# Patient Record
Sex: Female | Born: 1940 | Race: White | Hispanic: No | Marital: Married | Smoking: Never smoker
Health system: Southern US, Community
[De-identification: ages and names within clinical notes are randomized; demographics above are authoritative.]

## PROBLEM LIST (undated history)

## (undated) DIAGNOSIS — E119 Type 2 diabetes mellitus without complications: Secondary | ICD-10-CM

## (undated) DIAGNOSIS — R42 Dizziness and giddiness: Secondary | ICD-10-CM

## (undated) DIAGNOSIS — E559 Vitamin D deficiency, unspecified: Secondary | ICD-10-CM

## (undated) DIAGNOSIS — I10 Essential (primary) hypertension: Secondary | ICD-10-CM

## (undated) DIAGNOSIS — R7303 Prediabetes: Secondary | ICD-10-CM

## (undated) DIAGNOSIS — F419 Anxiety disorder, unspecified: Secondary | ICD-10-CM

## (undated) DIAGNOSIS — R159 Full incontinence of feces: Secondary | ICD-10-CM

## (undated) DIAGNOSIS — R002 Palpitations: Secondary | ICD-10-CM

## (undated) DIAGNOSIS — K219 Gastro-esophageal reflux disease without esophagitis: Secondary | ICD-10-CM

## (undated) DIAGNOSIS — K589 Irritable bowel syndrome without diarrhea: Secondary | ICD-10-CM

## (undated) DIAGNOSIS — E739 Lactose intolerance, unspecified: Secondary | ICD-10-CM

## (undated) DIAGNOSIS — A0472 Enterocolitis due to Clostridium difficile, not specified as recurrent: Secondary | ICD-10-CM

## (undated) HISTORY — DX: Irritable bowel syndrome, unspecified: K58.9

## (undated) HISTORY — DX: Palpitations: R00.2

## (undated) HISTORY — PX: ABDOMINAL HYSTERECTOMY: SUR658

## (undated) HISTORY — DX: Anxiety disorder, unspecified: F41.9

## (undated) HISTORY — PX: CHOLECYSTECTOMY: SHX55

## (undated) HISTORY — DX: Full incontinence of feces: R15.9

## (undated) HISTORY — DX: Type 2 diabetes mellitus without complications: E11.9

## (undated) HISTORY — PX: ABDOMINAL HYSTERECTOMY: SHX81

## (undated) HISTORY — DX: Essential (primary) hypertension: I10

## (undated) HISTORY — PX: KNEE ARTHROSCOPY: SHX127

## (undated) HISTORY — DX: Enterocolitis due to Clostridium difficile, not specified as recurrent: A04.72

## (undated) SURGERY — COLONOSCOPY WITH PROPOFOL
Anesthesia: Monitor Anesthesia Care

---
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
## 1998-03-19 ENCOUNTER — Ambulatory Visit (HOSPITAL_COMMUNITY): Admission: RE | Admit: 1998-03-19 | Discharge: 1998-03-19 | Payer: Self-pay | Admitting: *Deleted

## 1998-10-16 ENCOUNTER — Other Ambulatory Visit: Admission: RE | Admit: 1998-10-16 | Discharge: 1998-10-16 | Payer: Self-pay | Admitting: *Deleted

## 1999-03-29 ENCOUNTER — Encounter (INDEPENDENT_AMBULATORY_CARE_PROVIDER_SITE_OTHER): Payer: Self-pay | Admitting: Specialist

## 1999-03-29 ENCOUNTER — Ambulatory Visit (HOSPITAL_COMMUNITY): Admission: RE | Admit: 1999-03-29 | Discharge: 1999-03-29 | Payer: Self-pay | Admitting: *Deleted

## 1999-09-16 ENCOUNTER — Ambulatory Visit (HOSPITAL_COMMUNITY): Admission: RE | Admit: 1999-09-16 | Discharge: 1999-09-16 | Payer: Self-pay | Admitting: *Deleted

## 1999-09-16 ENCOUNTER — Encounter: Payer: Self-pay | Admitting: *Deleted

## 1999-10-16 ENCOUNTER — Other Ambulatory Visit: Admission: RE | Admit: 1999-10-16 | Discharge: 1999-10-16 | Payer: Self-pay | Admitting: *Deleted

## 2000-10-06 ENCOUNTER — Other Ambulatory Visit: Admission: RE | Admit: 2000-10-06 | Discharge: 2000-10-06 | Payer: Self-pay | Admitting: *Deleted

## 2001-01-06 ENCOUNTER — Ambulatory Visit (HOSPITAL_COMMUNITY): Admission: RE | Admit: 2001-01-06 | Discharge: 2001-01-06 | Payer: Self-pay | Admitting: *Deleted

## 2001-01-06 ENCOUNTER — Encounter: Payer: Self-pay | Admitting: *Deleted

## 2006-11-04 ENCOUNTER — Ambulatory Visit (HOSPITAL_COMMUNITY): Admission: RE | Admit: 2006-11-04 | Discharge: 2006-11-04 | Payer: Self-pay | Admitting: Family Medicine

## 2007-05-27 ENCOUNTER — Encounter (HOSPITAL_COMMUNITY): Admission: RE | Admit: 2007-05-27 | Discharge: 2007-06-01 | Payer: Self-pay | Admitting: Family Medicine

## 2007-07-16 ENCOUNTER — Ambulatory Visit: Payer: Self-pay | Admitting: Cardiology

## 2008-07-19 ENCOUNTER — Ambulatory Visit (HOSPITAL_COMMUNITY): Admission: RE | Admit: 2008-07-19 | Discharge: 2008-07-19 | Payer: Self-pay | Admitting: Family Medicine

## 2008-09-01 HISTORY — PX: COLONOSCOPY: SHX174

## 2009-03-29 ENCOUNTER — Encounter: Payer: Self-pay | Admitting: Cardiology

## 2009-03-30 ENCOUNTER — Ambulatory Visit: Payer: Self-pay | Admitting: Cardiology

## 2009-04-05 ENCOUNTER — Telehealth: Payer: Self-pay | Admitting: Cardiology

## 2009-04-24 ENCOUNTER — Telehealth: Payer: Self-pay | Admitting: Cardiology

## 2009-04-30 DIAGNOSIS — R002 Palpitations: Secondary | ICD-10-CM | POA: Insufficient documentation

## 2009-05-01 ENCOUNTER — Ambulatory Visit: Payer: Self-pay | Admitting: Cardiology

## 2009-05-06 DIAGNOSIS — K589 Irritable bowel syndrome without diarrhea: Secondary | ICD-10-CM | POA: Insufficient documentation

## 2009-05-06 DIAGNOSIS — R0789 Other chest pain: Secondary | ICD-10-CM | POA: Insufficient documentation

## 2009-05-06 DIAGNOSIS — F411 Generalized anxiety disorder: Secondary | ICD-10-CM | POA: Insufficient documentation

## 2009-05-08 ENCOUNTER — Ambulatory Visit: Payer: Self-pay | Admitting: Cardiology

## 2009-05-11 ENCOUNTER — Encounter: Payer: Self-pay | Admitting: Cardiology

## 2009-07-09 ENCOUNTER — Ambulatory Visit (HOSPITAL_COMMUNITY): Admission: RE | Admit: 2009-07-09 | Discharge: 2009-07-09 | Payer: Self-pay | Admitting: General Surgery

## 2009-07-09 ENCOUNTER — Encounter (INDEPENDENT_AMBULATORY_CARE_PROVIDER_SITE_OTHER): Payer: Self-pay | Admitting: General Surgery

## 2009-09-24 ENCOUNTER — Ambulatory Visit: Payer: Self-pay | Admitting: Cardiology

## 2009-10-07 ENCOUNTER — Telehealth (INDEPENDENT_AMBULATORY_CARE_PROVIDER_SITE_OTHER): Payer: Self-pay | Admitting: Physician Assistant

## 2009-10-08 ENCOUNTER — Telehealth (INDEPENDENT_AMBULATORY_CARE_PROVIDER_SITE_OTHER): Payer: Self-pay | Admitting: *Deleted

## 2009-10-08 ENCOUNTER — Ambulatory Visit: Payer: Self-pay | Admitting: Cardiology

## 2009-10-09 ENCOUNTER — Encounter: Payer: Self-pay | Admitting: Cardiology

## 2010-06-25 ENCOUNTER — Ambulatory Visit: Payer: Self-pay | Admitting: Internal Medicine

## 2010-07-02 ENCOUNTER — Encounter: Payer: Self-pay | Admitting: Cardiology

## 2010-07-02 ENCOUNTER — Ambulatory Visit (HOSPITAL_COMMUNITY): Admission: RE | Admit: 2010-07-02 | Discharge: 2010-07-02 | Payer: Self-pay | Admitting: Internal Medicine

## 2010-07-24 ENCOUNTER — Ambulatory Visit (HOSPITAL_COMMUNITY): Admission: RE | Admit: 2010-07-24 | Discharge: 2010-07-24 | Payer: Self-pay | Admitting: Family Medicine

## 2010-09-19 ENCOUNTER — Ambulatory Visit
Admission: RE | Admit: 2010-09-19 | Discharge: 2010-09-19 | Payer: Self-pay | Source: Home / Self Care | Attending: Cardiology | Admitting: Cardiology

## 2010-09-19 ENCOUNTER — Encounter: Payer: Self-pay | Admitting: Cardiology

## 2010-09-21 ENCOUNTER — Encounter: Payer: Self-pay | Admitting: *Deleted

## 2010-10-03 ENCOUNTER — Encounter: Payer: Self-pay | Admitting: Cardiology

## 2010-10-03 NOTE — Progress Notes (Signed)
  Phone Note Call from Patient   Call For: Dr Lewayne Bunting Reason for Call: Talk to Doctor Summary of Call: Pt had dizzyness upon standing up. Sounds orthostatic but pt has eaten and denies decreased fluid intake. She has not had Propanolol today. Checked her BP/HR and SBP 153 w/ HR 70s. Advised her she could take Rx early and recheck BP in 3 hours - call back if no better. She has Lisinopril 10mg  tabs but has never taken them. Can use this if more BP control needed. Left msg w/ Eden to call her. Initial call taken by: Park Breed PA-C,  October 07, 2009 2:17 PM

## 2010-10-03 NOTE — Assessment & Plan Note (Signed)
Summary: 1 yr ful   Visit Type:  Follow-up Primary Provider:  Margo Common   History of Present Illness: Rhonda Andrews is a 70 year old female with history of palpitations and irritable bowel syndrome. Cardiac standpoint she is doing stable. At one point last year we've referred Rhonda Andrews to [facility] for evaluation of her IBS but she canceled Rhonda appointment because her symptoms temporarily improved. However now she is reporting again symptoms of frequent diarrhea abdominal bloating, tightness and pressure. She has been using Lomotil with moderate success. She has been evaluated with anti-endomysial antibodies and ruled out for celiac disease. Rhonda Andrews however reports to me that she has noted that dairy products dramatically worsened her diarrhea. Rhonda Andrews in Rhonda interim had an EGD done and colonoscopy as well as an abdominal CT scan all which were within normal limits. Unfortunately she has lost quite a bit of weight. She is wondering if she should go on a lactose-free diet. She has not had any formal testing for lactose intolerance.  From a cardiac standpoint she is actually doing quite well. She denies any chest pain shortness of breath orthopnea or PND.  Preventive Screening-Counseling & Management  Alcohol-Tobacco     Smoking Status: never  Current Medications (verified): 1)  Propranolol Hcl Cr 60 Mg Xr24h-Cap (Propranolol Hcl) .... Take 1 Tablet By Mouth Once A Day 2)  Caltrate 600+d Plus 600-400 Mg-Unit Tabs (Calcium Carbonate-Vit D-Min) .... Take 1 Tablet By Mouth Once A Day 3)  Childrens Multivitamins W/extra C & Fa Chew (Pediatric Multi Vit-Extra C-Fa) .... Take 2 Tablet By Mouth Once A Day 4)  Pantoprazole Sodium 40 Mg Tbec (Pantoprazole Sodium) .... Take 1 Tablet By Mouth Once A Day On Empty Stomach 5)  Diphenoxylate-Atropine 2.5-0.025 Mg/44ml Liqd (Diphenoxylate-Atropine) .... Take 2 Tablet By Mouth Two Times A Day As Needed  Allergies (verified): No Known Drug  Allergies  Comments:  Nurse/Medical Assistant: Rhonda Andrews's medication list and allergies were reviewed with Rhonda Andrews and were updated in Rhonda Medication and Allergy Lists.  Past History:  Past Medical History: Last updated: 09/24/2009 PALPITATIONS (ICD-785.1) irritable bowel syndrome status post cholecystectomy anxiety diarrhea  Family History: Last updated: 05/06/2009 noncontributory  Social History: Last updated: 04/30/2009 Tobacco Use - No.  Alcohol Use - no Drug Use - no Full Time Married  Regular Exercise - yes  Risk Factors: Smoking Status: never (09/19/2010)  Review of Systems       Rhonda Andrews complains of nausea and diarrhea.  Rhonda Andrews denies fatigue, malaise, fever, weight gain/loss, vision loss, decreased hearing, hoarseness, chest pain, palpitations, shortness of breath, prolonged cough, wheezing, sleep apnea, coughing up blood, abdominal pain, blood in stool, vomiting, heartburn, incontinence, blood in urine, muscle weakness, joint pain, leg swelling, rash, skin lesions, headache, fainting, dizziness, depression, anxiety, enlarged lymph nodes, easy bruising or bleeding, and environmental allergies.    Vital Signs:  Andrews profile:   70 year old female Height:      64 inches Weight:      131 pounds BMI:     22.57 Pulse rate:   61 / minute BP sitting:   124 / 77  (left arm) Cuff size:   regular  Vitals Entered By: Carlye Grippe (September 19, 2010 10:27 AM)  Physical Exam  Additional Exam:  General: somewhat cachectic appearing white female head: Normocephalic and atraumatic eyes PERRLA/EOMI intact, conjunctiva and lids normal nose: No deformity or lesions mouth normal dentition, normal posterior pharynx neck: Supple, no JVD.  No masses,  thyromegaly or abnormal cervical nodes lungs: Normal breath sounds bilaterally without wheezing.  Normal percussion heart: regular rate and rhythm with normal S1 and S2, no S3 or S4.  PMI is normal.  No  pathological murmurs abdomen: Normal bowel sounds, abdomen is soft and nontender without masses, organomegaly or hernias noted.  No hepatosplenomegaly musculoskeletal: Back normal, normal gait muscle strength and tone normal pulsus: Pulse is normal in all 4 extremities Extremities: No peripheral pitting edema neurologic: Alert and oriented x 3 skin: Intact without lesions or rashes cervical nodes: No significant adenopathy psychologic: Normal affect    EKG  Procedure date:  09/19/2010  Findings:      normal sinus rhythm left axis deviation nonspecific ST-T wave changes heart rate 57 beats per minute  Impression & Recommendations:  Problem # 1:  IBS (ICD-564.1) Rhonda Andrews has recurrent symptoms of significant diarrhea. Very products seem to make her symptoms worse. She has moderate improvement with Lomotil use. Is very likely that Rhonda Andrews has lactose intolerance and I have referred her for a lactose breathing test at [facility]. In addition she also needs to be screened for small bowel bacterial overgrowth given her symptomatology and she will be referred for hydrogen breath test/methane test. In Rhonda interim I recommended to Rhonda Andrews to try to start taking Iberogast.  Problem # 2:  CHEST PAIN, ATYPICAL (ICD-786.59) No recurrent chest pain. No further cardiac evaluation is indicated. EKG was reviewed and demonstrates sinus bradycardia otherwise no acute changes. Her updated medication list for this problem includes:    Propranolol Hcl Cr 60 Mg Xr24h-cap (Propranolol hcl) .Marland Kitchen... Take 1 tablet by mouth once a day  Orders: EKG w/ Interpretation (93000)  Problem # 3:  PALPITATIONS (ICD-785.1) quiesced on on her Pravachol therapy and given a refill for this Andrews Her updated medication list for this problem includes:    Propranolol Hcl Cr 60 Mg Xr24h-cap (Propranolol hcl) .Marland Kitchen... Take 1 tablet by mouth once a day  Orders: EKG w/ Interpretation (93000)  Andrews  Instructions: 1)  Hydrogen breath test & lactose breath test at [facility] first available 2)  Follow up in  3 months

## 2010-10-03 NOTE — Assessment & Plan Note (Signed)
Summary: 3 MO FU PER DEC REMINDER-SRS   Visit Type:  Follow-up Primary Provider:  Margo Common  CC:  follow-up visit.  History of Present Illness: the patient is a 70 year old female with a history of palpitations and irritable bowel syndrome.  From a cardiac standpoint she is stable.  She reports no recurrent palpitations, shortness of breath orthopnea or PND.  Due to ongoing diarrhea and some abdominal pain the patient underwent cholecystectomy but without improvement in her symptoms.  She continues to have significant diarrhea.  She has been started on cholestyramine.  She is questioning whether she could take propranolol together with cholestyramine.  Clinical Review Panels:  CXR CXR results There is hyperinflation of the lungs compatible with         COPD.  There is mild cardiomegaly.  No focal airspace opacities or         effusions.  No acute bony abnormality.                   IMPRESSION:         Mild COPD, cardiomegaly.                   No acute findings. (03/29/2009)    Preventive Screening-Counseling & Management  Alcohol-Tobacco     Smoking Status: never  Current Medications (verified): 1)  Propranolol Hcl Cr 60 Mg Xr24h-Cap (Propranolol Hcl) .... Take 1 Tablet By Mouth Once A Day 2)  Multi Mega Minerals  Tabs (Multiple Minerals-Vitamins) .... Take 1 Tablet By Mouth Two Times A Day 3)  Cephalexin 500 Mg Caps (Cephalexin) .... Take 1 Tablet By Mouth Three Times A Day 4)  Daily Vitamins For Women  Tabs (Multiple Vitamins-Calcium) .... Take 1 Tablet By Mouth Once A Day  Allergies (verified): No Known Drug Allergies  Comments:  Nurse/Medical Assistant: The patient's medications and allergies were reviewed with the patient and were updated in the Medication and Allergy Lists. bottles reviewed.  Past History:  Past Medical History: PALPITATIONS (ICD-785.1) irritable bowel syndrome status post cholecystectomy anxiety diarrhea  Review of Systems       The patient  complains of diarrhea.  The patient denies fatigue, malaise, fever, weight gain/loss, vision loss, decreased hearing, hoarseness, chest pain, palpitations, shortness of breath, prolonged cough, wheezing, sleep apnea, coughing up blood, abdominal pain, blood in stool, nausea, vomiting, heartburn, incontinence, blood in urine, muscle weakness, joint pain, leg swelling, rash, skin lesions, headache, fainting, dizziness, depression, anxiety, enlarged lymph nodes, easy bruising or bleeding, and environmental allergies.    Vital Signs:  Patient profile:   70 year old female Height:      64 inches Weight:      136 pounds Pulse rate:   73 / minute BP sitting:   127 / 83  (left arm) Cuff size:   regular  Vitals Entered By: Carlye Grippe (September 24, 2009 9:19 AM) CC: follow-up visit   Physical Exam  Additional Exam:  General: Well-developed, well-nourished in no distress head: Normocephalic and atraumatic eyes PERRLA/EOMI intact, conjunctiva and lids normal nose: No deformity or lesions mouth normal dentition, normal posterior pharynx neck: Supple, no JVD.  No masses, thyromegaly or abnormal cervical nodes lungs: Normal breath sounds bilaterally without wheezing.  Normal percussion heart: regular rate and rhythm with normal S1 and S2, no S3 or S4.  PMI is normal.  No pathological murmurs abdomen: Normal bowel sounds, abdomen is soft and nontender without masses, organomegaly or hernias noted.  No hepatosplenomegaly musculoskeletal: Back normal,  normal gait muscle strength and tone normal pulsus: Pulse is normal in all 4 extremities Extremities: No peripheral pitting edema neurologic: Alert and oriented x 3 skin: Intact without lesions or rashes cervical nodes: No significant adenopathy psychologic: Normal affect    Impression & Recommendations:  Problem # 1:  IBS (ICD-564.1) the patient continues to have symptoms consistent with irritable bowel syndrome.  She continues to have  diarrhea.  She had no improvement in her symptoms after cholecystectomy.  I will refer to patient to the functional bowel disease clinic at [facility] with Dr. Almyra Deforest Orders: Misc. Referral (Misc. Ref)  Problem # 2:  CHEST PAIN, ATYPICAL (ICD-786.59) the patient has atypical chest pain.  There is no evidence of angina.  At the present time no ischemia workup is needed. Her updated medication list for this problem includes:    Propranolol Hcl Cr 60 Mg Xr24h-cap (Propranolol hcl) .Marland Kitchen... Take 1 tablet by mouth once a day  Problem # 3:  PALPITATIONS (ICD-785.1)  Her updated medication list for this problem includes:    Propranolol Hcl Cr 60 Mg Xr24h-cap (Propranolol hcl) .Marland Kitchen... Take 1 tablet by mouth once a day  Problem # 4:  ANXIETY (ICD-300.00)  Patient Instructions: 1)  Referral to Dr. Almyra Deforest - GI Clinic 2)  Follow up in  1 year.   Prescriptions: PROPRANOLOL HCL CR 60 MG XR24H-CAP (PROPRANOLOL HCL) Take 1 tablet by mouth once a day  #30 x 6   Entered by:   Hoover Brunette, LPN   Authorized by:   Lewayne Bunting, MD, [facility]   Signed by:   Hoover Brunette, LPN on 04/54/0981   Method used:   Electronically to        [facility]* (retail)       [address]       [place], [place]  [address]       Ph: [phone]       Fax: [phone]   RxID:   [id number]

## 2010-10-03 NOTE — Letter (Signed)
Summary: External Correspondence/ REFERRAL [facility]  External Correspondence/ RE[facility]ERRAL [facility]   Imported By: Dorise Hiss 11/02/2009 12:35:42  _____________________________________________________________________  External Attachment:    Type:   Image     Comment:   External Document

## 2010-10-03 NOTE — Assessment & Plan Note (Signed)
Summary: bp check  Nurse Visit  See phone note regarding dizziness.   Vital Signs:  Patient profile:   70 year old female Height:      64 inches Weight:      134 pounds Pulse rate:   82 / minute Pulse (ortho):   92 / minute BP sitting:   137 / 77  (left arm) BP standing:   136 / 82 Cuff size:   regular  Vitals Entered By: Carlye Grippe (October 08, 2009 1:11 PM)  CC:Follow-up HTN--no BJY:NWGNFA meds?--yes Side effects?--no Chest pain, SOB, Dizziness?--c/o lightheadedness A/P: 1. HTN (401.1)             At goal?              If no, physician will be notified.              Follow up in ...Marland KitchenMarland KitchenMarland Kitchen  5 minutes was spent with the patient.     Serial Vital Signs/Assessments:  Time      Position  BP       Pulse  Resp  Temp     By 1:58 PM   Lying LA  124/80   74                    Lydia Anderson 1:58 PM   Sitting   129/85   88                    Lydia Anderson 1:58 PM   Standing  136/82   92                    Lydia Anderson 2:00pm    Standing  132/86   96                    Carlye Grippe 2:05pm    Standing  122/83   103                   Carlye Grippe   Visit Type:  Follow-up  CC:  nurse BP check per Theodore Demark for lightheadedness.  CC: nurse BP check per Theodore Demark for lightheadedness   Preventive Screening-Counseling & Management  Alcohol-Tobacco     Smoking Status: never  Allergies: No Known Drug Allergies  Orders Added: 1)  Est. Patient Level I [21308]  Patient is orthostatic, please encourage fluid intake with Gatorade. Recheck orthostatics in a week. Lewayne Bunting, MD, [facility]  October 12, 2009 8:40 AM   Patient notified.   States she did go to PMD also and he told her that she was dehydrated due to everything she eats going right through her.  Did advise to take some type of powder that GI MD had recommended.  Did encourage her to drink G-2 gatorade to help replace electrolytes.  States she will be seeing GI MD soon at [facility] for her IBS.   Also, states that she will call back if feels like she needs to come back for orthostatics.  Hoover Brunette, LPN  October 12, 2009 3:00 PM

## 2010-10-03 NOTE — Progress Notes (Signed)
Summary: PATIENT DIZZY  Phone Note Other Incoming   Caller: RHONDA BARETT Summary of Call: RHONDA LEFT A MESSAGE ON OUR ANSWERING MACHINE TO CALL PATIENT.  SHE IS CONCERNED ABOUT HER BEING DIZZY Initial call taken by: Claudette Laws,  October 08, 2009 8:11 AM  Follow-up for Phone Call        schedule RV visit for orthostatics. if negative, probably should be addressed by LMD.  Lewayne Bunting, MD, [facility]  October 08, 2009 9:13 AM   Additional Follow-up for Phone Call Additional follow up Details #1::        Done by Isabelle Course today. Please review and advise. Additional Follow-up by: Cyril Loosen, RN, BSN,  October 08, 2009 4:56 PM

## 2010-10-09 NOTE — Letter (Signed)
Summary: External Correspondence/ FAXED [facility] REFERRAL  External Correspondence/ [facility]ED [facility] REFERRAL   Imported By: Dorise Hiss 10/03/2010 11:09:12  _____________________________________________________________________  External Attachment:    Type:   Image     Comment:   External Document

## 2010-10-09 NOTE — Miscellaneous (Signed)
Summary: Orders Update - [facility]   Clinical Lists Changes  Orders: Added new Referral order of Misc. Referral (Misc. Ref) - Signed

## 2010-10-23 ENCOUNTER — Encounter: Payer: Self-pay | Admitting: Cardiology

## 2010-11-06 ENCOUNTER — Telehealth: Payer: Self-pay | Admitting: *Deleted

## 2010-11-13 ENCOUNTER — Ambulatory Visit (INDEPENDENT_AMBULATORY_CARE_PROVIDER_SITE_OTHER): Payer: Medicare Other | Admitting: Cardiology

## 2010-11-13 ENCOUNTER — Encounter: Payer: Self-pay | Admitting: Cardiology

## 2010-11-13 DIAGNOSIS — R002 Palpitations: Secondary | ICD-10-CM

## 2010-11-13 DIAGNOSIS — K589 Irritable bowel syndrome without diarrhea: Secondary | ICD-10-CM

## 2010-11-19 NOTE — Assessment & Plan Note (Signed)
Summary: 3 MO F/U FH   Visit Type:  Follow-up Primary Provider:  Margo Common   History of Present Illness: The patient is a 70 year old female with history of palpitation and irritable bowel syndrome.  However the last visit she complained again of recurrent symptoms of diarrhea abdominal bloating tightness and pressure.  She had been using Lomotil with moderate success.  She also had an EGD done on colonoscopy as well as an abdominal CT scan.  She has lost quite a bit of weight. The patient actually turned out to a positive hydrogen breath test for bacterial overgrowth.  The patient also had a GI motility test done.  I do not see the results on the lactose intolerance test.  The patient presents for follow-up.   Preventive Screening-Counseling & Management  Alcohol-Tobacco     Smoking Status: never  Current Medications (verified): 1)  Propranolol Hcl Cr 60 Mg Xr24h-Cap (Propranolol Hcl) .... Take 1 Tablet By Mouth Once A Day 2)  Caltrate 600+d Plus 600-400 Mg-Unit Tabs (Calcium Carbonate-Vit D-Min) .... Take 1 Tablet By Mouth Once A Day 3)  Childrens Multivitamins W/extra C & Fa Chew (Pediatric Multi Vit-Extra C-Fa) .... Take 2 Tablet By Mouth Once A Day 4)  Pantoprazole Sodium 40 Mg Tbec (Pantoprazole Sodium) .... Take 1 Tablet By Mouth Once A Day On Empty Stomach 5)  Diphenoxylate-Atropine 2.5-0.025 Mg/13ml Liqd (Diphenoxylate-Atropine) .... Take 2 Tablet By Mouth Two Times A Day As Needed 6)  Xifaxan 550 Mg Tabs (Rifaximin) .... Take 1 Tablet By Mouth Three Times A Day X 2 Weeks 7)  Augmentin 875-125 Mg Tabs (Amoxicillin-Pot Clavulanate) .... Take 1 Tablet By Mouth Two Times A Day X 10 8)  Flagyl 500 Mg Tabs (Metronidazole) .... Take 1 Tablet By Mouth Two Times A Day X 10 Days  Allergies (verified): No Known Drug Allergies  Comments:  Nurse/Medical Assistant: The patient's medication list and allergies were reviewed with the patient and were updated in the Medication and Allergy  Lists.  Past History:  Past Medical History: Last updated: 09/24/2009 PALPITATIONS (ICD-785.1) irritable bowel syndrome status post cholecystectomy anxiety diarrhea  Family History: Last updated: 05/06/2009 noncontributory  Social History: Last updated: 04/30/2009 Tobacco Use - No.  Alcohol Use - no Drug Use - no Full Time Married  Regular Exercise - yes  Risk Factors: Smoking Status: never (11/13/2010)  Review of Systems       The patient complains of diarrhea.  The patient denies fatigue, malaise, fever, weight gain/loss, vision loss, decreased hearing, hoarseness, chest pain, palpitations, shortness of breath, prolonged cough, wheezing, sleep apnea, coughing up blood, abdominal pain, blood in stool, nausea, vomiting, heartburn, incontinence, blood in urine, muscle weakness, joint pain, leg swelling, rash, skin lesions, headache, fainting, dizziness, depression, anxiety, enlarged lymph nodes, easy bruising or bleeding, and environmental allergies.         bloating and cramping as well as diarrhea  Vital Signs:  Patient profile:   70 year old female Height:      64 inches Weight:      133 pounds Pulse rate:   61 / minute BP sitting:   145 / 78  (left arm) Cuff size:   regular  Vitals Entered By: Carlye Grippe (November 13, 2010 8:36 AM)  Physical Exam  Additional Exam:  General: somewhat cachectic appearing white female head: Normocephalic and atraumatic eyes PERRLA/EOMI intact, conjunctiva and lids normal nose: No deformity or lesions mouth normal dentition, normal posterior pharynx neck: Supple, no JVD.  No masses,  thyromegaly or abnormal cervical nodes lungs: Normal breath sounds bilaterally without wheezing.  Normal percussion heart: regular rate and rhythm with normal S1 and S2, no S3 or S4.  PMI is normal.  No pathological murmurs abdomen: Normal bowel sounds, abdomen is soft and nontender without masses, organomegaly or hernias noted.  No  hepatosplenomegaly musculoskeletal: Back normal, normal gait muscle strength and tone normal pulsus: Pulse is normal in all 4 extremities Extremities: No peripheral pitting edema neurologic: Alert and oriented x 3 skin: Intact without lesions or rashes cervical nodes: No significant adenopathy psychologic: Normal affect    Impression & Recommendations:  Problem # 1:  IBS (ICD-564.1) IBS: Associated with diarrhea bloating and cramping. positive hydrogen breath test: The patient has a positive hydrogen breath test.  She will be treated with either rifaximin or Augmentin in combination with Flagyl.  She will check with her insurance company. rule out lactose intolerance: The patient has lactose intolerance and does appear to lactate free diet but she remains symptomatic.  Problem # 2:  PALPITATIONS (ICD-785.1) resolved Her updated medication list for this problem includes:    Propranolol Hcl Cr 60 Mg Xr24h-cap (Propranolol hcl) .Marland Kitchen... Take 1 tablet by mouth once a day  Patient Instructions: 1)  Rifaximin 550mg  three times a day x 2 weeks  2)  If insurance will not approve above medication, may take Augmentin 875/125 every 12 hours x 10 days & Flagyl 500mg  every 12 hours x 10 days 3)  Follow up in  6 months Prescriptions: FLAGYL 500 MG TABS (METRONIDAZOLE) Take 1 tablet by mouth two times a day x 10 days  #20 x 0   Entered by:   Hoover Brunette, LPN   Authorized by:   Lewayne Bunting, MD, [facility]   Signed by:   Hoover Brunette, LPN on 11/91/4782   Method used:   Print then Give to Patient   RxID:   [id number] AUGMENTIN 875-125 MG TABS (AMOXICILLIN-POT CLAVULANATE) Take 1 tablet by mouth two times a day x 10  #20 x 0   Entered by:   Hoover Brunette, LPN   Authorized by:   Lewayne Bunting, MD, [facility]   Signed by:   Hoover Brunette, LPN on 29/52/8413   Method used:   Print then Give to Patient   RxID:   [id number] XIFAXAN 550 MG TABS (RIFAXIMIN) Take 1 tablet by mouth three times a day x 2 weeks  #45 x 0    Entered by:   Hoover Brunette, LPN   Authorized by:   Lewayne Bunting, MD, [facility]   Signed by:   Hoover Brunette, LPN on 34/74/2595   Method used:   Print then Give to Patient   RxID:   [id number]

## 2010-11-19 NOTE — Progress Notes (Signed)
Summary: [facility] results       Phone Note Outgoing Call Call back at cell:  [phone]   Summary of Call: Had motility testing 2 weeks ago - requesting test results.     Placed call to [facility] lab requesting results.  Initial call taken by: Hoover Brunette, LPN,  November 06, 2010 11:10 AM  Follow-up for Phone Call        Called again to request test results, advised them that patient does have OV with GD in the a.m.  [phone] Follow-up by: Hoover Brunette, LPN,  November 12, 2010 4:17 PM  Additional Follow-up for Phone Call Additional follow up Details #1::        Received this morning and discussed with patient during OV today.  Additional Follow-up by: Hoover Brunette, LPN,  November 13, 2010 9:43 AM

## 2010-11-19 NOTE — Letter (Signed)
Summary: External Correspondence/  [facility]   External Correspondence/  [facility]   Imported By: Dorise Hiss 11/13/2010 08:11:50  _____________________________________________________________________  External Attachment:    Type:   Image     Comment:   External Document

## 2010-11-25 ENCOUNTER — Other Ambulatory Visit: Payer: Self-pay | Admitting: *Deleted

## 2010-11-25 DIAGNOSIS — R002 Palpitations: Secondary | ICD-10-CM

## 2010-11-25 MED ORDER — PROPRANOLOL HCL 60 MG PO TABS: 60.0000 mg | ORAL_TABLET | Freq: Every day | ORAL | Status: DC

## 2010-11-27 ENCOUNTER — Telehealth: Payer: Self-pay | Admitting: *Deleted

## 2010-11-27 NOTE — Telephone Encounter (Signed)
Please file in med record  ---------- Forwarded message ---------- From: Peyton Bottoms [email or web address]> Date: Mon, Nov 25, 2010 at 2:22 PM Subject: Re: update on Rhonda Andrews Reason To: Johnny Bridge Patt [email or web address]>   Seira , thats very unusual, but yes stop the pills for now and schedule appointment with Dr. Karilyn Cota. I doubt this is related to pills, but possible.  Let me know how you feel in next few days. You can call me at [phone] also.  Alvin Critchley Gent,MD    On Mon, Nov 25, 2010 at 2:13 PM, Kasy Iannacone [email or web address]> wrote: Just wanted to update you on my taking Xifaxan. I was able to get 28 pills of which I take 2 a day. I began taking this Thursday night and I was doing just fine until today and I have been to the bathroom 4 times so far today. I read the side effects and was concerned. Do I keep taking or not? I called Dr. Karilyn Cota and could not get an appointment until Thursday morning. I felt they needed to know about the test and the results.  Sorry to bother you but please advise.  Thanks,  Rhonda Andrews Reason    --  Alvin Critchley [facility]  Board Certified in Internal Medicine, Adult Cardiovascular Medicine and Critical Care Medicine  The information in this electronic mail is sensitive, protected information  intended only for the addressee(s). Any other person, including anyone who  believes he/she might have received it due to an addressing error, is requested  to notify the sender immediately and delete it without further reading or  retention. The information is not to be forwarded or shared unless in  compliance with [employer] on confidentiality and/or with the approval of  the sender.    --  Alvin Critchley [facility]  Board Certified in Internal Medicine, Adult Cardiovascular Medicine and Critical Care Medicine  The information in this electronic mail is sensitive, protected information  intended only for the addressee(s). Any  other person, including anyone who  believes he/she might have received it due to an addressing error, is requested  to notify the sender immediately and delete it without further reading or  retention. The information is not to be forwarded or shared unless in  compliance with [employer] on confidentiality and/or with the approval of  the sender.

## 2010-11-28 ENCOUNTER — Ambulatory Visit (INDEPENDENT_AMBULATORY_CARE_PROVIDER_SITE_OTHER): Payer: Medicare Other | Admitting: Internal Medicine

## 2010-11-28 DIAGNOSIS — R197 Diarrhea, unspecified: Secondary | ICD-10-CM

## 2010-12-04 LAB — BASIC METABOLIC PANEL
BUN: 23 mg/dL (ref 6–23)
CO2: 32 mEq/L (ref 19–32)
Glucose, Bld: 108 mg/dL — ABNORMAL HIGH (ref 70–99)
Potassium: 4.6 mEq/L (ref 3.5–5.1)
Sodium: 140 mEq/L (ref 135–145)

## 2010-12-04 LAB — CBC
HCT: 40.8 % (ref 36.0–46.0)
Hemoglobin: 14 g/dL (ref 12.0–15.0)
MCHC: 34.3 g/dL (ref 30.0–36.0)
MCV: 91.4 fL (ref 78.0–100.0)
Platelets: 243 10*3/uL (ref 150–400)
RDW: 13.6 % (ref 11.5–15.5)

## 2010-12-04 LAB — HEPATIC FUNCTION PANEL
Bilirubin, Direct: 0.1 mg/dL (ref 0.0–0.3)
Indirect Bilirubin: 0.3 mg/dL (ref 0.3–0.9)
Total Bilirubin: 0.4 mg/dL (ref 0.3–1.2)

## 2010-12-10 NOTE — Consult Note (Signed)
  Rhonda Andrews, Rhonda Andrews            ACCOUNT NO.:  [id number]  MEDICAL RECORD NO.:  [id number]           PATIENT TYPE: AMB.  LOCATION: [place].                   [facility]  PHYSICIAN:  Lionel December, M.D.    DATE OF BIRTH:  02-10-1941  DATE:  11/29/2010.                                OFFICE VISIT.   REASON FOR CONSULTATION:  Followup/office visit.  Rhonda Andrews is a 70- year-old female presenting today for followup.  She was last seen in our office in October 2011.  She complained of having frequent bloating and occasional epigastric tenderness.  Her symptoms are usually worse at night.  On May 15, 2009, she underwent an EGD and colonoscopy. The EGD revealed a 3 tiny hyperplastic-appearing gastric polyp, otherwise normal EGD.  The colonoscopy revealed a left-sided diverticulitis, normal terminal ileum, small external hemorrhoids.  She actually saw Dr. Andee Andrews and she was referred to [facility] and underwent a hydrogen breath test.  The hydrogen breath test was positive for bacterial overgrowth.  She was started on Xifaxan 500 mg 2 tablets a day for 14 days.  She took 4 days of that and she developed diarrhea. Since stopping the Xifaxan, she states she has no diarrhea and no bloating.  She says she feels pretty good.  She is concerned about the Xifaxan.  At present, she denies any diarrhea or bloating.  Her appetite is good.  There has been no weight loss.  HOME MEDICATIONS:  Include: 1. Maghemite one a day. 2. Maghemite Mineral twice a day 3. Prilosec OTC one a day. 4. Inderal LA 60 mg a day. 5. Multivitamin a day.  OBJECTIVE:  VITAL SIGNS:  Her weight is 133, her previous weight was 129, her blood pressure is 118/70, her temperature is 96.6, her height is 5 feet 4 inches, and her pulse is 72. HEENT:  Her conjunctiva is pink.  Her sclerae anicteric.  Her thyroid is normal.  There is no cervical lymphadenopathy. LUNGS:  Clear. HEART:  Regular rate and  rhythm. EXTREMITIES:  There is no edema to his extremities. ABDOMEN:  Soft.  Bowel sounds are positive.  No masses.  ASSESSMENT:  Rhonda Andrews is a 70 year old female presenting with complaints of bloating and diarrhea, which is resolved.  She does have bacterial overgrowth concern confirmed with a lab test.  RECOMMENDATIONS:  I did advise her to continue with Xifaxan 1 twice a day for 10 more days and she will call with a progress report in 2 weeks.    ______________________________ Dorene Ar, NP   ______________________________ Lionel December, M.D.    TS/MEDQ  D:  11/29/2010  T:  11/30/2010  Job:  [id number]  Electronically Signed by Dorene Ar PA on 12/05/2010 09:17:10 AM Electronically Signed by Lionel December M.D. on 12/10/2010 11:34:48 PM

## 2011-01-14 NOTE — Assessment & Plan Note (Signed)
[facility]                          [facility]ICE NOTE   Rhonda Andrews, Rhonda Andrews                   MRN:          [id number]  DATE:07/16/2007                            DOB:          1940-10-15    REFERRING PHYSICIAN:  Wyvonnia Lora, M.D.   REASON FOR CONSULTATION:  Cardiovascular examination.   HISTORY OF PRESENT ILLNESS:  The patient is a pleasant 70 year old  female with no prior history of coronary artery disease.  The patient  has essentially a few cardiac risk factors.  She does have a family  history of heart disease, but does not smoke and does not have diabetes  or hypertension and has no significant dyslipidemia.   The patient essentially wants to be checked for a thorough  cardiovascular examination.  She did have a [facility] examination done  and was noted to have no significant peripheral vascular disease.   The patient states that she is somewhat hampered by bad knees.  She,  however, has no substernal chest pain or shortness of breath upon  exertion.  She denies any palpitations or syncope.  She does report  episodes of feeling weak and less energy which can last for several  hours.  She has received a workup for hypoglycemic spells.  She has  increased her carbohydrates and her diet and these episodes have  somewhat improved.  She denies any cardiovascular related symptoms.   ALLERGIES:  No known drug allergies.   MEDICATIONS:  Calcium and magnesium.   SOCIAL HISTORY:  The patient lives in [place].  She works for [employer].  She denies any tobacco use.   FAMILY HISTORY:  Her mother is alive but has cancer.  Father died from  Wagner's granulomatosis.  She has two sisters, one with seizure disorder  and committed suicide.  She has another sister that has no significant  medical problems.   REVIEW OF SYSTEMS:  As per HPI.  No nausea and vomiting.  No fevers or  chills.  No melena, dysuria, or frequency.  No  palpitations or syncope.  No myalgias.  Positive for arthralgias in both knees.   PHYSICAL EXAMINATION:  VITAL SIGNS:  Blood pressure 123/73, heart rate  75 beats per minute.  GENERAL:  A well-nourished white female in no acute distress.  HEENT:  Pupils are clear.  NECK:  Supple, normal carotid upstroke, and no carotid bruits.  LUNGS:  Clear breath sounds bilaterally.  HEART:  Regular rate and rhythm with normal S1 and S2.  No murmurs or  gallops.  ABDOMEN:  Soft and nontender.  No rebound or guarding with good bowel  sounds.  EXTREMITIES:  No cyanosis, clubbing, or edema.  NEUROLOGY:  The patient is alert and oriented and grossly nonfocal.   PROBLEM LIST:  1. Weakness ruled out for hypoglycemia.  2. Pretest probability for cardiovascular disease.  3. Negative workup for peripheral vascular disease by [facility].   PLAN:  The patient will be screened for underlying coronary artery  disease although she has a very low pretest probability for CAD.  I have  ordered a dobutamine echocardiographic study  which will be performed  later next week.   The patient can follow up with [place] on as-needed basis, but if the stress  study is negative, I do think she can further adhere to a healthy  lifestyle and receive her primary care by Dr. Margo Common.     Learta Codding, MD,FACC  Electronically Signed    GED/MedQ  DD: 07/16/2007  DT: 07/17/2007  Job #: [id number]   cc:   Wyvonnia Lora

## 2011-01-14 NOTE — Assessment & Plan Note (Signed)
[facility]                          [facility] NOTE   Rhonda Andrews, Rhonda Andrews                   MRN:          [id number]  DATE:03/30/2009                            DOB:          01-29-1941    REFERRING PHYSICIAN:  Wyvonnia Lora   HISTORY OF PRESENT ILLNESS:  The patient is a 70 year old female last  seen by me in November 2008.  The patient was recently seen in the  emergency room for palpitations.  The patient has thought that when she  went to [facility] in June 2010, she started having problems with  short episodes initially of her heart racing.  Since returning from the  beach also, she has developed diarrhea with approximately 3-4 bowel  movements a day each time shortly after a meal.  When questioning the  patient about this, actually her symptoms of diarrhea go back many years  to her mid 74s, and she has recurrence of this on a regular basis.  Her  last episode was approximately 2 years ago.  The patient states that  when she presented to the emergency room, she has just suddenly felt  palpitations, but no nausea or vomiting and no dizziness.  There was no  shortness of breath.  The patient got to the emergency room, an EKG was  obtained, which only showed sinus tachycardia.  The highest heart rate  that she obtained at home was 112 beats per minute.  She states that the  episodes last about 2-3 hours.  The ER recommended that she will be  followed up the next day in our clinic.  We have seen the patient again  in 2008 for workup of cardiovascular disease.  I recommended at that  time a dobutamine echo, but the patient never had this study done.   MEDICATIONS:  1. Calcium, unknown dose b.i.d.  2. Magnesium, unknown dose daily.  No other medications.   PHYSICAL EXAMINATION:  VITAL SIGNS:  Blood pressure 148/89, heart rate  103 beats per minute, temperature is 99, weight is 143 pounds.  GENERAL:  Well-nourished white female in  no apparent distress.  HEENT:  Pupils anisocoric.  Conjunctiva clear.  NECK:  Supple, normal carotid upstroke, and no carotid bruits.  LUNGS:  Clear breath sounds bilaterally.  HEART:  Regular rate and rhythm with normal S1 and S2.  No murmur, rubs,  or gallops.  Tachycardic.  ABDOMEN:  Soft.  EXTREMITIES:  No cyanosis, clubbing, or edema.  NEUROLOGIC:  The patient is alert, oriented, and grossly nonfocal.   PROBLEM LIST:  1. Palpitations.  2. Diarrhea.  3. Low pretest probability for cardiovascular disease.   PLAN:  1. The patient's palpitations could well represent episodes of      anxiety.  I do not think she has a primary arrhythmia, but she will      have a CardioNet monitor for 21 days applied.  2. If the CardioNet monitor is abnormal with significant arrhythmias,      then we will also proceed with an echocardiographic study.  I do      not think at this  present time this is required.  3. 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  She has had a      workup many, many years ago for this.  However, her symptomatology      of bloating and postprandial diarrhea is very consistent with this      diagnosis.  She has not been screened for celiac disease, however.  4. I recommended to the patient that she start Align tablets one by      mouth daily as well as Beano tablets, alpha-D-galactosidase 2      tablets before each meal with vegetables.  She will also take      dicyclomine 20 mg p.o. t.i.d. 30 minutes before a meal to avoid      postprandial symptoms.  I also recommend that milk of thistle      capsules that she can take 3 times a day for bloating.  5. We also started in light of her hypertension and I suspect a      significant anxiety component, I will start her on propranolol LA      60 mg once daily.  6. The patient will return after her CardioNet monitor has been      completed for further recommendations if any.     Learta Codding, MD,FACC  Electronically  Signed    GED/MedQ  DD: 03/30/2009  DT: 03/31/2009  Job #: [id number]   cc:   Wyvonnia Lora

## 2011-02-06 ENCOUNTER — Ambulatory Visit (INDEPENDENT_AMBULATORY_CARE_PROVIDER_SITE_OTHER): Payer: Medicare Other | Admitting: Internal Medicine

## 2011-02-06 DIAGNOSIS — R112 Nausea with vomiting, unspecified: Secondary | ICD-10-CM

## 2011-06-09 ENCOUNTER — Ambulatory Visit (INDEPENDENT_AMBULATORY_CARE_PROVIDER_SITE_OTHER): Payer: Medicare Other | Admitting: Cardiology

## 2011-06-09 ENCOUNTER — Encounter: Payer: Self-pay | Admitting: Cardiology

## 2011-06-09 VITALS — BP 123/75 | HR 89 | Resp 18 | Ht 64.0 in | Wt 134.0 lb

## 2011-06-09 DIAGNOSIS — R002 Palpitations: Secondary | ICD-10-CM

## 2011-06-09 DIAGNOSIS — K589 Irritable bowel syndrome without diarrhea: Secondary | ICD-10-CM

## 2011-06-09 NOTE — Assessment & Plan Note (Signed)
No recurrent palpitations. No further cardiac workup needed. Continue low-dose propranolol

## 2011-06-09 NOTE — Patient Instructions (Signed)
Continue all current medications. Your physician wants you to follow up in:  1 year.  You will receive a reminder letter in the mail one-two months in advance.  If you don't receive a letter, please call our office to schedule the follow up appointment   

## 2011-06-09 NOTE — Assessment & Plan Note (Signed)
Status post refax amine therapy with resolution of her abdominal symptoms including bloating and abdominal pain. Patient was given recommendation to follow a FODMAP diet.

## 2011-06-09 NOTE — Progress Notes (Signed)
History of present illness: The patient is a 70 year old female with a history of palpitations and irritable bowel syndrome. The patient had a positive breath test for bacterial overgrowth consistent with SIBO and also a positive lactose intolerance test . The patient was treated with refax amine and has done very well with resolution of her symptoms of bloating and abdominal pain. She also hears a lactose free diet. From a cardiac standpoint is doing quite well. She denies any recurrent palpitations. She denies any chest pains or shortness of breath. She reports no anxiety.   Allergies, family history and social history: As documented in chart and reviewed  Medications: Documented and reviewed in chart.  Past medical history: Reviewed and see problem list below   Review of systems:No nausea or vomiting, no fever or chills no melena hematochezia. No abdominal pain no bloating. No cardiac symptoms   Physical examination : Vital signs documented below General:Well-nourished white female in no distress  HEENT:Normal carotid upstroke no carotid bruits, no thyromegaly nonnodular thyroid. EOMI PERRLA  Lungs:Clear breath sounds bilaterally no wheezing  Heart:Regular rate and rhythm with normal S1-S2 no murmur rubs or gallops  Abdomen:Soft nontender no rebound or guarding and good bowel sounds  Extremity exam:No cyanosis or clubbing or edema  Neurologic:Alert and oriented grossly nonfocal  Psychiatric:Normal affect  Vascular exam:Normal distal pulses

## 2011-06-24 ENCOUNTER — Other Ambulatory Visit: Payer: Self-pay | Admitting: Cardiology

## 2011-06-24 NOTE — Telephone Encounter (Signed)
Patient requesting refill on the Refaxamine given to her a couple months back.  States stomach has been bothering her since last OV (10/8).  Has seen Dorene Ar, PA a couple times, but not since June.  Stated that her insurance would only pay for the 200mg , not the 550mg .  Advised her that message will be sent to MD for review, but he may advise follow up with GI.  She verbalized understanding.

## 2011-07-02 ENCOUNTER — Encounter (INDEPENDENT_AMBULATORY_CARE_PROVIDER_SITE_OTHER): Payer: Self-pay | Admitting: Internal Medicine

## 2011-07-02 ENCOUNTER — Ambulatory Visit (INDEPENDENT_AMBULATORY_CARE_PROVIDER_SITE_OTHER): Payer: Medicare Other | Admitting: Internal Medicine

## 2011-07-02 VITALS — BP 120/68 | HR 66 | Temp 97.6°F | Ht 64.0 in | Wt 131.4 lb

## 2011-07-02 DIAGNOSIS — E739 Lactose intolerance, unspecified: Secondary | ICD-10-CM

## 2011-07-02 DIAGNOSIS — R197 Diarrhea, unspecified: Secondary | ICD-10-CM

## 2011-07-02 NOTE — Progress Notes (Signed)
Subjective:     Patient ID: Rhonda Andrews, female   DOB: 03/01/1941, 70 y.o.   MRN: [id number]  HPI Rhonda Andrews is a 70 yr old female presenting today. She says she was doing great, until 3 weeks ago. She was eating pears and Cool Whip.as a snack. She at a large container of [employer].  Symp[toms lasted for a couple of weeks. . She says she began to have diarrhea.  She has a hx of bacterial  Overgrowth and lactose intolerance. She had a posiitive hydrogen breath test for bacterial overgrowth in February of this year at [facility]. She is symptom free. For the past 4 days she has had one stool day. No diarrhea. Appetite is good. No weight loss. No abdominal pain. No melena or bright red rectal bleeding. Her last colonoscopy was in 2010 by Dr. Karilyn Andrews which revealed left sided diverticulosis, normal terminal ileum, small external hemorrhoids. Review of Systems  See hpi     Current Outpatient Prescriptions  Medication Sig Dispense Refill  . Calcium Carbonate (CALTRATE 600 PO) Take by mouth.        . Cholecalciferol (VITAMIN D-3 PO) Take by mouth.        . Probiotic Product (ALIGN PO) Take by mouth.        . propranolol (INDERAL) 60 MG tablet TAKE (1) TABLET BY MOUTH ONCE DAILY.  30 tablet  6   History reviewed. No pertinent past surgical history. Past Medical History  Diagnosis Date  . Heart rate fast    No Known Allergies Family Status  Relation Status Death Age  . Mother Alive     Non-Hodgkins Lymphoma.  Colon Cancer  . Father Deceased     Teaching laboratory technician   . Sister Deceased     One deceased GSW. One in good health   History   Social History Narrative  . No narrative on file   History   Social History  . Marital Status: Married    Spouse Name: N/A    Number of Children: N/A  . Years of Education: N/A   Occupational History  . Not on file.   Social History Main Topics  . Smoking status: Never Smoker   . Smokeless tobacco: Not on file  . Alcohol Use: Not on file  . Drug Use:  Not on file  . Sexually Active: Not on file   Other Topics Concern  . Not on file   Social History Narrative  . No narrative on file    History   Social History  . Marital Status: Married    Spouse Name: N/A    Number of Children: N/A  . Years of Education: N/A   Occupational History  . Not on file.   Social History Main Topics  . Smoking status: Never Smoker   . Smokeless tobacco: Not on file  . Alcohol Use: No  . Drug Use: No  . Sexually Active: Not on file   Other Topics Concern  . Not on file   Social History Narrative  . No narrative on file    Objective:   Physical Exam Filed Vitals:   07/02/11 1537  BP: 120/68  Pulse: 66  Temp: 97.6 F (36.4 C)  Height: 5\' 4"  (1.626 m)  Weight: 131 lb 6.4 oz (59.603 kg)    Alert and oriented. Skin warm and dry. Oral mucosa is moist. Natural teeth in good condition. Sclera anicteric, conjunctivae is pink. Thyroid not enlarged. No cervical lymphadenopathy. Lungs clear.  Heart regular rate and rhythm.  Abdomen is soft. Bowel sounds are positive. No hepatomegaly. No abdominal masses felt. No tenderness.  No edema to lower extremities. Patient is alert and oriented.     Assessment:    Diarrhea which has now resolved. She has a hx of bacterial overgrowth by hydrogen breath test. She also tells me she is Lactose intolerant. Cool Whip does contain lactose. Suspect her diarrhea was from the [employer]. Her symptoms have now resolved.     Plan:    Try Lactaid Milk. Avoid foods with lactose. Increase fiber in diet.````````````````````````````````````````

## 2011-07-02 NOTE — Patient Instructions (Signed)
Avoid milk products containing lactose.

## 2011-07-06 NOTE — Telephone Encounter (Signed)
Repeat dosing/treatment with rifaximine for SIBO is indicated ( and she did have dramatic improvement with her initial treatment), but she really needs full dosing at 550 mG - this is reason, why she may have had relapse. I strongly suggest she goes and sees Dr. Karilyn Cota. He needs to make future decision and follow her for this problem.Also , her problems now may be of a different nature.  She needs a dedicated gastroenterologist. She did have a positive hydrogen breath test for SIBO and should qualify. Please schedule appointment with Dr. Annell Greening and forward him notes and Hydrogen Breath Test from [facility].

## 2011-08-05 NOTE — Telephone Encounter (Signed)
Patient has been seen in our office by Ms. Setzer

## 2011-08-14 ENCOUNTER — Other Ambulatory Visit (HOSPITAL_COMMUNITY): Payer: Self-pay | Admitting: Family Medicine

## 2011-08-14 DIAGNOSIS — Z1231 Encounter for screening mammogram for malignant neoplasm of breast: Secondary | ICD-10-CM

## 2011-09-05 NOTE — Telephone Encounter (Signed)
Patient was seen in our office by Ms. Dorene Ar NP. In addition to SIBO she also has lactose intolerance. She was advised to call us with her diarrhea relapses and she would be retreated with Rifaximin since she had such a nice response the first time.

## 2011-09-18 ENCOUNTER — Ambulatory Visit (HOSPITAL_COMMUNITY): Payer: Medicare Other

## 2011-10-13 ENCOUNTER — Ambulatory Visit (HOSPITAL_COMMUNITY)
Admission: RE | Admit: 2011-10-13 | Discharge: 2011-10-13 | Disposition: A | Discharge status: Home / Self Care | Payer: Medicare Other | Source: Ambulatory Visit | Attending: Family Medicine | Admitting: Family Medicine

## 2011-10-13 DIAGNOSIS — Z1231 Encounter for screening mammogram for malignant neoplasm of breast: Secondary | ICD-10-CM

## 2012-01-16 ENCOUNTER — Other Ambulatory Visit: Payer: Self-pay | Admitting: Cardiology

## 2012-02-19 ENCOUNTER — Other Ambulatory Visit: Payer: Self-pay | Admitting: Cardiology

## 2012-02-19 MED ORDER — PROPRANOLOL HCL 60 MG PO TABS: 60.0000 mg | ORAL_TABLET | Freq: Every day | ORAL | Status: DC

## 2012-04-27 ENCOUNTER — Ambulatory Visit (INDEPENDENT_AMBULATORY_CARE_PROVIDER_SITE_OTHER): Payer: Medicare Other | Admitting: Internal Medicine

## 2012-04-28 ENCOUNTER — Ambulatory Visit (INDEPENDENT_AMBULATORY_CARE_PROVIDER_SITE_OTHER): Payer: Medicare Other | Admitting: Internal Medicine

## 2012-04-28 ENCOUNTER — Encounter (INDEPENDENT_AMBULATORY_CARE_PROVIDER_SITE_OTHER): Payer: Self-pay | Admitting: Internal Medicine

## 2012-04-28 ENCOUNTER — Telehealth (INDEPENDENT_AMBULATORY_CARE_PROVIDER_SITE_OTHER): Payer: Self-pay | Admitting: *Deleted

## 2012-04-28 VITALS — BP 98/52 | HR 72 | Temp 97.9°F | Ht 64.0 in | Wt 134.1 lb

## 2012-04-28 DIAGNOSIS — R197 Diarrhea, unspecified: Secondary | ICD-10-CM | POA: Insufficient documentation

## 2012-04-28 NOTE — Progress Notes (Addendum)
Subjective:     Patient ID: Rhonda Andrews, female   DOB: 11/04/1940, 71 y.o.   MRN: [id number]  HPI Rhonda Andrews is here for a scheduled visit.  She tells me she had a sinus infection and took an antibiotic. She was treated for a sinus infection six weeks ago.  She tells me ever since then she has had diarrhea.  The antibiotic was she thinks a Zpack. She is having 3 loose stools every other day. No melena or bright red rectal bleeding. She is taking Imodium which helps.  She has a hx of lactose intolerance.   Appetite is good. No weight loss.  Her last colonoscopy was in 2010 by Dr. Karilyn Cota which revealed left sided diverticulosis, normal terminal ileum, small external hemorrhoids.           Review of Systems see hpi Current Outpatient Prescriptions  Medication Sig Dispense Refill  . Calcium Carbonate (CALTRATE 600 PO) Take by mouth.       . Probiotic Product (ALIGN PO) Take by mouth.        . propranolol (INDERAL) 60 MG tablet Take 1 tablet (60 mg total) by mouth daily.  30 tablet  6  . Cholecalciferol (VITAMIN D-3 PO) Take by mouth.         Past Medical History  Diagnosis Date  . Heart rate fast    Past Surgical History  Procedure Date  . Lactose intolerant   . Cholecystectomy 3 yrs ago  . Abdominal hysterectomy     ovaries remained   History   Social History  . Marital Status: Married    Spouse Name: N/A    Number of Children: N/A  . Years of Education: N/A   Occupational History  . Not on file.   Social History Main Topics  . Smoking status: Never Smoker   . Smokeless tobacco: Not on file  . Alcohol Use: No  . Drug Use: No  . Sexually Active: Not on file   Other Topics Concern  . Not on file   Social History Narrative  . No narrative on file   History reviewed. No pertinent family history. No Known Allergies      Objective:   Physical Exam Filed Vitals:   04/28/12 1418  BP: 98/52  Pulse: 72  Temp: 97.9 F (36.6 C)   Alert and oriented. Skin  warm and dry. Oral mucosa is moist.   . Sclera anicteric, conjunctivae is pink. Thyroid not enlarged. No cervical lymphadenopathy. Lungs clear. Heart regular rate and rhythm.  Abdomen is soft. Bowel sounds are positive. No hepatomegaly. No abdominal masses felt. No tenderness.  No edema to lower extremities. .      Assessment:    Diarrhea. Possible IBS.  Possible antibiotic induced. 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    Plan:     Stool WBC, culture, Ova and Para, C-difficile. Imodium BID

## 2012-04-28 NOTE — Telephone Encounter (Signed)
Test per Delrae Rend

## 2012-04-28 NOTE — Patient Instructions (Addendum)
Stools studies. Imodium BId.

## 2012-04-30 LAB — FECAL LACTOFERRIN, QUANT: Lactoferrin: NEGATIVE

## 2012-05-03 LAB — STOOL CULTURE

## 2012-05-06 ENCOUNTER — Telehealth: Payer: Self-pay | Admitting: Physician Assistant

## 2012-05-06 NOTE — Telephone Encounter (Signed)
Patient walk in  -   Just left eye doctor and blood pressure 98/44   Last week at Dr was 98/55

## 2012-05-07 NOTE — Telephone Encounter (Signed)
Went to have eyes checked & Dr. Karilyn Cota & got low readings below.   125/80 - Dr. Neita Carp office this morning  110/80 - at home on her machine  Feeling fine now.  No other complaints.

## 2012-07-05 ENCOUNTER — Encounter (INDEPENDENT_AMBULATORY_CARE_PROVIDER_SITE_OTHER): Payer: Self-pay | Admitting: Internal Medicine

## 2012-07-05 ENCOUNTER — Ambulatory Visit (INDEPENDENT_AMBULATORY_CARE_PROVIDER_SITE_OTHER): Payer: No Typology Code available for payment source | Admitting: Internal Medicine

## 2012-07-05 VITALS — BP 116/70 | HR 62 | Temp 96.9°F | Resp 18 | Ht 64.0 in | Wt 137.6 lb

## 2012-07-05 DIAGNOSIS — R197 Diarrhea, unspecified: Secondary | ICD-10-CM

## 2012-07-05 MED ORDER — METRONIDAZOLE 250 MG PO TABS: 250.0000 mg | ORAL_TABLET | Freq: Three times a day (TID) | ORAL | Status: DC

## 2012-07-05 NOTE — Patient Instructions (Signed)
Keep symptom diary as discussed. Physician will call you with results of small bowel follow-through when completed.

## 2012-07-05 NOTE — Progress Notes (Signed)
Presenting complaint;  Followup for diarrhea.  Subjective:  Patient is 71 year old Caucasian female who presents for followup of chronic diarrhea. She was last seen in August by Ms. Rhonda Ar NP and had stool studies consisting of culture and O&P and these were negative. She states she has had diarrhea for 7 years. She has never been constipated. Lately she has done well. She may have few days when she has normal stools to be followed by diarrhea that she has 3-6 stools and she feels very bloated. When she has diarrhea she has very foul-smelling stools. She has lost a few pounds this year. She had EGD and colonoscopy and September 2010. She had a few hyperplastic gastric polyps. She had left-sided diverticula but no evidence of endoscopic colitis. She has been intolerant of dicyclomine. Patient also complains of postal lightheadedness and is concerned that she may be on too much and dry. Last year she was sent to [facility] by Dr. Andee Andrews and had abnormal breath test suggesting small intestine bacterial overgrowth. She took Xifaxan at a reduced dose for a week or so and felt better. She states nobody has explained to her what this test results means.  Current Medications: Current Outpatient Prescriptions  Medication Sig Dispense Refill  . Calcium Carbonate (CALTRATE 600 PO) Take by mouth. With Vitamin D      . Cholecalciferol (VITAMIN D-3 PO) Take by mouth.        . propranolol (INDERAL) 60 MG tablet Take 1 tablet (60 mg total) by mouth daily.  30 tablet  6     Objective: Blood pressure 116/70, pulse 62, temperature 96.9 F (36.1 C), temperature source Oral, resp. rate 18, height 5\' 4"  (1.626 m), weight 137 lb 9.6 oz (62.415 kg).  patient is alert and in no acute distress. Conjunctiva is pink. Sclera is nonicteric Oropharyngeal mucosa is normal. No neck masses or thyromegaly noted. Cardiac exam with regular rhythm normal S1 and S2. No murmur or gallop noted. Lungs are clear to  auscultation. Abdomen is symmetrical. Bowel sounds are normal. On palpation abdomen is soft and nontender without organomegaly or masses.  No LE edema or clubbing noted.  Labs/studies Results:  From 01/19/2012. Stool culture negative. Stool O&P negative.    Assessment:  Chronic diarrhea. 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 She has never been appropriately treated. Etiology is unclear. There is no history of bowel surgery. She could have small bowel stasis or multiple jejunal or ileal diverticula.   Plan:  Significance of positive hydrogen breath test explained to the patient. She seems to understand this very well. Metronidazole 250 mg by mouth 3 times a day for 2 weeks. Align or quivalent 1 capsule by mouth daily. Small bowel follow-through. Office visit in 4 moths.

## 2012-07-08 ENCOUNTER — Ambulatory Visit (HOSPITAL_COMMUNITY)
Admission: RE | Admit: 2012-07-08 | Discharge: 2012-07-08 | Disposition: A | Discharge status: Home / Self Care | Payer: Medicare Other | Source: Ambulatory Visit | Attending: Internal Medicine | Admitting: Internal Medicine

## 2012-07-08 DIAGNOSIS — R197 Diarrhea, unspecified: Secondary | ICD-10-CM

## 2012-10-06 ENCOUNTER — Other Ambulatory Visit (HOSPITAL_COMMUNITY): Payer: Self-pay | Admitting: Family Medicine

## 2012-10-06 DIAGNOSIS — Z1231 Encounter for screening mammogram for malignant neoplasm of breast: Secondary | ICD-10-CM

## 2012-10-14 ENCOUNTER — Ambulatory Visit (HOSPITAL_COMMUNITY): Payer: No Typology Code available for payment source

## 2012-10-15 HISTORY — PX: OTHER SURGICAL HISTORY: SHX169

## 2012-10-16 ENCOUNTER — Other Ambulatory Visit: Payer: Self-pay

## 2012-10-20 ENCOUNTER — Ambulatory Visit (HOSPITAL_COMMUNITY)
Admission: RE | Admit: 2012-10-20 | Discharge: 2012-10-20 | Disposition: A | Discharge status: Home / Self Care | Payer: Medicare Other | Source: Ambulatory Visit | Attending: Family Medicine | Admitting: Family Medicine

## 2012-10-20 DIAGNOSIS — Z1231 Encounter for screening mammogram for malignant neoplasm of breast: Secondary | ICD-10-CM | POA: Insufficient documentation

## 2012-11-02 ENCOUNTER — Encounter (INDEPENDENT_AMBULATORY_CARE_PROVIDER_SITE_OTHER): Payer: Self-pay | Admitting: Internal Medicine

## 2012-11-02 ENCOUNTER — Ambulatory Visit (INDEPENDENT_AMBULATORY_CARE_PROVIDER_SITE_OTHER): Payer: No Typology Code available for payment source | Admitting: Internal Medicine

## 2012-11-02 VITALS — BP 110/72 | HR 68 | Temp 97.2°F | Resp 18 | Ht 64.0 in | Wt 142.4 lb

## 2012-11-02 DIAGNOSIS — K6389 Other specified diseases of intestine: Secondary | ICD-10-CM

## 2012-11-02 NOTE — Patient Instructions (Addendum)
Notify if symptoms relapse and you have to go back on metronidazole.

## 2012-11-02 NOTE — Progress Notes (Signed)
Presenting complaint;  Diarrhea(small intestinal bacterial overgrowth).  Subjective:  Patient is 72 year old Caucasian female who is here for scheduled visit. She has history of small intestine bacteria overgrowth. She was last seen in November 2013 and given 2 weeks of metronidazole. Since finishing antibiotic therapy she has felt a lot better. In the last 3 months she she had mild diarrhea on 3 different days. She notices much less rumbling and gurgling in her abdomen and able to rest better. She also has eliminated spicy is daily products and cut back on intake of sweets. She has gained 5 pounds since her last visit. She did not experience any side effects with metronidazole. She has one refill left on her prescription.  Current Medications: Current Outpatient Prescriptions  Medication Sig Dispense Refill  . Calcium Carbonate (CALTRATE 600 PO) Take by mouth. With Vitamin D      . Cholecalciferol (VITAMIN D-3 PO) Take by mouth daily.       . propranolol (INDERAL) 60 MG tablet Take 1 tablet (60 mg total) by mouth daily.  30 tablet  6   No current facility-administered medications for this visit.     Objective: Blood pressure 110/72, pulse 68, temperature 97.2 F (36.2 C), temperature source Oral, resp. rate 18, height 5\' 4"  (1.626 m), weight 142 lb 6.4 oz (64.592 kg). Patient is alert and in no acute distress. Conjunctiva is pink. Sclera is nonicteric Erythema/ecchymosis to skin at nasal bridge him recent skin therapy. Oropharyngeal mucosa is normal. No neck masses or thyromegaly noted. Abdomen. Bowel sounds are normal. Abdomen is soft and nontender without organomegaly or masses. No LE edema or clubbing noted.   Assessment:  History of small intestinal bacterial overgrowth. She is doing very well since she was given 2 weeks of metronidazole back in November 2013. If symptoms relapse she can go back on metronidazole and let us know so that we could documented in her  chart.   Plan:  Office visit in one year unless symptoms relapse and unresponsive to antibiotic therapy.

## 2013-03-19 ENCOUNTER — Other Ambulatory Visit: Payer: Self-pay | Admitting: Cardiology

## 2013-04-06 ENCOUNTER — Other Ambulatory Visit: Payer: Self-pay

## 2013-05-14 ENCOUNTER — Telehealth: Payer: Self-pay | Admitting: Adult Health

## 2013-05-14 NOTE — Telephone Encounter (Signed)
Rhonda Andrews is a 72 y/o patient formerly of Dr.DeGent who is to see Dr.Branch on9/26/2014. She is concerned about her medications. She had been on propanolol 60 mg daily and was changed to Metoprolol ER 20 mg daily by her PCP. She is concerned that her HR and BP are rising on this new medication dose. She has been on it since 05/03/2013.   She states her HR is 89 and BP 140/82 today at rest.   I have advised her to go back to propanolol 60 mg as she was taking prior to the medication change as this kept her HR and BP well controlled.   Follow up with Dr. Wyline Mood on scheduled appt.

## 2013-05-26 ENCOUNTER — Ambulatory Visit: Payer: Medicare Other | Admitting: Cardiology

## 2013-05-27 ENCOUNTER — Ambulatory Visit (INDEPENDENT_AMBULATORY_CARE_PROVIDER_SITE_OTHER): Payer: Medicare Other | Admitting: Cardiology

## 2013-05-27 ENCOUNTER — Encounter: Payer: Self-pay | Admitting: Cardiology

## 2013-05-27 VITALS — BP 102/66 | HR 69 | Ht 64.0 in | Wt 140.8 lb

## 2013-05-27 DIAGNOSIS — R002 Palpitations: Secondary | ICD-10-CM

## 2013-05-27 MED ORDER — PROPRANOLOL HCL ER 60 MG PO CP24: 60.0000 mg | ORAL_CAPSULE | Freq: Every day | ORAL | Status: DC

## 2013-05-27 NOTE — Patient Instructions (Addendum)
Your physician recommends that you schedule a follow-up appointment in: 3 months with Dr. Wyline Mood. This appointment will be scheudled today before you leave.  Your physician has recommended you make the following change in your medication:  Stop: Toprol Xl Start: Propranolol LA 60 MG once daily.  Continue all other medications the same.

## 2013-05-27 NOTE — Progress Notes (Signed)
   Clinical Summary Ms. Lipuma is a 72 y.o.female seen in follow up for palpitations  1. Palpitations - previous holter 05/2009 showed no significant arrhythmias.  - managed well w/ propanolol previously - changed to Toprol XL 20mg  daily by PCP, concerned her HR and bp are increasing. Noted some recurrence of her palpitations.  -reports episode few days ago, felt like heart was beating faster. No SOB, no dizziness.       Past Medical History  Diagnosis Date  . Heart rate fast   . Encopresis      No Known Allergies   Current Outpatient Prescriptions  Medication Sig Dispense Refill  . Calcium Carbonate (CALTRATE 600 PO) Take by mouth. With Vitamin D      . Cholecalciferol (VITAMIN D-3 PO) Take by mouth daily.       . propranolol (INDERAL) 60 MG tablet Take 1 tablet (60 mg total) by mouth daily.  30 tablet  6   No current facility-administered medications for this visit.     Past Surgical History  Procedure Laterality Date  . Lactose intolerant    . Cholecystectomy  3 yrs ago  . Abdominal hysterectomy      ovaries remained  . Precancerous  tissue - nose  February 14-2014     No Known Allergies    No family history on file.   Social History Ms. Martinezgarcia reports that she has never smoked. She has never used smokeless tobacco. Ms. Cheadle reports that she does not drink alcohol.   Review of Systems Complete ROS negative other than reported in HPI  Physical Examination p 69 bp 102/66 Wt 140 BMI 24 Gen: resting comfortably, NAD HEENT: no scleral icterus, pupils equal round and reactive, no palptable cervical adenopathy CV: RRR, no m/r/g, no JVD Pulm: CTAB Abd: soft, NT, ND NABS, no hepatosplenomegaly Ext: warm, no edema.  Skin: warm, no rash Neuro: A&Ox3, no focal deficits    Diagnostic Studies 05/27/13 EKG: sinus rhtyhm, normal axis, non-specific ST/T changes    Assessment and Plan  1. Palpitations - reports recurrence after being switched  from propranolol to metoprol. Also notes some occasional low blood pressures while on beta blocker - will change back to propranolol short acting 60mg  bid and follow symptoms   Antoine Poche, M.D., F.A.C.C.

## 2013-06-27 ENCOUNTER — Telehealth (INDEPENDENT_AMBULATORY_CARE_PROVIDER_SITE_OTHER): Payer: Self-pay | Admitting: *Deleted

## 2013-06-27 NOTE — Telephone Encounter (Signed)
Rhonda Andrews is having stomach cramps, gas, and bloating. The only thing different, she ate [employer]. Would like to speak with Tammy if she could please return her call at [phone].

## 2013-06-28 NOTE — Telephone Encounter (Signed)
Patient states that she is feeling a lot better today, she ask since she has the bacterial over growth should she take  Flagyl. She has some on hand. Per Dr.Rehman the patient should take 3 a day for a least 1 week. Patient was advised and I also ask that she call me back with a progress report.

## 2013-07-07 ENCOUNTER — Other Ambulatory Visit: Payer: Self-pay

## 2013-07-27 ENCOUNTER — Emergency Department (HOSPITAL_COMMUNITY)
Admission: EM | Admit: 2013-07-27 | Discharge: 2013-07-27 | Disposition: A | Discharge status: Home / Self Care | Payer: Medicare Other | Attending: Emergency Medicine | Admitting: Emergency Medicine

## 2013-07-27 ENCOUNTER — Encounter (HOSPITAL_COMMUNITY): Payer: Self-pay | Admitting: Emergency Medicine

## 2013-07-27 ENCOUNTER — Encounter (INDEPENDENT_AMBULATORY_CARE_PROVIDER_SITE_OTHER): Payer: Self-pay | Admitting: *Deleted

## 2013-07-27 DIAGNOSIS — Z79899 Other long term (current) drug therapy: Secondary | ICD-10-CM | POA: Insufficient documentation

## 2013-07-27 DIAGNOSIS — Z8659 Personal history of other mental and behavioral disorders: Secondary | ICD-10-CM | POA: Insufficient documentation

## 2013-07-27 DIAGNOSIS — H8110 Benign paroxysmal vertigo, unspecified ear: Secondary | ICD-10-CM | POA: Insufficient documentation

## 2013-07-27 DIAGNOSIS — H5500 Unspecified nystagmus: Secondary | ICD-10-CM | POA: Insufficient documentation

## 2013-07-27 MED ORDER — MECLIZINE HCL 12.5 MG PO TABS: 12.5000 mg | ORAL_TABLET | Freq: Two times a day (BID) | ORAL | Status: DC | PRN

## 2013-07-27 NOTE — ED Provider Notes (Signed)
CSN: [id number]     Arrival date & time 07/27/13  1400 History   First MD Initiated Contact with Patient 07/27/13 1423     Chief Complaint  Patient presents with  . Dizziness   (Consider location/radiation/quality/duration/timing/severity/associated sxs/prior Treatment) HPI Comments: 72 yo female with hx paroxsymal tachycardia and vertigo presents with dizziness with room spinning occurring intermittently over the past 3 weeks, worse today. Reports symptoms worse with head movement and position change and improved with small dose of meclizine. Has had cardiology workup including holter monitor and is currently tweaking her dosing of toprol and inderal to help with her symptoms. Denies chest pain, dyspnea, syncope, gait disturbance, exertional fatigue, N/V, fever, chills and recent illness.   The history is provided by the patient.    Past Medical History  Diagnosis Date  . Heart rate fast   . 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 Lactose intolerant    . Cholecystectomy  3 yrs ago  . Abdominal hysterectomy      ovaries remained  . Precancerous  tissue - nose  February 14-2014   History reviewed. No pertinent family history. History  Substance Use Topics  . Smoking status: Never Smoker   . Smokeless tobacco: Never Used  . Alcohol Use: No   OB History   Grav Para Term Preterm Abortions TAB SAB Ect Mult Living                 Review of Systems  Constitutional: Negative for fever.  HENT: Negative for rhinorrhea and sore throat.   Eyes: Negative for visual disturbance.  Respiratory: Negative for cough.   Cardiovascular: Negative for chest pain and leg swelling.  Gastrointestinal: Negative for abdominal pain.  Genitourinary: Negative for genital sores.  Musculoskeletal: Negative for back pain and gait problem.  Skin: Negative for rash.  Neurological: Positive for dizziness and light-headedness. Negative for seizures, syncope, facial  asymmetry, speech difficulty, weakness, numbness and headaches.  Hematological: Negative for adenopathy.  Psychiatric/Behavioral: Negative for agitation.    Allergies  Review of patient's allergies indicates no known allergies.  Home Medications   Current Outpatient Rx  Name  Route  Sig  Dispense  Refill  . Calcium Carbonate (CALTRATE 600 PO)   Oral   Take by mouth. With Vitamin D         . Cholecalciferol (VITAMIN D-3 PO)   Oral   Take by mouth daily.          . propranolol ER (INDERAL LA) 60 MG 24 hr capsule   Oral   Take 1 capsule (60 mg total) by mouth daily.   30 capsule   6    There were no vitals taken for this visit. Physical Exam  Nursing note and vitals reviewed. Constitutional: She is oriented to person, place, and time. She appears well-developed and well-nourished.  HENT:  Head: Normocephalic and atraumatic.  Right Ear: External ear normal.  Left Ear: External ear normal.  Nose: Nose normal.  Mouth/Throat: Oropharynx is clear and moist.  Eyes: Conjunctivae are normal. Pupils are equal, round, and reactive to light. Right eye exhibits nystagmus. Left eye exhibits nystagmus.  Neck: Normal range of motion. Neck supple.  Cardiovascular: Normal rate, regular rhythm, normal heart sounds and intact distal pulses.   Pulmonary/Chest: Effort normal and breath sounds normal.  Abdominal: Soft. Bowel sounds are normal. There is no tenderness.  Musculoskeletal: Normal range of motion.  Lymphadenopathy:    She has no  cervical adenopathy.  Neurological: She is alert and oriented to person, place, and time. She has normal strength. No cranial nerve deficit or sensory deficit. She displays a negative Romberg sign. Coordination and gait normal. GCS eye subscore is 4. GCS verbal subscore is 5. GCS motor subscore is 6.  Skin: Skin is warm and dry.  Psychiatric: She has a normal mood and affect.    ED Course  Procedures (including critical care time) Labs Review Labs  Reviewed - No data to display Imaging Review No results found.  EKG Interpretation    Date/Time:  Wednesday July 27 2013 14:05:37 EST Ventricular Rate:  76 PR Interval:  154 QRS Duration: 96 QT Interval:  408 QTC Calculation: 459 R Axis:   -35 Text Interpretation:  Normal sinus rhythm with sinus arrhythmia Left axis deviation Septal infarct , age undetermined Abnormal ECG not Confirmed by JACUBOWITZ  MD, SAM ([id number]) on 07/27/2013 3:15:45 PM            MDM   1. Benign positional vertigo      72 yo female presents dizziness with room spinning. Neuro exam reveals no deficits. No ataxia during ambulation. Mild nystagmus per EOM testing. Orthostatics neg. EKG reveals NSR with no acute ST or Twave changes, no new arrhythmia. No evidence to suggest posterior circulation pathology, cardiac arrhythmia, orthostatic hypotension. Clinical picture supportive of benign peripheral vertigo. Recommend continuing her inderal/toprol per cardiology and meclizine PRN and f/u with ENT for further evaluation of her vertigo. Strict return instructions discussed and provided to the patient in writing at time of d/c.   Simmie Davies, NP 07/28/13 1157

## 2013-07-27 NOTE — ED Provider Notes (Signed)
Patient with dizziness i.e. sensation of bruising onset 3 weeks ago last for a few seconds at a time when she changes position or turns her head. She feels improved since treated with meclizine earlier today. Symptoms similar to vertigo she's had in the past. No visual changes no difficulty speaking no focal numbness or weakness. Exam alert Glasgow Coma Score 15 gait is normal cranial nerves II through XII grossly intact with 5 over 5 overall. Bubbly the patient suffering from benign positional vertigo. She requests local ENT referral. She's been to her primary care physician in for same complaint [facility] who suggests ENT fall to the patient  Doug Sou, MD 07/27/13 1517

## 2013-07-27 NOTE — ED Notes (Signed)
Pt c/o dizziness intermittently x 3 weeks that feels like room is spinning; pt sts improvement with meclizine and worse with position change

## 2013-07-29 NOTE — ED Provider Notes (Signed)
Medical screening examination/treatment/procedure(s) were conducted as a shared visit with non-physician practitioner(s) and myself.  I personally evaluated the patient during the encounter.  EKG Interpretation    Date/Time:  Wednesday July 27 2013 14:05:37 EST Ventricular Rate:  76 PR Interval:  154 QRS Duration: 96 QT Interval:  408 QTC Calculation: 459 R Axis:   -35 Text Interpretation:  Normal sinus rhythm with sinus arrhythmia Left axis deviation Septal infarct , age undetermined Abnormal ECG not Confirmed by Ethelda Chick  MD, Saintclair Schroader ([id number]) on 07/27/2013 3:15:45 PM             Doug Sou, MD 07/29/13 1147

## 2013-08-18 ENCOUNTER — Ambulatory Visit (INDEPENDENT_AMBULATORY_CARE_PROVIDER_SITE_OTHER): Payer: Medicare Other | Admitting: Cardiology

## 2013-08-18 VITALS — BP 117/78 | HR 67

## 2013-08-18 DIAGNOSIS — R002 Palpitations: Secondary | ICD-10-CM

## 2013-08-18 NOTE — Patient Instructions (Signed)
Your physician recommends that you schedule a follow-up appointment in: 1 year with Dr. Branch. You should receive a letter in the mail in 10 months. If you do not receive this letter by October 2015 call our office to schedule this appointment.   Your physician recommends that you continue on your current medications as directed. Please refer to the Current Medication list given to you today.  

## 2013-08-18 NOTE — Progress Notes (Signed)
Clinical Summary Rhonda Andrews is a 72 y.o.female seen today in follow up for the following medical problems.   1. Palpitations  - previous holter 05/2009 showed no significant arrhythmias.  - managed well w/ propanolol previously  - changed to Toprol XL 200 mg daily by PCP with return of symptoms. Last visit we changed her back to propanolol - denies any significant symtpms since changing back to propanlol.   2. Dizziness - recent diagnosis of vertigo, started on antivert prn - she does describe some mild dizziness with standing, admits to not drinking much fluids daily  Past Medical History  Diagnosis Date  . Heart rate fast   . 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 Calcium Carbonate (CALTRATE 600 PO) Take by mouth. With Vitamin D      . Cholecalciferol (VITAMIN D-3 PO) Take by mouth daily.       . meclizine (ANTIVERT) 12.5 MG tablet Take 1 tablet (12.5 mg total) by mouth 2 (two) times daily as needed for dizziness.  30 tablet  0  . metoprolol succinate (TOPROL-XL) 25 MG 24 hr tablet Take 25 mg by mouth daily.      . propranolol ER (INDERAL LA) 60 MG 24 hr capsule Take 1 capsule (60 mg total) by mouth daily.  30 capsule  6   No current facility-administered medications for this visit.     Past Surgical History  Procedure Laterality Date  . Lactose intolerant    . Cholecystectomy  3 yrs ago  . Abdominal hysterectomy      ovaries remained  . Precancerous  tissue - nose  February 14-2014     No Known Allergies    No family history on file.   Social History Rhonda Andrews reports that she has never smoked. She has never used smokeless tobacco. Rhonda Andrews reports that she does not drink alcohol.   Review of Systems CONSTITUTIONAL: No weight loss, fever, chills, weakness or fatigue.  HEENT: Eyes: No visual loss, blurred vision, double vision or yellow sclerae.No hearing loss,  sneezing, congestion, runny nose or sore throat.  SKIN: No rash or itching.  CARDIOVASCULAR: per HPI RESPIRATORY: No shortness of breath, cough or sputum.  GASTROINTESTINAL: No anorexia, nausea, vomiting or diarrhea. No abdominal pain or blood.  GENITOURINARY: No burning on urination, no polyuria NEUROLOGICAL: vertigo.  MUSCULOSKELETAL: No muscle, back pain, joint pain or stiffness.  LYMPHATICS: No enlarged nodes. No history of splenectomy.  PSYCHIATRIC: No history of depression or anxiety.  ENDOCRINOLOGIC: No reports of sweating, cold or heat intolerance. No polyuria or polydipsia.  Marland Kitchen   Physical Examination p 67 bp 117/78  Gen: resting comfortably, no acute distress HEENT: no scleral icterus, pupils equal round and reactive, no palptable cervical adenopathy,  CV: RRR, no m/r/g, no JVD, no carotid bruits Resp: Clear to auscultation bilaterally GI: abdomen is soft, non-tender, non-distended, normal bowel sounds, no hepatosplenomegaly MSK: extremities are warm, no edema.  Skin: warm, no rash Neuro:  no focal deficits Psych: appropriate affect   Diagnostic Studies 05/27/13 EKG: sinus rhtyhm, normal axis, non-specific ST/T changes     Assessment and Plan   1. Palpitations  - symptoms well controlled on propanolol, will continnue  2. Vertigo - she has an appointment with ENT coming up for further evaluation - she describes some occasional dizziness with standing, encouraged increased fluid and electrolyte intake. Continue current propanolol dose for now.  F/u 1 year   Antoine Poche, M.D., F.A.C.C.

## 2013-11-03 ENCOUNTER — Encounter (INDEPENDENT_AMBULATORY_CARE_PROVIDER_SITE_OTHER): Payer: Self-pay | Admitting: Internal Medicine

## 2013-11-03 ENCOUNTER — Ambulatory Visit (INDEPENDENT_AMBULATORY_CARE_PROVIDER_SITE_OTHER): Payer: Medicare Other | Admitting: Internal Medicine

## 2013-11-03 VITALS — BP 110/74 | HR 72 | Temp 97.5°F | Ht 64.0 in | Wt 142.8 lb

## 2013-11-03 DIAGNOSIS — K6389 Other specified diseases of intestine: Secondary | ICD-10-CM | POA: Insufficient documentation

## 2013-11-03 DIAGNOSIS — K219 Gastro-esophageal reflux disease without esophagitis: Secondary | ICD-10-CM | POA: Insufficient documentation

## 2013-11-03 NOTE — Progress Notes (Signed)
Subjective:     Patient ID: Rhonda Andrews, female   DOB: 01/21/1941, 73 y.o.   MRN: [id number]  HPI Here today for f/u. She was last seen in our office in March of 2014. She has a hx of small intestinal bacterial overgrowth. In November 2013 she was given 2 weeks of Flagyl. After taking the medication she felt better. Her diarrhea and bloating resolved.  She denies any bloating. Her appetite is good. No weight loss.  She usually has a BM twice a day. No loose stools. For the most she feels good. She says she is avoiding spicy foods.  07/2012 Small bowel Follow thru for diarrhea: normal exam.  Colonoscopy in 2010 Left sided diverticulosis. Normal terminal ileum. Small external hemorrhoids.  10/23/2010 Bacterial Overgrowth HBT: Positive hydrogen breath test for bacterial overgrowth.     Mother alive with hx of colon cancer at age 19. Review of Systems Past Medical History  Diagnosis Date  . Heart rate fast   . 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 Lactose intolerant    . Cholecystectomy  3 yrs ago  . Abdominal hysterectomy      ovaries remained  . Precancerous  tissue - nose  February 14-2014    No Known Allergies  Current Outpatient Prescriptions on File Prior to Visit  Medication Sig Dispense Refill  . Calcium Carbonate (CALTRATE 600 PO) Take by mouth. With Vitamin D      . Cholecalciferol (VITAMIN D-3 PO) Take by mouth daily.       . meclizine (ANTIVERT) 12.5 MG tablet Take 1 tablet (12.5 mg total) by mouth 2 (two) times daily as needed for dizziness.  30 tablet  0  . propranolol ER (INDERAL LA) 60 MG 24 hr capsule Take 1 capsule (60 mg total) by mouth daily.  30 capsule  6   No current facility-administered medications on file prior to visit.        Objective:   Physical Exam  Filed Vitals:   11/03/13 1439  BP: 110/74  Pulse: 72  Temp: 97.5 F (36.4 C)  Height: 5\' 4"  (1.626 m)  Weight: 142 lb 12.8 oz (64.774 kg)   Alert  and oriented. Skin warm and dry. Oral mucosa is moist.   . Sclera anicteric, conjunctivae is pink. Thyroid not enlarged. No cervical lymphadenopathy. Lungs clear. Heart regular rate and rhythm.  Abdomen is soft. Bowel sounds are positive. No hepatomegaly. No abdominal masses felt. No tenderness.  No edema to lower extremities.       Assessment:    Diarrhea/Small bowel bacterial overgrowth. No symptoms at this time. She feels good. No GI symptoms.     Plan:    OV in 1 yr.

## 2013-11-03 NOTE — Patient Instructions (Signed)
OV in 1 yr. 

## 2013-11-24 ENCOUNTER — Ambulatory Visit (INDEPENDENT_AMBULATORY_CARE_PROVIDER_SITE_OTHER): Payer: Medicare Other | Admitting: Internal Medicine

## 2013-11-24 ENCOUNTER — Encounter (INDEPENDENT_AMBULATORY_CARE_PROVIDER_SITE_OTHER): Payer: Self-pay | Admitting: Internal Medicine

## 2013-11-24 VITALS — BP 112/60 | HR 68 | Temp 97.0°F | Ht 64.0 in | Wt 141.0 lb

## 2013-11-24 DIAGNOSIS — K219 Gastro-esophageal reflux disease without esophagitis: Secondary | ICD-10-CM

## 2013-11-24 DIAGNOSIS — R143 Flatulence: Secondary | ICD-10-CM

## 2013-11-24 DIAGNOSIS — R141 Gas pain: Secondary | ICD-10-CM

## 2013-11-24 DIAGNOSIS — R140 Abdominal distension (gaseous): Secondary | ICD-10-CM | POA: Insufficient documentation

## 2013-11-24 DIAGNOSIS — R142 Eructation: Secondary | ICD-10-CM

## 2013-11-24 MED ORDER — OMEPRAZOLE 40 MG PO CPDR: 40.0000 mg | DELAYED_RELEASE_CAPSULE | Freq: Every day | ORAL | Status: DC

## 2013-11-24 NOTE — Patient Instructions (Signed)
--  Protonix 40 mg p.o. daily ?

## 2013-11-24 NOTE — Progress Notes (Signed)
Subjective:     Patient ID: Rhonda Andrews, female   DOB: 10-31-1940, 73 y.o.   MRN: [id number]  HPI Presents today with c/o  She went out of town for a funeral this past Friday. She ate BBQ Friday. Since then She says she has felt  Bloated and crampy.   Appetite is good. She is afraid to eat because of the bloating. No weight loss. Last seen the first of March for diarrhea and bloating. Which had resolved at the office visit. She is having one stool a day. No melena or bright red rectal bleeding.      07/2012 Small bowel Follow thru for diarrhea: normal exam.  Colonoscopy in 2010  Left sided diverticulosis. Normal terminal ileum. Small external hemorrhoids.  10/23/2010 Bacterial Overgrowth HBT:  Positive hydrogen breath test for bacterial overgrowth.     Review of Systems     Past Medical History  Diagnosis Date  . Heart rate fast   . 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 Lactose intolerant    . Cholecystectomy  3 yrs ago  . Abdominal hysterectomy      ovaries remained  . Precancerous  tissue - nose  February 14-2014    No Known Allergies  Current Outpatient Prescriptions on File Prior to Visit  Medication Sig Dispense Refill  . Calcium Carbonate (CALTRATE 600 PO) Take by mouth. With Vitamin D      . Cholecalciferol (VITAMIN D-3 PO) Take by mouth daily.       . meclizine (ANTIVERT) 12.5 MG tablet Take 1 tablet (12.5 mg total) by mouth 2 (two) times daily as needed for dizziness.  30 tablet  0  . propranolol ER (INDERAL LA) 60 MG 24 hr capsule Take 1 capsule (60 mg total) by mouth daily.  30 capsule  6   No current facility-administered medications on file prior to visit.     Objective:   Physical Exam  Filed Vitals:   11/24/13 0926  BP: 112/60  Pulse: 68  Temp: 97 F (36.1 C)  Height: 5\' 4"  (1.626 m)  Weight: 141 lb (63.957 kg)   Alert and oriented. Skin warm and dry. Oral mucosa is moist.   . Sclera anicteric,  conjunctivae is pink. Thyroid not enlarged. No cervical lymphadenopathy. Lungs clear. Heart regular rate and rhythm.  Abdomen is soft. Bowel sounds are positive. No hepatomegaly. No abdominal masses felt. No tenderness.  No edema to lower extremities.       Assessment:     GERD, Bloating. Symptoms started after eating spicy BBQ   Plan:     Rx for Omperazole 40mg  daily 30 minutes before breakfast. PR in a couple of weeks.

## 2013-12-05 ENCOUNTER — Other Ambulatory Visit (HOSPITAL_COMMUNITY): Payer: Self-pay | Admitting: Family Medicine

## 2013-12-05 DIAGNOSIS — Z1231 Encounter for screening mammogram for malignant neoplasm of breast: Secondary | ICD-10-CM

## 2013-12-20 ENCOUNTER — Ambulatory Visit (HOSPITAL_COMMUNITY)
Admission: RE | Admit: 2013-12-20 | Discharge: 2013-12-20 | Disposition: A | Discharge status: Home / Self Care | Payer: Medicare Other | Source: Ambulatory Visit | Attending: Family Medicine | Admitting: Family Medicine

## 2013-12-20 DIAGNOSIS — Z1231 Encounter for screening mammogram for malignant neoplasm of breast: Secondary | ICD-10-CM | POA: Insufficient documentation

## 2013-12-22 ENCOUNTER — Ambulatory Visit (HOSPITAL_COMMUNITY): Payer: Medicare Other

## 2014-01-31 ENCOUNTER — Encounter (INDEPENDENT_AMBULATORY_CARE_PROVIDER_SITE_OTHER): Payer: Self-pay | Admitting: Internal Medicine

## 2014-01-31 DIAGNOSIS — K6389 Other specified diseases of intestine: Secondary | ICD-10-CM

## 2014-01-31 MED ORDER — METRONIDAZOLE 500 MG PO TABS: 500.0000 mg | ORAL_TABLET | Freq: Two times a day (BID) | ORAL | Status: DC

## 2014-01-31 NOTE — Telephone Encounter (Signed)
Thinks she having another episode of "bacterial overgrowth". She has taken 3 days of Flagyl and feels better. Would like to be covered for 4 more days. I have sent an Rx in for her. She will call with a PR report.

## 2014-08-09 ENCOUNTER — Encounter (INDEPENDENT_AMBULATORY_CARE_PROVIDER_SITE_OTHER): Payer: Self-pay | Admitting: *Deleted

## 2014-08-16 ENCOUNTER — Encounter: Payer: Self-pay | Admitting: Cardiology

## 2014-08-16 ENCOUNTER — Ambulatory Visit (INDEPENDENT_AMBULATORY_CARE_PROVIDER_SITE_OTHER): Payer: Medicare Other | Admitting: Cardiology

## 2014-08-16 VITALS — BP 115/71 | HR 71 | Ht 64.0 in | Wt 141.0 lb

## 2014-08-16 DIAGNOSIS — Z136 Encounter for screening for cardiovascular disorders: Secondary | ICD-10-CM

## 2014-08-16 DIAGNOSIS — R002 Palpitations: Secondary | ICD-10-CM

## 2014-08-16 DIAGNOSIS — R0789 Other chest pain: Secondary | ICD-10-CM

## 2014-08-16 MED ORDER — FLUTICASONE PROPIONATE 50 MCG/ACT NA SUSP: 1.0000 | Freq: Every day | NASAL | Status: DC | PRN

## 2014-08-16 NOTE — Progress Notes (Signed)
     Clinical Summary Ms. Joshi is a 73 y.o.female seen today for follow up of the following medical problems.   1. Palpitations  - previous holter 05/2009 showed no significant arrhythmias.  - her symptoms have been well controlled on propanolol. In the past she was switched to Toprol with recurrence of symptoms, changed back to propanolol w/ resolution of symptoms       Past Medical History  Diagnosis Date  . Heart rate fast   . 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 Calcium Carbonate (CALTRATE 600 PO) Take by mouth. With Vitamin D    . Cholecalciferol (VITAMIN D-3 PO) Take by mouth daily.     . meclizine (ANTIVERT) 12.5 MG tablet Take 1 tablet (12.5 mg total) by mouth 2 (two) times daily as needed for dizziness. 30 tablet 0  . metroNIDAZOLE (FLAGYL) 500 MG tablet Take 1 tablet (500 mg total) by mouth 2 (two) times daily. 20 tablet 0  . omeprazole (PRILOSEC) 40 MG capsule Take 1 capsule (40 mg total) by mouth daily. 90 capsule 3  . propranolol ER (INDERAL LA) 60 MG 24 hr capsule Take 1 capsule (60 mg total) by mouth daily. 30 capsule 6   No current facility-administered medications for this visit.     Past Surgical History  Procedure Laterality Date  . Lactose intolerant    . Cholecystectomy  3 yrs ago  . Abdominal hysterectomy      ovaries remained  . Precancerous  tissue - nose  February 14-2014     No Known Allergies    No family history on file.   Social History Ms. Rumsey reports that she has never smoked. She has never used smokeless tobacco. Ms. Ohagan reports that she does not drink alcohol.   Review of Systems CONSTITUTIONAL: No weight loss, fever, chills, weakness or fatigue.  HEENT: Eyes: No visual loss, blurred vision, double vision or yellow sclerae.No hearing loss, sneezing, congestion, runny nose or sore throat.  SKIN: No rash or itching.    CARDIOVASCULAR: per HPI RESPIRATORY: No shortness of breath, cough or sputum.  GASTROINTESTINAL: No anorexia, nausea, vomiting or diarrhea. No abdominal pain or blood.  GENITOURINARY: No burning on urination, no polyuria NEUROLOGICAL: No headache, dizziness, syncope, paralysis, ataxia, numbness or tingling in the extremities. No change in bowel or bladder control.  MUSCULOSKELETAL: No muscle, back pain, joint pain or stiffness.  LYMPHATICS: No enlarged nodes. No history of splenectomy.  PSYCHIATRIC: No history of depression or anxiety.  ENDOCRINOLOGIC: No reports of sweating, cold or heat intolerance. No polyuria or polydipsia.  Marland Kitchen   Physical Examination p 71 bp 115/71 Wt 141 lbs BMI 24 Gen: resting comfortably, no acute distress HEENT: no scleral icterus, pupils equal round and reactive, no palptable cervical adenopathy,  CV: RRR, no m/r/g, no JVD, no carotid bruits Resp: Clear to auscultation bilaterally GI: abdomen is soft, non-tender, non-distended, normal bowel sounds, no hepatosplenomegaly MSK: extremities are warm, no edema.  Skin: warm, no rash Neuro:  no focal deficits Psych: appropriate affect     Assessment and Plan  1. Palpitations  - symptoms well controlled on propanolol, continue current therapy   F/u 1 year       Arnoldo Lenis, M.D.

## 2014-08-16 NOTE — Patient Instructions (Signed)
   Flonase nasal spray daily as needed - new sent to pharm Continue all other medications.   Your physician wants you to follow up in:  1 year.  You will receive a reminder letter in the mail one-two months in advance.  If you don't receive a letter, please call our office to schedule the follow up appointment

## 2014-09-27 DIAGNOSIS — J343 Hypertrophy of nasal turbinates: Secondary | ICD-10-CM | POA: Diagnosis not present

## 2014-09-27 DIAGNOSIS — J342 Deviated nasal septum: Secondary | ICD-10-CM | POA: Diagnosis not present

## 2014-09-27 DIAGNOSIS — J310 Chronic rhinitis: Secondary | ICD-10-CM | POA: Diagnosis not present

## 2014-10-04 DIAGNOSIS — L820 Inflamed seborrheic keratosis: Secondary | ICD-10-CM | POA: Diagnosis not present

## 2014-10-04 DIAGNOSIS — D485 Neoplasm of uncertain behavior of skin: Secondary | ICD-10-CM | POA: Diagnosis not present

## 2014-10-04 DIAGNOSIS — Z85828 Personal history of other malignant neoplasm of skin: Secondary | ICD-10-CM | POA: Diagnosis not present

## 2014-10-04 DIAGNOSIS — C4441 Basal cell carcinoma of skin of scalp and neck: Secondary | ICD-10-CM | POA: Diagnosis not present

## 2014-10-04 DIAGNOSIS — L570 Actinic keratosis: Secondary | ICD-10-CM | POA: Diagnosis not present

## 2014-10-09 DIAGNOSIS — H2513 Age-related nuclear cataract, bilateral: Secondary | ICD-10-CM | POA: Diagnosis not present

## 2014-10-27 DIAGNOSIS — H832X9 Labyrinthine dysfunction, unspecified ear: Secondary | ICD-10-CM | POA: Diagnosis not present

## 2014-10-28 ENCOUNTER — Emergency Department (HOSPITAL_COMMUNITY): Payer: Medicare Other

## 2014-10-28 ENCOUNTER — Encounter (HOSPITAL_COMMUNITY): Payer: Self-pay | Admitting: Emergency Medicine

## 2014-10-28 ENCOUNTER — Emergency Department (HOSPITAL_COMMUNITY)
Admission: EM | Admit: 2014-10-28 | Discharge: 2014-10-28 | Disposition: A | Discharge status: Home / Self Care | Payer: Medicare Other | Attending: Emergency Medicine | Admitting: Emergency Medicine

## 2014-10-28 DIAGNOSIS — E860 Dehydration: Secondary | ICD-10-CM | POA: Diagnosis not present

## 2014-10-28 DIAGNOSIS — Z79899 Other long term (current) drug therapy: Secondary | ICD-10-CM | POA: Insufficient documentation

## 2014-10-28 DIAGNOSIS — R9431 Abnormal electrocardiogram [ECG] [EKG]: Secondary | ICD-10-CM | POA: Diagnosis not present

## 2014-10-28 DIAGNOSIS — R059 Cough, unspecified: Secondary | ICD-10-CM

## 2014-10-28 DIAGNOSIS — R05 Cough: Secondary | ICD-10-CM | POA: Diagnosis not present

## 2014-10-28 LAB — COMPREHENSIVE METABOLIC PANEL
ALBUMIN: 4 g/dL (ref 3.5–5.2)
ALT: 18 U/L (ref 0–35)
AST: 21 U/L (ref 0–37)
Alkaline Phosphatase: 76 U/L (ref 39–117)
Anion gap: 5 (ref 5–15)
BUN: 20 mg/dL (ref 6–23)
CALCIUM: 9.9 mg/dL (ref 8.4–10.5)
CHLORIDE: 104 mmol/L (ref 96–112)
CO2: 31 mmol/L (ref 19–32)
Creatinine, Ser: 1.09 mg/dL (ref 0.50–1.10)
GFR calc non Af Amer: 50 mL/min — ABNORMAL LOW (ref >90)
GFR, EST AFRICAN AMERICAN: 58 mL/min — AB (ref >90)
GLUCOSE: 97 mg/dL (ref 70–99)
Potassium: 4.7 mmol/L (ref 3.5–5.1)
Sodium: 140 mmol/L (ref 135–145)
Total Bilirubin: 0.3 mg/dL (ref 0.3–1.2)
Total Protein: 6.7 g/dL (ref 6.0–8.3)

## 2014-10-28 LAB — CBC WITH DIFFERENTIAL/PLATELET
BASOS PCT: 0 % (ref 0–1)
Basophils Absolute: 0 10*3/uL (ref 0.0–0.1)
EOS PCT: 1 % (ref 0–5)
Eosinophils Absolute: 0.1 10*3/uL (ref 0.0–0.7)
HCT: 44 % (ref 36.0–46.0)
Hemoglobin: 14.9 g/dL (ref 12.0–15.0)
LYMPHS ABS: 2.7 10*3/uL (ref 0.7–4.0)
Lymphocytes Relative: 38 % (ref 12–46)
MCH: 30.5 pg (ref 26.0–34.0)
MCHC: 33.9 g/dL (ref 30.0–36.0)
MCV: 90.2 fL (ref 78.0–100.0)
MONOS PCT: 9 % (ref 3–12)
Monocytes Absolute: 0.6 10*3/uL (ref 0.1–1.0)
NEUTROS PCT: 52 % (ref 43–77)
Neutro Abs: 3.7 10*3/uL (ref 1.7–7.7)
Platelets: 270 10*3/uL (ref 150–400)
RBC: 4.88 MIL/uL (ref 3.87–5.11)
RDW: 12.9 % (ref 11.5–15.5)
WBC: 7.1 10*3/uL (ref 4.0–10.5)

## 2014-10-28 LAB — URINE MICROSCOPIC-ADD ON

## 2014-10-28 LAB — URINALYSIS, ROUTINE W REFLEX MICROSCOPIC
Bilirubin Urine: NEGATIVE
Glucose, UA: NEGATIVE mg/dL
Ketones, ur: 15 mg/dL — AB
Nitrite: NEGATIVE
Protein, ur: NEGATIVE mg/dL
Specific Gravity, Urine: 1.019 (ref 1.005–1.030)
Urobilinogen, UA: 0.2 mg/dL (ref 0.0–1.0)
pH: 5 (ref 5.0–8.0)

## 2014-10-28 MED ORDER — SODIUM CHLORIDE 0.9 % IV BOLUS (SEPSIS)
1000.0000 mL | Freq: Once | INTRAVENOUS | Status: AC
  Administered 2014-10-28: 1000 mL via INTRAVENOUS

## 2014-10-28 NOTE — ED Notes (Signed)
Pt having multiple complaints. Pt sts that she is being tx for a "bad L sinus" and is using nasal spray. Also reports worsening vertigo recently. Pt sts she has frequent diarrhea d/t bacterial overgrowth and is feeling like she is dehydrated.

## 2014-10-28 NOTE — ED Provider Notes (Signed)
CSN: [id number]     Arrival date & time 10/28/14  1541 History   First MD Initiated Contact with Patient 10/28/14 1949     Chief Complaint  Patient presents with  . Dehydration     (Consider location/radiation/quality/duration/timing/severity/associated sxs/prior Treatment) HPI   74 year old female with history of heart palpitation who presents with multiple complaints. Patient states for the past year she has not been feeling well. She reported having nasal congestion, and inner ear problem for the same duration. Report having bouts of dizziness especially with riding the elevator. Report having occasional nonproductive cough. She has been seen and evaluated by ENT Dr. Festus Holts 3 weeks ago and was told that she has "bad sinus" she was prescribed nasal spray, as well as salt and baking soda flushes for her nose which she states helps her for about a week but her symptoms returned. She reports feeling dehydrated although she has normal appetite. She reports having intermittent bouts of diarrhea which has been ongoing throughout her life. States she has 3-4 bouts of loose stools a day but not every day. She denies having any fever, severe headache, vision changes, neck pain, runny nose, sneezing, pleuritic chest pain, short of breath, abdominal pain, back pain, dysuria, hematuria, hematochezia or melena. Denies any focal weakness or numbness.  Past Medical History  Diagnosis Date  . Heart rate fast   . 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 Lactose intolerant    . Cholecystectomy  3 yrs ago  . Abdominal hysterectomy      ovaries remained  . Precancerous  tissue - nose  February 14-2014   No family history on file. History  Substance Use Topics  . Smoking status: Never Smoker   . Smokeless tobacco: Never Used  . Alcohol Use: No   OB History    No data available     Review of Systems  All other systems reviewed and are negative.     Allergies   Prednisone  Home Medications   Prior to Admission medications   Medication Sig Start Date End Date Taking? Authorizing Provider  Calcium Carbonate-Vitamin D (CALCIUM + D PO) Take 1 tablet by mouth daily with lunch. Calcium 1200 mg, vitamin D3 1000 units   Yes Historical Provider, MD  loperamide (IMODIUM A-D) 2 MG tablet Take 2 mg by mouth daily as needed for diarrhea or loose stools.   Yes Historical Provider, MD  meclizine (ANTIVERT) 12.5 MG tablet Take 1 tablet (12.5 mg total) by mouth 2 (two) times daily as needed for dizziness. Patient taking differently: Take 3.125 mg by mouth 2 (two) times daily as needed for dizziness. 1/4 tablet 07/27/13  Yes Liliane Bade, NP  metroNIDAZOLE (FLAGYL) 500 MG tablet Take 1 tablet (500 mg total) by mouth 2 (two) times daily. Patient taking differently: Take 500 mg by mouth See admin instructions. Take for 2 times daily for 7 days as needed for chronic diarrhea 01/31/14  Yes Butch Penny, NP  propranolol ER (INDERAL LA) 60 MG 24 hr capsule Take 1 capsule (60 mg total) by mouth daily. Patient taking differently: Take 60 mg by mouth daily with supper.  05/27/13  Yes Arnoldo Lenis, MD  Triamcinolone Acetonide (NASACORT AQ NA) Place 2 sprays into both nostrils daily as needed (dizziness from sinus).   Yes Historical Provider, MD  fluticasone (FLONASE) 50 MCG/ACT nasal spray Place 1 spray into both nostrils daily as needed for allergies or rhinitis. Patient not  taking: Reported on 10/28/2014 08/16/14   Arnoldo Lenis, MD   BP 136/83 mmHg  Pulse 86  Temp(Src) 97.7 F (36.5 C)  Resp 16  Ht 5\' 4"  (1.626 m)  Wt 135 lb (61.236 kg)  BMI 23.16 kg/m2  SpO2 99% Physical Exam  Constitutional: She is oriented to person, place, and time. She appears well-developed and well-nourished. No distress.  HENT:  Head: Atraumatic.  Mouth/Throat: Oropharynx is clear and moist.  Eyes: Conjunctivae and EOM are normal. Pupils are equal, round, and reactive to light.   Neck: Normal range of motion. Neck supple.  No nuchal rigidity  Cardiovascular: Normal rate, regular rhythm and intact distal pulses.   Pulmonary/Chest: Effort normal and breath sounds normal. No respiratory distress. She has no wheezes. She exhibits no tenderness.  Abdominal: Soft. There is no tenderness.  Musculoskeletal: She exhibits no edema or tenderness.  Neurological: She is alert and oriented to person, place, and time. She has normal strength and normal reflexes. No cranial nerve deficit or sensory deficit. She displays a negative Romberg sign. Coordination and gait normal. GCS eye subscore is 4. GCS verbal subscore is 5. GCS motor subscore is 6.  Skin: No rash noted.  Psychiatric: She has a normal mood and affect.  Nursing note and vitals reviewed.   ED Course  Procedures (including critical care time)  Patient presents with "I'm not feeling well" symptom has been ongoing for nearly a year. She is concerned of feeling dehydrated from having diarrhea without any abdominal pain. Her labs are reassuring and no evidence of dehydration or lateralized abnormalities. She has no focal tenderness on exam. She is mentating appropriately. She has full strength to all 4 extremities. She reports having intermittent bouts of diarrhea throughout her life. She is not on any recent antibiotic concerning for C. difficile. Plan to give IV fluid hydration and will monitor.  10:08 PM Labs are reassuring.  Pt received IVF and felt better.  Pt to f/u with PCP for further care.  Care discussed with Dr. Sabra Heck who has evaluated pt and agrees with plan.   Labs Review Labs Reviewed  COMPREHENSIVE METABOLIC PANEL - Abnormal; Notable for the following:    GFR calc non Af Amer 50 (*)    GFR calc Af Amer 58 (*)    All other components within normal limits  URINALYSIS, ROUTINE W REFLEX MICROSCOPIC - Abnormal; Notable for the following:    Hgb urine dipstick TRACE (*)    Ketones, ur 15 (*)    Leukocytes, UA  SMALL (*)    All other components within normal limits  URINE MICROSCOPIC-ADD ON - Abnormal; Notable for the following:    Squamous Epithelial / LPF FEW (*)    Bacteria, UA MANY (*)    All other components within normal limits  CBC WITH DIFFERENTIAL/PLATELET    Imaging Review Dg Chest 2 View  10/28/2014   CLINICAL DATA:  Cough for 2 months  EXAM: CHEST  2 VIEW  COMPARISON:  None.  FINDINGS: Normal mediastinum and cardiac silhouette. Normal pulmonary vasculature. No evidence of effusion, infiltrate, or pneumothorax. No acute bony abnormality.  IMPRESSION: No acute cardiopulmonary process.   Electronically Signed   By: Suzy Bouchard M.D.   On: 10/28/2014 21:28     EKG Interpretation   Date/Time:  Saturday October 28 2014 20:09:40 EST Ventricular Rate:  87 PR Interval:  163 QRS Duration: 119 QT Interval:  441 QTC Calculation: 531 R Axis:   4 Text Interpretation:  Sinus rhythm Nonspecific intraventricular conduction  delay Since last tracing QRS has widened, otherwise no changes Abnormal  ekg Confirmed by Sabra Heck  MD, BRIAN ([id number]) on 10/28/2014 8:14:20 PM      MDM   Final diagnoses:  Cough  Dehydration    BP 145/65 mmHg  Pulse 84  Temp(Src) 97.7 F (36.5 C)  Resp 17  Ht 5\' 4"  (1.626 m)  Wt 135 lb (61.236 kg)  BMI 23.16 kg/m2  SpO2 98%  I have reviewed nursing notes and vital signs. I personally reviewed the imaging tests through PACS system  I reviewed available ER/hospitalization records thought the EMR     Domenic Moras, PA-C 10/28/14 2208  Johnna Acosta, MD 10/29/14 [phone]

## 2014-10-28 NOTE — Discharge Instructions (Signed)
Please follow up closely with your doctor for further care.  Dehydration, Adult Dehydration means your body does not have as much fluid as it needs. Your kidneys, brain, and heart will not work properly without the right amount of fluids and salt.  HOME CARE  Ask your doctor how to replace body fluid losses (rehydrate).  Drink enough fluids to keep your pee (urine) clear or pale yellow.  Drink small amounts of fluids often if you feel sick to your stomach (nauseous) or throw up (vomit).  Eat like you normally do.  Avoid:  Foods or drinks high in sugar.  Bubbly (carbonated) drinks.  Juice.  Very hot or cold fluids.  Drinks with caffeine.  Fatty, greasy foods.  Alcohol.  Tobacco.  Eating too much.  Gelatin desserts.  Wash your hands to avoid spreading germs (bacteria, viruses).  Only take medicine as told by your doctor.  Keep all doctor visits as told. GET HELP RIGHT AWAY IF:   You cannot drink something without throwing up.  You get worse even with treatment.  Your vomit has blood in it or looks greenish.  Your poop (stool) has blood in it or looks black and tarry.  You have not peed in 6 to 8 hours.  You pee a small amount of very dark pee.  You have a fever.  You pass out (faint).  You have belly (abdominal) pain that gets worse or stays in one spot (localizes).  You have a rash, stiff neck, or bad headache.  You get easily annoyed, sleepy, or are hard to wake up.  You feel weak, dizzy, or very thirsty. MAKE SURE YOU:   Understand these instructions.  Will watch your condition.  Will get help right away if you are not doing well or get worse. Document Released: 06/14/2009 Document Revised: 11/10/2011 Document Reviewed: 04/07/2011 [facility] Patient Information 2015 [place], [place]. This information is not intended to replace advice given to you by your health care provider. Make sure you discuss any questions you have with your health care  provider.

## 2014-10-28 NOTE — ED Provider Notes (Signed)
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 She has no chest pain shortness of breath cough or palpitations. On exam she has a soft nontender abdomen, clear heart and lung sounds, normal neurologic exam, no peripheral edema, moist mucous membranes. She appears very benign, labs reviewed thus far, they appear normal, check urinalysis to evaluate for dehydration signs, otherwise the patient has already received IV fluids and appears stable to follow up with her outpatient physician. She reports having a very good relationship with her doctor and can follow-up closely.  Medical screening examination/treatment/procedure(s) were conducted as a shared visit with non-physician practitioner(s) and myself.  I personally evaluated the patient during the encounter.  Clinical Impression:   Final diagnoses:  Cough  Dehydration     EKG Interpretation  Date/Time:  Saturday October 28 2014 20:09:40 EST Ventricular Rate:  87 PR Interval:  163 QRS Duration: 119 QT Interval:  441 QTC Calculation: 531 R Axis:   4 Text Interpretation:  Sinus rhythm Nonspecific intraventricular conduction delay Since last tracing QRS has widened, otherwise no changes Abnormal ekg Confirmed by Sabra Heck  MD, Odes Lolli ([id number]) on 10/28/2014 8:14:20 PM        Johnna Acosta, MD 10/29/14 [phone]

## 2014-10-31 ENCOUNTER — Telehealth: Payer: Self-pay | Admitting: *Deleted

## 2014-10-31 ENCOUNTER — Ambulatory Visit (INDEPENDENT_AMBULATORY_CARE_PROVIDER_SITE_OTHER): Payer: Medicare Other | Admitting: *Deleted

## 2014-10-31 VITALS — BP 112/70 | HR 69

## 2014-10-31 DIAGNOSIS — R002 Palpitations: Secondary | ICD-10-CM

## 2014-10-31 NOTE — Telephone Encounter (Signed)
Thx for update.  Zandra Abts MD

## 2014-10-31 NOTE — Progress Notes (Signed)
Pt c/o being drained, being dizzy off and on, no energy. Went to ED for these symptoms. States she has worried so much she thinks it is affecting her BP although states her BP was was 110/70 and same range as today.

## 2014-10-31 NOTE — Telephone Encounter (Signed)
Pt states she did see Dr. Scotty Court who gave her low dose of valium for symptoms. Pt was not clear on what dose. Dr. Scotty Court recommended that she f/u with ENT which she has appointment. Will forward to Dr. Harl Bowie as Juluis Rainier

## 2014-11-02 DIAGNOSIS — C4441 Basal cell carcinoma of skin of scalp and neck: Secondary | ICD-10-CM | POA: Diagnosis not present

## 2014-11-06 ENCOUNTER — Telehealth: Payer: Self-pay | Admitting: Cardiology

## 2014-11-06 ENCOUNTER — Ambulatory Visit (INDEPENDENT_AMBULATORY_CARE_PROVIDER_SITE_OTHER): Payer: Medicare Other | Admitting: Cardiology

## 2014-11-06 ENCOUNTER — Encounter: Payer: Self-pay | Admitting: Cardiology

## 2014-11-06 VITALS — BP 132/80 | HR 69 | Ht 64.0 in | Wt 140.0 lb

## 2014-11-06 DIAGNOSIS — R42 Dizziness and giddiness: Secondary | ICD-10-CM | POA: Diagnosis not present

## 2014-11-06 NOTE — Telephone Encounter (Signed)
Pt is lightheaded and states BP has been high and low HR ranging 65-100 today. Will forward to Dr. Harl Bowie if OK to add today at 4pm opening

## 2014-11-06 NOTE — Patient Instructions (Signed)
Your physician recommends that you schedule a follow-up appointment in: as scheduled in the [facility] 11/23/14 at 2 pm    Your physician recommends that you continue on your current medications as directed. Please refer to the Current Medication list given to you today.    Thank you for choosing [facility] !

## 2014-11-06 NOTE — Progress Notes (Signed)
Clinical Summary Rhonda Andrews is a 74 y.o.female seen today for follow up of the following medical problems.   1. Palpitations  - previous holter 05/2009 showed no significant arrhythmias.  - her symptoms have been well controlled on propanolol. In the past she was switched to Toprol with recurrence of symptoms, changed back to propanolol w/ resolution of symptoms   2. Dizziness - seen in ER 10/29/14, chief complaint was "not feeling well". Described symptoms of chronic dizziness, diarrhea. Vitals were normal. Given IVF and discharged - Labs: Na 140 K 4.7 Cr 1.09 Hgb 14.9 neg UA for injection. CXR on acute process.   - feeling lightheaded, ongoing for year but has gotten worst over the last month. Mainly occurs with standing. Can have mild symptoms with sitting. No feeling of room spinning.  - fairly limited fluid intake daily. Only drinks bottled of water x2. No coffee, no sodas, no tea, no EtOH.    3. Vertigo - followed by ENT  4. Chronic diarrhea - 3-4 loose stools per day - followed by pcp   Past Medical History  Diagnosis Date  . Heart rate fast   . 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 Prednisone Other (See Comments)    Heart races     Current Outpatient Prescriptions  Medication Sig Dispense Refill  . Calcium Carbonate-Vitamin D (CALCIUM + D PO) Take 1 tablet by mouth daily with lunch. Calcium 1200 mg, vitamin D3 1000 units    . fluticasone (FLONASE) 50 MCG/ACT nasal spray Place 1 spray into both nostrils daily as needed for allergies or rhinitis. (Patient not taking: Reported on 10/28/2014) 16 g 2  . loperamide (IMODIUM A-D) 2 MG tablet Take 2 mg by mouth daily as needed for diarrhea or loose stools.    . meclizine (ANTIVERT) 12.5 MG tablet Take 1 tablet (12.5 mg total) by mouth 2 (two) times daily as needed for dizziness. (Patient taking differently: Take 3.125 mg by mouth 2 (two) times daily as needed for dizziness. 1/4 tablet) 30 tablet  0  . metroNIDAZOLE (FLAGYL) 500 MG tablet Take 1 tablet (500 mg total) by mouth 2 (two) times daily. (Patient taking differently: Take 500 mg by mouth See admin instructions. Take for 2 times daily for 7 days as needed for chronic diarrhea) 20 tablet 0  . propranolol ER (INDERAL LA) 60 MG 24 hr capsule Take 1 capsule (60 mg total) by mouth daily. (Patient taking differently: Take 60 mg by mouth daily with supper. ) 30 capsule 6  . Triamcinolone Acetonide (NASACORT AQ NA) Place 2 sprays into both nostrils daily as needed (dizziness from sinus).     No current facility-administered medications for this visit.     Past Surgical History  Procedure Laterality Date  . Lactose intolerant    . Cholecystectomy  3 yrs ago  . Abdominal hysterectomy      ovaries remained  . Precancerous  tissue - nose  February 14-2014     Allergies  Allergen Reactions  . Prednisone Other (See Comments)    Heart races      No family history on file.   Social History Ms. Boot reports that she has never smoked. She has never used smokeless tobacco. Ms. Goodhart reports that she does not drink alcohol.   Review of Systems CONSTITUTIONAL: No weight loss, fever, chills, weakness or fatigue.  HEENT: Eyes: No visual loss, blurred vision, double vision or yellow sclerae.No hearing loss, sneezing,  congestion, runny nose or sore throat.  SKIN: No rash or itching.  CARDIOVASCULAR: per HPI RESPIRATORY: No shortness of breath, cough or sputum.  GASTROINTESTINAL: No anorexia, nausea, vomiting or diarrhea. No abdominal pain or blood.  GENITOURINARY: No burning on urination, no polyuria NEUROLOGICAL: No headache, dizziness, syncope, paralysis, ataxia, numbness or tingling in the extremities. No change in bowel or bladder control.  MUSCULOSKELETAL: No muscle, back pain, joint pain or stiffness.  LYMPHATICS: No enlarged nodes. No history of splenectomy.  PSYCHIATRIC: No history of depression or anxiety.    ENDOCRINOLOGIC: No reports of sweating, cold or heat intolerance. No polyuria or polydipsia.  Marland Kitchen   Physical Examination p 69 bp 132/80 Wt 140 lbs BMI 24  Orthostatics Lying: p 69 bp 132/80 Sitting p 74 bp 118/80 Standing p 81 bp 114/78  Gen: resting comfortably, no acute distress HEENT: no scleral icterus, pupils equal round and reactive, no palptable cervical adenopathy,  CV: RRR, no m/r/g, no JVD Resp: Clear to auscultation bilaterally GI: abdomen is soft, non-tender, non-distended, normal bowel sounds, no hepatosplenomegaly MSK: extremities are warm, no edema.  Skin: warm, no rash Neuro:  no focal deficits Psych: appropriate affect     Assessment and Plan  1. Palpitations  - symptoms well controlled on propanolol, continue current therapy  2. Dizziness - different from her previous vertigo. Symptoms mainly with standing - poor daily fluid intake. She has long term issues with diarrhea. She is orthostatic in clinic, symptoms likely due ot hypovolemia - encouraged increased oral intake with water and electrolyte rich fluids.   Arnoldo Lenis, M.D.

## 2014-11-06 NOTE — Telephone Encounter (Signed)
Pt made aware and will go to [place]

## 2014-11-06 NOTE — Telephone Encounter (Signed)
Dr. Harl Bowie does not have any openings this week or next week.  She is willing to travel whichever location he can see her in the soonest.

## 2014-11-06 NOTE — Telephone Encounter (Signed)
Today at 4pm is fine in [place]

## 2014-11-08 DIAGNOSIS — N183 Chronic kidney disease, stage 3 (moderate): Secondary | ICD-10-CM | POA: Diagnosis not present

## 2014-11-08 DIAGNOSIS — R531 Weakness: Secondary | ICD-10-CM | POA: Diagnosis not present

## 2014-11-23 ENCOUNTER — Encounter: Payer: Medicare Other | Admitting: Cardiology

## 2014-12-07 ENCOUNTER — Ambulatory Visit (INDEPENDENT_AMBULATORY_CARE_PROVIDER_SITE_OTHER): Payer: Medicare Other | Admitting: Otolaryngology

## 2014-12-07 DIAGNOSIS — H903 Sensorineural hearing loss, bilateral: Secondary | ICD-10-CM

## 2014-12-07 DIAGNOSIS — H9313 Tinnitus, bilateral: Secondary | ICD-10-CM

## 2014-12-07 DIAGNOSIS — R42 Dizziness and giddiness: Secondary | ICD-10-CM | POA: Diagnosis not present

## 2015-01-03 DIAGNOSIS — X030XXA Exposure to flames in controlled fire, not in building or structure, initial encounter: Secondary | ICD-10-CM | POA: Diagnosis not present

## 2015-01-03 DIAGNOSIS — Z9049 Acquired absence of other specified parts of digestive tract: Secondary | ICD-10-CM | POA: Diagnosis not present

## 2015-01-03 DIAGNOSIS — M9903 Segmental and somatic dysfunction of lumbar region: Secondary | ICD-10-CM | POA: Diagnosis not present

## 2015-01-03 DIAGNOSIS — Z90710 Acquired absence of both cervix and uterus: Secondary | ICD-10-CM | POA: Diagnosis not present

## 2015-01-03 DIAGNOSIS — T2019XA Burn of first degree of multiple sites of head, face, and neck, initial encounter: Secondary | ICD-10-CM | POA: Diagnosis not present

## 2015-01-03 DIAGNOSIS — M47816 Spondylosis without myelopathy or radiculopathy, lumbar region: Secondary | ICD-10-CM | POA: Diagnosis not present

## 2015-01-03 DIAGNOSIS — T2017XA Burn of first degree of neck, initial encounter: Secondary | ICD-10-CM | POA: Diagnosis not present

## 2015-01-03 DIAGNOSIS — M5442 Lumbago with sciatica, left side: Secondary | ICD-10-CM | POA: Diagnosis not present

## 2015-01-03 DIAGNOSIS — T2010XA Burn of first degree of head, face, and neck, unspecified site, initial encounter: Secondary | ICD-10-CM | POA: Diagnosis not present

## 2015-01-03 DIAGNOSIS — M9901 Segmental and somatic dysfunction of cervical region: Secondary | ICD-10-CM | POA: Diagnosis not present

## 2015-01-03 DIAGNOSIS — M47812 Spondylosis without myelopathy or radiculopathy, cervical region: Secondary | ICD-10-CM | POA: Diagnosis not present

## 2015-01-04 DIAGNOSIS — M5442 Lumbago with sciatica, left side: Secondary | ICD-10-CM | POA: Diagnosis not present

## 2015-01-04 DIAGNOSIS — M546 Pain in thoracic spine: Secondary | ICD-10-CM | POA: Diagnosis not present

## 2015-01-04 DIAGNOSIS — M9901 Segmental and somatic dysfunction of cervical region: Secondary | ICD-10-CM | POA: Diagnosis not present

## 2015-01-04 DIAGNOSIS — M47816 Spondylosis without myelopathy or radiculopathy, lumbar region: Secondary | ICD-10-CM | POA: Diagnosis not present

## 2015-01-04 DIAGNOSIS — M9903 Segmental and somatic dysfunction of lumbar region: Secondary | ICD-10-CM | POA: Diagnosis not present

## 2015-01-04 DIAGNOSIS — M47812 Spondylosis without myelopathy or radiculopathy, cervical region: Secondary | ICD-10-CM | POA: Diagnosis not present

## 2015-01-12 DIAGNOSIS — M47812 Spondylosis without myelopathy or radiculopathy, cervical region: Secondary | ICD-10-CM | POA: Diagnosis not present

## 2015-01-12 DIAGNOSIS — M5442 Lumbago with sciatica, left side: Secondary | ICD-10-CM | POA: Diagnosis not present

## 2015-01-12 DIAGNOSIS — M546 Pain in thoracic spine: Secondary | ICD-10-CM | POA: Diagnosis not present

## 2015-01-12 DIAGNOSIS — M9903 Segmental and somatic dysfunction of lumbar region: Secondary | ICD-10-CM | POA: Diagnosis not present

## 2015-01-12 DIAGNOSIS — M47816 Spondylosis without myelopathy or radiculopathy, lumbar region: Secondary | ICD-10-CM | POA: Diagnosis not present

## 2015-01-12 DIAGNOSIS — M9901 Segmental and somatic dysfunction of cervical region: Secondary | ICD-10-CM | POA: Diagnosis not present

## 2015-01-17 ENCOUNTER — Encounter (INDEPENDENT_AMBULATORY_CARE_PROVIDER_SITE_OTHER): Payer: Self-pay | Admitting: Internal Medicine

## 2015-01-17 DIAGNOSIS — R197 Diarrhea, unspecified: Secondary | ICD-10-CM

## 2015-01-24 ENCOUNTER — Ambulatory Visit (INDEPENDENT_AMBULATORY_CARE_PROVIDER_SITE_OTHER): Payer: Medicare Other | Admitting: Internal Medicine

## 2015-01-24 ENCOUNTER — Encounter (INDEPENDENT_AMBULATORY_CARE_PROVIDER_SITE_OTHER): Payer: Self-pay | Admitting: Internal Medicine

## 2015-01-24 VITALS — BP 98/50 | HR 60 | Temp 98.5°F | Ht 64.0 in | Wt 140.1 lb

## 2015-01-24 DIAGNOSIS — R197 Diarrhea, unspecified: Secondary | ICD-10-CM | POA: Diagnosis not present

## 2015-01-24 MED ORDER — METRONIDAZOLE 250 MG PO TABS: 250.0000 mg | ORAL_TABLET | Freq: Three times a day (TID) | ORAL | Status: DC

## 2015-01-24 NOTE — Patient Instructions (Signed)
Rx for Flagyl sent to her pharmacy 
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
## 2015-01-26 ENCOUNTER — Telehealth (INDEPENDENT_AMBULATORY_CARE_PROVIDER_SITE_OTHER): Payer: Self-pay | Admitting: *Deleted

## 2015-01-26 NOTE — Telephone Encounter (Signed)
I have spoken with patient. She has taken Flagyl in the past. No rash. I advised her to take the Flagyl with food and she how she does

## 2015-01-26 NOTE — Telephone Encounter (Signed)
Patient states that she started the Flagyl on 01/24/15. Last night she has experienced horrible stomach cramps. She feels that it is the medication.She states that she saw Terri and ws told that id she had problems on this medication she may do blood work.Forwarded to Terri to address.

## 2015-01-30 DIAGNOSIS — R197 Diarrhea, unspecified: Secondary | ICD-10-CM | POA: Diagnosis not present

## 2015-01-30 NOTE — Telephone Encounter (Signed)
CMET and Gi pathogen

## 2015-01-31 LAB — GASTROINTESTINAL PATHOGEN PANEL PCR

## 2015-01-31 LAB — COMPREHENSIVE METABOLIC PANEL
ALT: 14 U/L (ref 0–35)
AST: 17 U/L (ref 0–37)
Albumin: 3.7 g/dL (ref 3.5–5.2)
Alkaline Phosphatase: 61 U/L (ref 39–117)
BUN: 19 mg/dL (ref 6–23)
CO2: 30 meq/L (ref 19–32)
Calcium: 8.8 mg/dL (ref 8.4–10.5)
Chloride: 103 mEq/L (ref 96–112)
Creat: 0.8 mg/dL (ref 0.50–1.10)
Glucose, Bld: 107 mg/dL — ABNORMAL HIGH (ref 70–99)
POTASSIUM: 4.2 meq/L (ref 3.5–5.3)
Sodium: 140 mEq/L (ref 135–145)
Total Bilirubin: 0.4 mg/dL (ref 0.2–1.2)
Total Protein: 5.8 g/dL — ABNORMAL LOW (ref 6.0–8.3)

## 2015-02-02 ENCOUNTER — Other Ambulatory Visit (INDEPENDENT_AMBULATORY_CARE_PROVIDER_SITE_OTHER): Payer: Self-pay | Admitting: Internal Medicine

## 2015-02-02 DIAGNOSIS — R197 Diarrhea, unspecified: Secondary | ICD-10-CM | POA: Diagnosis not present

## 2015-02-05 DIAGNOSIS — A0472 Enterocolitis due to Clostridium difficile, not specified as recurrent: Secondary | ICD-10-CM

## 2015-02-05 HISTORY — DX: Enterocolitis due to Clostridium difficile, not specified as recurrent: A04.72

## 2015-02-05 LAB — GASTROINTESTINAL PATHOGEN PANEL PCR
C. DIFFICILE TOX A/B, PCR: POSITIVE — AB
CRYPTOSPORIDIUM, PCR: NEGATIVE
Campylobacter, PCR: NEGATIVE
E COLI 0157, PCR: NEGATIVE
E coli (ETEC) LT/ST PCR: NEGATIVE
E coli (STEC) stx1/stx2, PCR: NEGATIVE
GIARDIA LAMBLIA, PCR: NEGATIVE
Norovirus, PCR: NEGATIVE
Rotavirus A, PCR: NEGATIVE
Salmonella, PCR: NEGATIVE
Shigella, PCR: NEGATIVE

## 2015-02-06 ENCOUNTER — Telehealth (INDEPENDENT_AMBULATORY_CARE_PROVIDER_SITE_OTHER): Payer: Self-pay | Admitting: Internal Medicine

## 2015-02-06 DIAGNOSIS — A0472 Enterocolitis due to Clostridium difficile, not specified as recurrent: Secondary | ICD-10-CM

## 2015-02-06 MED ORDER — VANCOMYCIN HCL 125 MG PO CAPS: 125.0000 mg | ORAL_CAPSULE | Freq: Four times a day (QID) | ORAL | Status: DC

## 2015-02-06 NOTE — Telephone Encounter (Signed)
Results of GI given to patient. She has C-diff. Will treat with Vancomycin 125mg  QID x 10 days. Probiotic x 30 days.    Butch Penny, OV in 1 month

## 2015-02-08 DIAGNOSIS — R531 Weakness: Secondary | ICD-10-CM | POA: Diagnosis not present

## 2015-02-08 DIAGNOSIS — I10 Essential (primary) hypertension: Secondary | ICD-10-CM | POA: Diagnosis not present

## 2015-02-12 ENCOUNTER — Telehealth (INDEPENDENT_AMBULATORY_CARE_PROVIDER_SITE_OTHER): Payer: Self-pay | Admitting: *Deleted

## 2015-02-12 NOTE — Telephone Encounter (Signed)
Apt has been scheduled for 03/19/15 with Dr. Laural Golden.

## 2015-02-12 NOTE — Telephone Encounter (Signed)
Rhonda Andrews said the FLAGYL makes her light headed and weak. The return phone number is [phone].

## 2015-02-12 NOTE — Telephone Encounter (Signed)
Take Flagyl twice a day. Stools are more formed.  Zofran as needed for nausea

## 2015-02-16 ENCOUNTER — Telehealth (INDEPENDENT_AMBULATORY_CARE_PROVIDER_SITE_OTHER): Payer: Self-pay | Admitting: Internal Medicine

## 2015-02-16 DIAGNOSIS — A0472 Enterocolitis due to Clostridium difficile, not specified as recurrent: Secondary | ICD-10-CM

## 2015-02-16 NOTE — Telephone Encounter (Signed)
She will have GI pathogen next week. If positive will restart the Vancomycin. She cannot take the Flagyl.

## 2015-02-16 NOTE — Telephone Encounter (Signed)
She will have drawn next week. If positive she will start the Vancomycin

## 2015-02-22 DIAGNOSIS — A047 Enterocolitis due to Clostridium difficile: Secondary | ICD-10-CM | POA: Diagnosis not present

## 2015-02-26 ENCOUNTER — Other Ambulatory Visit: Payer: Self-pay

## 2015-02-27 ENCOUNTER — Encounter (INDEPENDENT_AMBULATORY_CARE_PROVIDER_SITE_OTHER): Payer: Self-pay | Admitting: Internal Medicine

## 2015-02-27 LAB — GASTROINTESTINAL PATHOGEN PANEL PCR
C. DIFFICILE TOX A/B, PCR: POSITIVE — AB
CAMPYLOBACTER, PCR: NEGATIVE
Cryptosporidium, PCR: NEGATIVE
E COLI (ETEC) LT/ST, PCR: NEGATIVE
E coli (STEC) stx1/stx2, PCR: NEGATIVE
E coli 0157, PCR: NEGATIVE
GIARDIA LAMBLIA, PCR: NEGATIVE
Norovirus, PCR: NEGATIVE
Rotavirus A, PCR: NEGATIVE
Salmonella, PCR: NEGATIVE
Shigella, PCR: NEGATIVE

## 2015-03-11 ENCOUNTER — Telehealth: Payer: Self-pay | Admitting: Internal Medicine

## 2015-03-11 ENCOUNTER — Other Ambulatory Visit (HOSPITAL_COMMUNITY)
Admission: RE | Admit: 2015-03-11 | Discharge: 2015-03-11 | Disposition: A | Discharge status: Home / Self Care | Payer: Medicare Other | Source: Ambulatory Visit | Attending: Internal Medicine | Admitting: Internal Medicine

## 2015-03-11 ENCOUNTER — Other Ambulatory Visit: Payer: Self-pay | Admitting: Internal Medicine

## 2015-03-11 DIAGNOSIS — R197 Diarrhea, unspecified: Secondary | ICD-10-CM | POA: Diagnosis not present

## 2015-03-11 NOTE — Telephone Encounter (Signed)
Pt called; just finished 10 day course of vancomycin yesterday; intolerant of flagyl; positive Cdiff 6/3and 6/23. She states diarrhea was getting better until past 24 hrs when it worsened.  No nausea or vomiting.  Doesn't feel sick otherwise.  States she's had diarrhea "ever since she was born"- reports a history of bacterial overgrowth as well.    I recommended she maintain hydration.  Stool collection for repeat Cdiff arranged.  I spoke to [place] at [facility] - will be done there.   further recommendations per Dr. Laural Golden tomorrow.

## 2015-03-12 ENCOUNTER — Encounter (INDEPENDENT_AMBULATORY_CARE_PROVIDER_SITE_OTHER): Payer: Self-pay | Admitting: Internal Medicine

## 2015-03-12 ENCOUNTER — Ambulatory Visit (INDEPENDENT_AMBULATORY_CARE_PROVIDER_SITE_OTHER): Payer: Medicare Other | Admitting: Internal Medicine

## 2015-03-12 ENCOUNTER — Other Ambulatory Visit: Payer: Self-pay

## 2015-03-12 ENCOUNTER — Other Ambulatory Visit (HOSPITAL_COMMUNITY)
Admission: RE | Admit: 2015-03-12 | Discharge: 2015-03-12 | Disposition: A | Discharge status: Home / Self Care | Payer: Medicare Other | Source: Ambulatory Visit | Attending: Internal Medicine | Admitting: Internal Medicine

## 2015-03-12 ENCOUNTER — Other Ambulatory Visit: Payer: Self-pay | Admitting: Internal Medicine

## 2015-03-12 DIAGNOSIS — R197 Diarrhea, unspecified: Secondary | ICD-10-CM

## 2015-03-12 LAB — CLOSTRIDIUM DIFFICILE BY PCR: Toxigenic C. Difficile by PCR: NEGATIVE

## 2015-03-12 NOTE — Telephone Encounter (Signed)
She feels 50% better. Her C-diff is negative and she was advised. She is to drink plenty of fluids. I advised her to continue eating and forcing fluids. She will call in 2 days and let me know how she feels.

## 2015-03-15 ENCOUNTER — Telehealth (INDEPENDENT_AMBULATORY_CARE_PROVIDER_SITE_OTHER): Payer: Self-pay | Admitting: *Deleted

## 2015-03-15 NOTE — Telephone Encounter (Signed)
Rhonda Andrews said she is very dizzy and light headed but the diarrhea is better. Her return phone number is [phone].

## 2015-03-16 NOTE — Telephone Encounter (Signed)
Drink plenty of fluids. Imodium as need

## 2015-03-17 ENCOUNTER — Encounter (HOSPITAL_COMMUNITY): Payer: Self-pay | Admitting: Emergency Medicine

## 2015-03-17 ENCOUNTER — Emergency Department (HOSPITAL_COMMUNITY)
Admission: EM | Admit: 2015-03-17 | Discharge: 2015-03-17 | Disposition: A | Discharge status: Home / Self Care | Payer: Medicare Other | Attending: Emergency Medicine | Admitting: Emergency Medicine

## 2015-03-17 DIAGNOSIS — N390 Urinary tract infection, site not specified: Secondary | ICD-10-CM | POA: Diagnosis not present

## 2015-03-17 DIAGNOSIS — Z792 Long term (current) use of antibiotics: Secondary | ICD-10-CM | POA: Insufficient documentation

## 2015-03-17 DIAGNOSIS — Z79899 Other long term (current) drug therapy: Secondary | ICD-10-CM | POA: Diagnosis not present

## 2015-03-17 DIAGNOSIS — R002 Palpitations: Secondary | ICD-10-CM | POA: Insufficient documentation

## 2015-03-17 DIAGNOSIS — K529 Noninfective gastroenteritis and colitis, unspecified: Secondary | ICD-10-CM

## 2015-03-17 DIAGNOSIS — E860 Dehydration: Secondary | ICD-10-CM | POA: Diagnosis not present

## 2015-03-17 DIAGNOSIS — R197 Diarrhea, unspecified: Secondary | ICD-10-CM | POA: Insufficient documentation

## 2015-03-17 LAB — CBC WITH DIFFERENTIAL/PLATELET
Basophils Absolute: 0 10*3/uL (ref 0.0–0.1)
Basophils Relative: 1 % (ref 0–1)
EOS PCT: 1 % (ref 0–5)
Eosinophils Absolute: 0.1 10*3/uL (ref 0.0–0.7)
HCT: 47.3 % — ABNORMAL HIGH (ref 36.0–46.0)
HEMOGLOBIN: 15.8 g/dL — AB (ref 12.0–15.0)
LYMPHS PCT: 39 % (ref 12–46)
Lymphs Abs: 2.9 10*3/uL (ref 0.7–4.0)
MCH: 30.3 pg (ref 26.0–34.0)
MCHC: 33.4 g/dL (ref 30.0–36.0)
MCV: 90.8 fL (ref 78.0–100.0)
Monocytes Absolute: 0.6 10*3/uL (ref 0.1–1.0)
Monocytes Relative: 9 % (ref 3–12)
NEUTROS PCT: 50 % (ref 43–77)
Neutro Abs: 3.8 10*3/uL (ref 1.7–7.7)
PLATELETS: 252 10*3/uL (ref 150–400)
RBC: 5.21 MIL/uL — ABNORMAL HIGH (ref 3.87–5.11)
RDW: 12.9 % (ref 11.5–15.5)
WBC: 7.4 10*3/uL (ref 4.0–10.5)

## 2015-03-17 LAB — URINALYSIS, ROUTINE W REFLEX MICROSCOPIC
BILIRUBIN URINE: NEGATIVE
GLUCOSE, UA: NEGATIVE mg/dL
Ketones, ur: NEGATIVE mg/dL
Nitrite: NEGATIVE
Protein, ur: NEGATIVE mg/dL
Specific Gravity, Urine: 1.01 (ref 1.005–1.030)
Urobilinogen, UA: 0.2 mg/dL (ref 0.0–1.0)
pH: 6 (ref 5.0–8.0)

## 2015-03-17 LAB — BASIC METABOLIC PANEL
Anion gap: 7 (ref 5–15)
BUN: 18 mg/dL (ref 6–20)
CO2: 30 mmol/L (ref 22–32)
Calcium: 9.1 mg/dL (ref 8.9–10.3)
Chloride: 102 mmol/L (ref 101–111)
Creatinine, Ser: 0.84 mg/dL (ref 0.44–1.00)
GFR calc Af Amer: >60 mL/min (ref >60)
Glucose, Bld: 128 mg/dL — ABNORMAL HIGH (ref 65–99)
POTASSIUM: 4 mmol/L (ref 3.5–5.1)
SODIUM: 139 mmol/L (ref 135–145)

## 2015-03-17 LAB — URINE MICROSCOPIC-ADD ON

## 2015-03-17 MED ORDER — CEPHALEXIN 500 MG PO CAPS
500.0000 mg | ORAL_CAPSULE | Freq: Once | ORAL | Status: AC
  Administered 2015-03-17: 500 mg via ORAL
  Filled 2015-03-17: qty 1

## 2015-03-17 MED ORDER — SODIUM CHLORIDE 0.9 % IV SOLN
1000.0000 mL | Freq: Once | INTRAVENOUS | Status: AC
  Administered 2015-03-17: 1000 mL via INTRAVENOUS

## 2015-03-17 MED ORDER — SODIUM CHLORIDE 0.9 % IV SOLN
1000.0000 mL | INTRAVENOUS | Status: DC
  Administered 2015-03-17: 1000 mL via INTRAVENOUS

## 2015-03-17 MED ORDER — CEPHALEXIN 500 MG PO CAPS: 500.0000 mg | ORAL_CAPSULE | Freq: Four times a day (QID) | ORAL | Status: DC

## 2015-03-17 NOTE — Discharge Instructions (Signed)
Your chemistries are within normal limits. Your complete blood count is negative for evidence of a systemic infection. Your urine analysis suggest a urinary tract infection. Please increase water and Gatorade. Please use Keflex 4 times daily until all taken. Please have your urine rechecked by Dr. Scotty Andrews in 7-10 days. Urinary Tract Infection Urinary tract infections (UTIs) can develop anywhere along your urinary tract. Your urinary tract is your body's drainage system for removing wastes and extra water. Your urinary tract includes two kidneys, two ureters, a bladder, and a urethra. Your kidneys are a pair of bean-shaped organs. Each kidney is about the size of your fist. They are located below your ribs, one on each side of your spine. CAUSES Infections are caused by microbes, which are microscopic organisms, including fungi, viruses, and bacteria. These organisms are so small that they can only be seen through a microscope. Bacteria are the microbes that most commonly cause UTIs. SYMPTOMS  Symptoms of UTIs may vary by age and gender of the patient and by the location of the infection. Symptoms in young women typically include a frequent and intense urge to urinate and a painful, burning feeling in the bladder or urethra during urination. Older women and men are more likely to be tired, shaky, and weak and have muscle aches and abdominal pain. A fever may mean the infection is in your kidneys. Other symptoms of a kidney infection include pain in your back or sides below the ribs, nausea, and vomiting. DIAGNOSIS To diagnose a UTI, your caregiver will ask you about your symptoms. Your caregiver also will ask to provide a urine sample. The urine sample will be tested for bacteria and white blood cells. White blood cells are made by your body to help fight infection. TREATMENT  Typically, UTIs can be treated with medication. Because most UTIs are caused by a bacterial infection, they usually can be treated  with the use of antibiotics. The choice of antibiotic and length of treatment depend on your symptoms and the type of bacteria causing your infection. HOME CARE INSTRUCTIONS  If you were prescribed antibiotics, take them exactly as your caregiver instructs you. Finish the medication even if you feel better after you have only taken some of the medication.  Drink enough water and fluids to keep your urine clear or pale yellow.  Avoid caffeine, tea, and carbonated beverages. They tend to irritate your bladder.  Empty your bladder often. Avoid holding urine for long periods of time.  Empty your bladder before and after sexual intercourse.  After a bowel movement, women should cleanse from front to back. Use each tissue only once. SEEK MEDICAL CARE IF:   You have back pain.  You develop a fever.  Your symptoms do not begin to resolve within 3 days. SEEK IMMEDIATE MEDICAL CARE IF:   You have severe back pain or lower abdominal pain.  You develop chills.  You have nausea or vomiting.  You have continued burning or discomfort with urination. MAKE SURE YOU:   Understand these instructions.  Will watch your condition.  Will get help right away if you are not doing well or get worse. Document Released: 05/28/2005 Document Revised: 02/17/2012 Document Reviewed: 09/26/2011 [facility] Patient Information 2015 [place], [place]. This information is not intended to replace advice given to you by your health care provider. Make sure you discuss any questions you have with your health care provider.  Diarrhea Diarrhea is watery poop (stool). It can make you feel weak, tired, thirsty, or  give you a dry mouth (signs of dehydration). Watery poop is a sign of another problem, most often an infection. It often lasts 2-3 days. It can last longer if it is a sign of something serious. Take care of yourself as told by your doctor. HOME CARE   Drink 1 cup (8 ounces) of fluid each time you have watery  poop.  Do not drink the following fluids:  Those that contain simple sugars (fructose, glucose, galactose, lactose, sucrose, maltose).  Sports drinks.  Fruit juices.  Whole milk products.  Sodas.  Drinks with caffeine (coffee, tea, soda) or alcohol.  Oral rehydration solution may be used if the doctor says it is okay. You may make your own solution. Follow this recipe:   - teaspoon table salt.   teaspoon baking soda.   teaspoon salt substitute containing potassium chloride.  1 tablespoons sugar.  1 liter (34 ounces) of water.  Avoid the following foods:  High fiber foods, such as raw fruits and vegetables.  Nuts, seeds, and whole grain breads and cereals.   Those that are sweetened with sugar alcohols (xylitol, sorbitol, mannitol).  Try eating the following foods:  Starchy foods, such as rice, toast, pasta, low-sugar cereal, oatmeal, baked potatoes, crackers, and bagels.  Bananas.  Applesauce.  Eat probiotic-rich foods, such as yogurt and milk products that are fermented.  Wash your hands well after each time you have watery poop.  Only take medicine as told by your doctor.  Take a warm bath to help lessen burning or pain from having watery poop. GET HELP RIGHT AWAY IF:   You cannot drink fluids without throwing up (vomiting).  You keep throwing up.  You have blood in your poop, or your poop looks black and tarry.  You do not pee (urinate) in 6-8 hours, or there is only a small amount of very dark pee.  You have belly (abdominal) pain that gets worse or stays in the same spot (localizes).  You are weak, dizzy, confused, or light-headed.  You have a very bad headache.  Your watery poop gets worse or does not get better.  You have a fever or lasting symptoms for more than 2-3 days.  You have a fever and your symptoms suddenly get worse. MAKE SURE YOU:   Understand these instructions.  Will watch your condition.  Will get help right away if  you are not doing well or get worse. Document Released: 02/04/2008 Document Revised: 01/02/2014 Document Reviewed: 04/25/2012 [facility] Patient Information 2015 [place], [place]. This information is not intended to replace advice given to you by your health care provider. Make sure you discuss any questions you have with your health care provider.

## 2015-03-17 NOTE — ED Provider Notes (Signed)
CSN: [id number]     Arrival date & time 03/17/15  2297 History   First MD Initiated Contact with Patient 03/17/15 [phone]     Chief Complaint  Patient presents with  . Diarrhea     (Consider location/radiation/quality/duration/timing/severity/associated sxs/prior Treatment) HPI Comments: Patient is a 74 year old female who presents to the emergency department with a complaint of diarrhea, and lightheadedness, and feeling "washed out".  Patient states that she was "born with diarrhea". She states however that she noticed the most changes in her condition since May of this year. She states that earlier she was diagnosed with C. difficile. She was treated with various medications and was told that occurred C. difficile had resolved. She was actually tested for C. difficile a week ago and it was found to be negative. She has not had any fever or chills. She has not noted any blood in the stool. There's been no procedure, or trauma to the abdomen area. The patient states that she drinks water, she is also tried to drink juices or Gatorade, but states that they seem to contribute to her diarrhea. There's been no syncopal episodes, but the patient does complain of feeling lightheaded. She denies the room spinning or her spinning, only feeling lightheaded and as though she may fall. Patient is followed by gastroenterology for this issue. She is scheduled for follow-up with gastroenterology in about 2 days.  The history is provided by the patient.    Past Medical History  Diagnosis Date  . Heart rate fast   . 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 Lactose intolerant    . Cholecystectomy  3 yrs ago  . Abdominal hysterectomy      ovaries remained  . Precancerous  tissue - nose  February 14-2014   No family history on file. History  Substance Use Topics  . Smoking status: Never Smoker   . Smokeless tobacco: Never Used  . Alcohol Use: No   OB History    No data  available     Review of Systems  Cardiovascular: Positive for palpitations.  Gastrointestinal: Positive for diarrhea. Negative for vomiting, abdominal pain and blood in stool.  All other systems reviewed and are negative.     Allergies  Prednisone  Home Medications   Prior to Admission medications   Medication Sig Start Date End Date Taking? Authorizing Provider  bacitracin 500 UNIT/GM ointment Apply 1 application topically 2 (two) times daily.    Historical Provider, MD  Calcium Carbonate-Vitamin D (CALCIUM + D PO) Take 1 tablet by mouth daily with lunch. Calcium 1200 mg, vitamin D3 1000 units    Historical Provider, MD  loperamide (IMODIUM A-D) 2 MG tablet Take 2 mg by mouth daily as needed for diarrhea or loose stools.    Historical Provider, MD  metroNIDAZOLE (FLAGYL) 250 MG tablet Take 1 tablet (250 mg total) by mouth 3 (three) times daily. 01/24/15   Butch Penny, NP  propranolol ER (INDERAL LA) 60 MG 24 hr capsule Take 1 capsule (60 mg total) by mouth daily. Patient taking differently: Take 30 mg by mouth daily with supper.  05/27/13   Arnoldo Lenis, MD  Triamcinolone Acetonide (NASACORT AQ NA) Place 2 sprays into both nostrils daily as needed (dizziness from sinus).    Historical Provider, MD  vancomycin (VANCOCIN) 125 MG capsule Take 1 capsule (125 mg total) by mouth 4 (four) times daily. 02/06/15   Butch Penny, NP   BP  129/76 mmHg  Pulse 89  Temp(Src) 97.5 F (36.4 C)  Resp 18  Ht 5\' 4"  (1.626 m)  Wt 134 lb (60.782 kg)  BMI 22.99 kg/m2  SpO2 99% Physical Exam  Constitutional: She is oriented to person, place, and time. She appears well-developed and well-nourished.  Non-toxic appearance.  HENT:  Head: Normocephalic.  Right Ear: Tympanic membrane and external ear normal.  Left Ear: Tympanic membrane and external ear normal.  Mucous membranes moist.  Eyes: EOM and lids are normal. Pupils are equal, round, and reactive to light.  Neck: Normal range of motion.  Neck supple. Carotid bruit is not present.  Cardiovascular: Normal rate, regular rhythm, normal heart sounds, intact distal pulses and normal pulses.   Pulmonary/Chest: Breath sounds normal. No respiratory distress. She has no rales.  Abdominal: Soft. Bowel sounds are normal. She exhibits no distension. There is no tenderness. There is no rebound and no guarding.  Musculoskeletal: Normal range of motion. She exhibits no edema or tenderness.  Lymphadenopathy:       Head (right side): No submandibular adenopathy present.       Head (left side): No submandibular adenopathy present.    She has no cervical adenopathy.  Neurological: She is alert and oriented to person, place, and time. She has normal strength. No cranial nerve deficit or sensory deficit.  Skin: Skin is warm and dry.  Psychiatric: She has a normal mood and affect. Her speech is normal.  Nursing note and vitals reviewed.   ED Course  10:50 - patient has finished her first liter of fluids, but states she really doesn't feel a whole lot different. Urinalysis is pending. I have updated the patient on the status of her labs up to this point.   Procedures (including critical care time) Labs Review Labs Reviewed  CBC WITH DIFFERENTIAL/PLATELET  BASIC METABOLIC PANEL    Imaging Review No results found.   EKG Interpretation   Date/Time:  Saturday March 17 2015 08:42:21 EDT Ventricular Rate:  80 PR Interval:  159 QRS Duration: 122 QT Interval:  418 QTC Calculation: 482 R Axis:   -37 Text Interpretation:  Sinus rhythm Nonspecific IVCD with LAD Left  ventricular hypertrophy Anterior Q waves, possibly due to LVH No  significant change since last tracing Confirmed by POLLINA  MD,  [place] ([id number]) on 03/17/2015 8:44:08 AM      MDM  Urinalysis reveals a urinary tract infection. The electrocardiogram does not show any new changes or emergent changes. The vital signs are within normal limits. Patient is to follow-up with  gastroenterology in 2 days scheduled. Patient is to have her urine rechecked in 7-10 days. She is advised to return to the emergency department immediately if any changes, problems, or concerns.    Final diagnoses:  None    **I have reviewed nursing notes, vital signs, and all appropriate lab and imaging results for this patient.Lily Kocher, PA-C 03/17/15 1458  Orpah Greek, MD 03/18/15 [phone]

## 2015-03-17 NOTE — ED Notes (Signed)
Pt c/o chronic intermittent diarrhea since May, states she sees Dr. Dorien Chihuahua for GI. Pt states she is feeling weaker today from diarrhea. States stool for C-diff was negative last Sunday.

## 2015-03-17 NOTE — ED Provider Notes (Signed)
Patient presented to the ER with diarrhea. Patient has a history of chronic diarrhea, but recently had C. difficile colitis. She was treated with vancomycin. Patient had repeat C. difficile testing this past week that was negative.  Face to face Exam: HEENT - PERRLA Lungs - CTAB Heart - RRR, no M/R/G Abd - S/NT/ND Neuro - alert, oriented x3  Plan: Patient presents because of increasing diarrhea and feeling weak. She is concerned about the possibility of C. difficile colitis. Patient was recently treated and repeat antigen was negative. Does not require repeat testing. Will give IV fluids. Patient has follow-up with Dr. Laural Golden in two days.  Orpah Greek, MD 03/17/15 [phone]

## 2015-03-19 ENCOUNTER — Ambulatory Visit (INDEPENDENT_AMBULATORY_CARE_PROVIDER_SITE_OTHER): Payer: Medicare Other | Admitting: Internal Medicine

## 2015-03-19 ENCOUNTER — Other Ambulatory Visit (INDEPENDENT_AMBULATORY_CARE_PROVIDER_SITE_OTHER): Payer: Self-pay | Admitting: Internal Medicine

## 2015-03-19 ENCOUNTER — Encounter (INDEPENDENT_AMBULATORY_CARE_PROVIDER_SITE_OTHER): Payer: Self-pay | Admitting: Internal Medicine

## 2015-03-19 VITALS — BP 98/70 | HR 72 | Temp 97.6°F | Resp 18 | Ht 64.0 in | Wt 136.8 lb

## 2015-03-19 DIAGNOSIS — R8290 Unspecified abnormal findings in urine: Secondary | ICD-10-CM

## 2015-03-19 DIAGNOSIS — R110 Nausea: Secondary | ICD-10-CM

## 2015-03-19 DIAGNOSIS — R197 Diarrhea, unspecified: Secondary | ICD-10-CM

## 2015-03-19 LAB — URINE CULTURE: SPECIAL REQUESTS: NORMAL
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
## 2015-03-19 NOTE — Patient Instructions (Signed)
Can take Imodium OTC 2 mg by mouth 2 or 3 times a day. Physician will call with results of urinalysis and stool test

## 2015-03-20 DIAGNOSIS — R197 Diarrhea, unspecified: Secondary | ICD-10-CM | POA: Diagnosis not present

## 2015-03-20 DIAGNOSIS — R8290 Unspecified abnormal findings in urine: Secondary | ICD-10-CM | POA: Diagnosis not present

## 2015-03-20 LAB — URINALYSIS, ROUTINE W REFLEX MICROSCOPIC
Bilirubin Urine: NEGATIVE
Glucose, UA: NEGATIVE mg/dL
Hgb urine dipstick: NEGATIVE
KETONES UR: NEGATIVE mg/dL
Nitrite: NEGATIVE
Protein, ur: NEGATIVE mg/dL
Specific Gravity, Urine: 1.009 (ref 1.005–1.030)
Urobilinogen, UA: 0.2 mg/dL (ref 0.0–1.0)
pH: 6 (ref 5.0–8.0)

## 2015-03-20 LAB — URINALYSIS, MICROSCOPIC ONLY
CRYSTALS: NONE SEEN
Casts: NONE SEEN

## 2015-03-21 ENCOUNTER — Encounter (INDEPENDENT_AMBULATORY_CARE_PROVIDER_SITE_OTHER): Payer: Self-pay | Admitting: Internal Medicine

## 2015-03-21 LAB — CLOSTRIDIUM DIFFICILE BY PCR: CDIFFPCR: NOT DETECTED

## 2015-03-21 LAB — FECAL LACTOFERRIN, QUANT: Lactoferrin: NEGATIVE

## 2015-03-22 ENCOUNTER — Encounter (INDEPENDENT_AMBULATORY_CARE_PROVIDER_SITE_OTHER): Payer: Self-pay | Admitting: *Deleted

## 2015-03-22 ENCOUNTER — Telehealth (INDEPENDENT_AMBULATORY_CARE_PROVIDER_SITE_OTHER): Payer: Self-pay | Admitting: *Deleted

## 2015-03-22 DIAGNOSIS — E559 Vitamin D deficiency, unspecified: Secondary | ICD-10-CM | POA: Diagnosis not present

## 2015-03-22 DIAGNOSIS — K219 Gastro-esophageal reflux disease without esophagitis: Secondary | ICD-10-CM | POA: Diagnosis not present

## 2015-03-22 DIAGNOSIS — E785 Hyperlipidemia, unspecified: Secondary | ICD-10-CM | POA: Diagnosis not present

## 2015-03-22 DIAGNOSIS — K580 Irritable bowel syndrome with diarrhea: Secondary | ICD-10-CM | POA: Diagnosis not present

## 2015-03-22 DIAGNOSIS — R002 Palpitations: Secondary | ICD-10-CM | POA: Diagnosis not present

## 2015-03-22 DIAGNOSIS — H8110 Benign paroxysmal vertigo, unspecified ear: Secondary | ICD-10-CM | POA: Diagnosis not present

## 2015-03-22 DIAGNOSIS — Z0000 Encounter for general adult medical examination without abnormal findings: Secondary | ICD-10-CM | POA: Diagnosis not present

## 2015-03-22 NOTE — Telephone Encounter (Signed)
From: Melford Aase      Sent: 03/21/2015  4:21 PM      To: [facility]    Subject: Non-Urgent Medical Question                 This is in reference to your request for how much urine in a 24 hour time frame. I measured 40 oz's of urine from 5 pm to 12 noon today.        I also went without imodium for 24 hours and measured as best I could 3 1/2 to 3 3/4 cups of poop.        Hope this is helpful.        Rhonda Andrews 1941/08/04    [phone]     [phone]

## 2015-03-24 NOTE — Telephone Encounter (Signed)
Already addressed with patient. 

## 2015-03-29 ENCOUNTER — Encounter (INDEPENDENT_AMBULATORY_CARE_PROVIDER_SITE_OTHER): Payer: Self-pay | Admitting: Internal Medicine

## 2015-03-31 ENCOUNTER — Emergency Department (HOSPITAL_COMMUNITY)
Admission: EM | Admit: 2015-03-31 | Discharge: 2015-03-31 | Disposition: A | Discharge status: Home / Self Care | Payer: Medicare Other | Attending: Emergency Medicine | Admitting: Emergency Medicine

## 2015-03-31 ENCOUNTER — Encounter (HOSPITAL_COMMUNITY): Payer: Self-pay | Admitting: Emergency Medicine

## 2015-03-31 DIAGNOSIS — R202 Paresthesia of skin: Secondary | ICD-10-CM | POA: Diagnosis not present

## 2015-03-31 DIAGNOSIS — Z79899 Other long term (current) drug therapy: Secondary | ICD-10-CM | POA: Insufficient documentation

## 2015-03-31 DIAGNOSIS — Z8619 Personal history of other infectious and parasitic diseases: Secondary | ICD-10-CM | POA: Insufficient documentation

## 2015-03-31 DIAGNOSIS — R209 Unspecified disturbances of skin sensation: Secondary | ICD-10-CM | POA: Diagnosis not present

## 2015-03-31 DIAGNOSIS — R5383 Other fatigue: Secondary | ICD-10-CM | POA: Diagnosis present

## 2015-03-31 HISTORY — DX: Enterocolitis due to Clostridium difficile, not specified as recurrent: A04.72

## 2015-03-31 LAB — I-STAT CHEM 8, ED
BUN: 19 mg/dL (ref 6–20)
Calcium, Ion: 1.23 mmol/L (ref 1.13–1.30)
Chloride: 101 mmol/L (ref 101–111)
Creatinine, Ser: 1 mg/dL (ref 0.44–1.00)
Glucose, Bld: 122 mg/dL — ABNORMAL HIGH (ref 65–99)
HCT: 48 % — ABNORMAL HIGH (ref 36.0–46.0)
Hemoglobin: 16.3 g/dL — ABNORMAL HIGH (ref 12.0–15.0)
Potassium: 4 mmol/L (ref 3.5–5.1)
Sodium: 140 mmol/L (ref 135–145)
TCO2: 27 mmol/L (ref 0–100)

## 2015-03-31 NOTE — ED Notes (Signed)
Pt states that she has chronic diarrhea and that she feels her potassium is low again.  States having fatigue and weakness.

## 2015-03-31 NOTE — ED Provider Notes (Signed)
CSN: [id number]     Arrival date & time 03/31/15  1723 History   First MD Initiated Contact with Patient 03/31/15 1804     Chief Complaint  Patient presents with  . Fatigue     (Consider location/radiation/quality/duration/timing/severity/associated sxs/prior Treatment) HPI   Rhonda Andrews is a 74 y.o. female who presents for evaluation of a tingling sensation in both hands, which is intermittent, and started earlier today. She has had a similar sensation previously with her potassium was low. She denies headache, neck pain, back pain, focal weakness, nausea or vomiting. She has ongoing chronic diarrhea for which she is being managed by a gastroenterologist. She is taking her usual medications. There are no other known modifying factors.   Past Medical History  Diagnosis Date  . Heart rate fast   . 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 Clostridium difficile diarrhea 02/05/2015   Past Surgical History  Procedure Laterality Date  . Lactose intolerant    . Cholecystectomy  3 yrs ago  . Abdominal hysterectomy      ovaries remained  . Precancerous  tissue - nose  February 14-2014   History reviewed. No pertinent family history. History  Substance Use Topics  . Smoking status: Never Smoker   . Smokeless tobacco: Never Used  . Alcohol Use: No   OB History    No data available     Review of Systems  All other systems reviewed and are negative.     Allergies  Prednisone  Home Medications   Prior to Admission medications   Medication Sig Start Date End Date Taking? Authorizing Provider  Calcium Carbonate-Vitamin D (CALCIUM + D PO) Take 1 tablet by mouth daily with lunch. Calcium 1200 mg, vitamin D3 1000 units   Yes Historical Provider, MD  loperamide (IMODIUM A-D) 2 MG tablet Take 2 mg by mouth daily as needed for diarrhea or loose stools.   Yes Historical Provider, MD  ondansetron (ZOFRAN) 4 MG tablet Take 4 mg by mouth as needed for nausea or vomiting.  02/10/15  Yes Historical  Provider, MD  Probiotic Product (PROBIOTIC PO) Take 1 capsule by mouth daily.   Yes Historical Provider, MD  propranolol ER (INDERAL LA) 60 MG 24 hr capsule Take 1 capsule (60 mg total) by mouth daily. Patient taking differently: Take 30 mg by mouth daily with supper.  05/27/13  Yes Arnoldo Lenis, MD  Triamcinolone Acetonide (NASACORT AQ NA) Place 2 sprays into both nostrils daily as needed (dizziness from sinus).   Yes Historical Provider, MD  vancomycin (VANCOCIN) 125 MG capsule Take 1 capsule (125 mg total) by mouth 4 (four) times daily. Patient not taking: Reported on 03/17/2015 02/06/15   Butch Penny, NP   BP 152/89 mmHg  Pulse 85  Temp(Src) 98 F (36.7 C) (Oral)  Resp 18  Ht 5\' 4"  (1.626 m)  Wt 134 lb (60.782 kg)  BMI 22.99 kg/m2  SpO2 100% Physical Exam  Constitutional: She is oriented to person, place, and time. She appears well-developed and well-nourished.  HENT:  Head: Normocephalic and atraumatic.  Right Ear: External ear normal.  Left Ear: External ear normal.  Eyes: Conjunctivae and EOM are normal. Pupils are equal, round, and reactive to light.  Neck: Normal range of motion and phonation normal. Neck supple.  Cardiovascular: Normal rate, regular rhythm and normal heart sounds.   Pulmonary/Chest: Effort normal and breath sounds normal. She exhibits no bony tenderness.  Abdominal: Soft. There is no tenderness.  Musculoskeletal: Normal range of  motion.  Neurological: She is alert and oriented to person, place, and time. No cranial nerve deficit or sensory deficit. She exhibits normal muscle tone. Coordination normal.  No dysarthria and aphasia or nystagmus. No ataxia. Normal light touch sensation Face, hands and feet bilaterally.  Skin: Skin is warm, dry and intact.  Psychiatric: She has a normal mood and affect. Her behavior is normal. Judgment and thought content normal.  Nursing note and vitals reviewed.   ED Course  Procedures (including critical care  time) Medications - No data to display  Patient Vitals for the past 24 hrs:  BP Temp Temp src Pulse Resp SpO2 Height Weight  03/31/15 1733 152/89 mmHg 98 F (36.7 C) Oral 85 18 100 % 5\' 4"  (1.626 m) 134 lb (60.782 kg)    6:46 PM Reevaluation with update and discussion. After initial assessment and treatment, an updated evaluation reveals no additional complaints. Findings discussed with the patient, all questions were answered. [place] Review Labs Reviewed  I-STAT CHEM 8, ED - Abnormal; Notable for the following:    Glucose, Bld 122 (*)    Hemoglobin 16.3 (*)    HCT 48.0 (*)    All other components within normal limits    Imaging Review No results found.   EKG Interpretation None      MDM   Final diagnoses:  Paresthesia    Non-constant tingling hands with normal neurologic exam. No evidence for CVA, metabolic instability, or suspected serious bacterial infection.  Nursing Notes Reviewed/ Care Coordinated Applicable Imaging Reviewed Interpretation of Laboratory Data incorporated into ED treatment  The patient appears reasonably screened and/or stabilized for discharge and I doubt any other medical condition or other [facility] requiring further screening, evaluation, or treatment in the ED at this time prior to discharge.  Plan: Home Medications- usual; Home Treatments- rest, fluids; return here if the recommended treatment, does not improve the symptoms; Recommended follow up- PCP prn    Daleen Bo, MD 03/31/15 [phone]

## 2015-03-31 NOTE — Discharge Instructions (Signed)

## 2015-04-10 DIAGNOSIS — L570 Actinic keratosis: Secondary | ICD-10-CM | POA: Diagnosis not present

## 2015-04-10 DIAGNOSIS — Z85828 Personal history of other malignant neoplasm of skin: Secondary | ICD-10-CM | POA: Diagnosis not present

## 2015-04-11 ENCOUNTER — Encounter (INDEPENDENT_AMBULATORY_CARE_PROVIDER_SITE_OTHER): Payer: Self-pay | Admitting: Internal Medicine

## 2015-04-11 ENCOUNTER — Ambulatory Visit (INDEPENDENT_AMBULATORY_CARE_PROVIDER_SITE_OTHER): Payer: Medicare Other | Admitting: Internal Medicine

## 2015-04-11 VITALS — BP 112/60 | HR 64 | Temp 97.5°F | Ht 64.0 in | Wt 137.0 lb

## 2015-04-11 DIAGNOSIS — A0472 Enterocolitis due to Clostridium difficile, not specified as recurrent: Secondary | ICD-10-CM

## 2015-04-11 DIAGNOSIS — A047 Enterocolitis due to Clostridium difficile: Secondary | ICD-10-CM | POA: Diagnosis not present

## 2015-04-11 NOTE — Patient Instructions (Signed)
OV in 6 months. 
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
## 2015-04-16 ENCOUNTER — Ambulatory Visit (INDEPENDENT_AMBULATORY_CARE_PROVIDER_SITE_OTHER): Payer: Medicare Other | Admitting: Internal Medicine

## 2015-04-19 DIAGNOSIS — M47812 Spondylosis without myelopathy or radiculopathy, cervical region: Secondary | ICD-10-CM | POA: Diagnosis not present

## 2015-04-19 DIAGNOSIS — M47816 Spondylosis without myelopathy or radiculopathy, lumbar region: Secondary | ICD-10-CM | POA: Diagnosis not present

## 2015-04-19 DIAGNOSIS — M5442 Lumbago with sciatica, left side: Secondary | ICD-10-CM | POA: Diagnosis not present

## 2015-04-19 DIAGNOSIS — M546 Pain in thoracic spine: Secondary | ICD-10-CM | POA: Diagnosis not present

## 2015-04-19 DIAGNOSIS — M9901 Segmental and somatic dysfunction of cervical region: Secondary | ICD-10-CM | POA: Diagnosis not present

## 2015-04-19 DIAGNOSIS — M9903 Segmental and somatic dysfunction of lumbar region: Secondary | ICD-10-CM | POA: Diagnosis not present

## 2015-04-19 DIAGNOSIS — M9902 Segmental and somatic dysfunction of thoracic region: Secondary | ICD-10-CM | POA: Diagnosis not present

## 2015-04-23 DIAGNOSIS — M9901 Segmental and somatic dysfunction of cervical region: Secondary | ICD-10-CM | POA: Diagnosis not present

## 2015-04-23 DIAGNOSIS — M546 Pain in thoracic spine: Secondary | ICD-10-CM | POA: Diagnosis not present

## 2015-04-23 DIAGNOSIS — M47812 Spondylosis without myelopathy or radiculopathy, cervical region: Secondary | ICD-10-CM | POA: Diagnosis not present

## 2015-04-23 DIAGNOSIS — M9902 Segmental and somatic dysfunction of thoracic region: Secondary | ICD-10-CM | POA: Diagnosis not present

## 2015-04-23 DIAGNOSIS — M5442 Lumbago with sciatica, left side: Secondary | ICD-10-CM | POA: Diagnosis not present

## 2015-04-23 DIAGNOSIS — M9903 Segmental and somatic dysfunction of lumbar region: Secondary | ICD-10-CM | POA: Diagnosis not present

## 2015-04-23 DIAGNOSIS — M47816 Spondylosis without myelopathy or radiculopathy, lumbar region: Secondary | ICD-10-CM | POA: Diagnosis not present

## 2015-04-26 DIAGNOSIS — M9902 Segmental and somatic dysfunction of thoracic region: Secondary | ICD-10-CM | POA: Diagnosis not present

## 2015-04-26 DIAGNOSIS — M9901 Segmental and somatic dysfunction of cervical region: Secondary | ICD-10-CM | POA: Diagnosis not present

## 2015-04-26 DIAGNOSIS — M546 Pain in thoracic spine: Secondary | ICD-10-CM | POA: Diagnosis not present

## 2015-04-26 DIAGNOSIS — M5442 Lumbago with sciatica, left side: Secondary | ICD-10-CM | POA: Diagnosis not present

## 2015-04-26 DIAGNOSIS — M47816 Spondylosis without myelopathy or radiculopathy, lumbar region: Secondary | ICD-10-CM | POA: Diagnosis not present

## 2015-04-26 DIAGNOSIS — G441 Vascular headache, not elsewhere classified: Secondary | ICD-10-CM | POA: Diagnosis not present

## 2015-04-26 DIAGNOSIS — M47812 Spondylosis without myelopathy or radiculopathy, cervical region: Secondary | ICD-10-CM | POA: Diagnosis not present

## 2015-04-26 DIAGNOSIS — M9903 Segmental and somatic dysfunction of lumbar region: Secondary | ICD-10-CM | POA: Diagnosis not present

## 2015-04-30 ENCOUNTER — Other Ambulatory Visit (INDEPENDENT_AMBULATORY_CARE_PROVIDER_SITE_OTHER): Payer: Self-pay | Admitting: Internal Medicine

## 2015-05-01 ENCOUNTER — Telehealth (INDEPENDENT_AMBULATORY_CARE_PROVIDER_SITE_OTHER): Payer: Self-pay | Admitting: *Deleted

## 2015-05-01 ENCOUNTER — Other Ambulatory Visit (INDEPENDENT_AMBULATORY_CARE_PROVIDER_SITE_OTHER): Payer: Self-pay | Admitting: *Deleted

## 2015-05-01 ENCOUNTER — Other Ambulatory Visit (INDEPENDENT_AMBULATORY_CARE_PROVIDER_SITE_OTHER): Payer: Self-pay | Admitting: Internal Medicine

## 2015-05-01 DIAGNOSIS — R197 Diarrhea, unspecified: Secondary | ICD-10-CM

## 2015-05-01 MED ORDER — CHOLESTYRAMINE 4 G PO PACK: 4.0000 g | PACK | Freq: Two times a day (BID) | ORAL | Status: DC

## 2015-05-01 NOTE — Telephone Encounter (Signed)
Per Dr.Rehman the patient will need to have stool culture, C-Diff, take the Flagyl as directed. Questran 4 grams is to be taken 2 hours before a meal , and second dose 2 hours after meal.

## 2015-05-01 NOTE — Telephone Encounter (Signed)
Per Dr.Rehman - patient needs to have culture on stool , and refill Flagyl for 2 weeks.

## 2015-05-02 ENCOUNTER — Telehealth (INDEPENDENT_AMBULATORY_CARE_PROVIDER_SITE_OTHER): Payer: Self-pay | Admitting: Internal Medicine

## 2015-05-02 ENCOUNTER — Other Ambulatory Visit (INDEPENDENT_AMBULATORY_CARE_PROVIDER_SITE_OTHER): Payer: Self-pay | Admitting: Internal Medicine

## 2015-05-02 MED ORDER — METRONIDAZOLE 250 MG PO TABS: 250.0000 mg | ORAL_TABLET | Freq: Three times a day (TID) | ORAL | Status: DC

## 2015-05-02 NOTE — Telephone Encounter (Signed)
I talked with Rhonda Andrews. She cannot take the Flagyl 500mg . I am going to order Flagyl 250mg  tid x 10 days.  She will stop the 500mg 

## 2015-05-03 LAB — C. DIFFICILE GDH AND TOXIN A/B

## 2015-05-05 LAB — STOOL CULTURE

## 2015-05-22 ENCOUNTER — Encounter (INDEPENDENT_AMBULATORY_CARE_PROVIDER_SITE_OTHER): Payer: Self-pay | Admitting: Internal Medicine

## 2015-06-04 ENCOUNTER — Ambulatory Visit (INDEPENDENT_AMBULATORY_CARE_PROVIDER_SITE_OTHER): Payer: Medicare Other | Admitting: Internal Medicine

## 2015-06-04 ENCOUNTER — Encounter (INDEPENDENT_AMBULATORY_CARE_PROVIDER_SITE_OTHER): Payer: Self-pay | Admitting: Internal Medicine

## 2015-06-04 VITALS — BP 102/68 | HR 66 | Temp 97.4°F | Resp 18 | Ht 64.0 in | Wt 135.4 lb

## 2015-06-04 DIAGNOSIS — Z8719 Personal history of other diseases of the digestive system: Secondary | ICD-10-CM

## 2015-06-04 DIAGNOSIS — Z8619 Personal history of other infectious and parasitic diseases: Secondary | ICD-10-CM

## 2015-06-04 NOTE — Patient Instructions (Addendum)
Call if diarrhea relapses. Continue probiotic for another 2 months.

## 2015-06-04 NOTE — Progress Notes (Signed)
Presenting complaint;  Follow-up for diarrhea and C. difficile colitis.  Subjective:  Patient is 74 year old Caucasian female was history of small bowel bacterial overgrowth and was evaluated for diarrhea back in June 2016 and C. difficile by PCR was positive. Patient was treated with vancomycin for 10 days. C. difficile by PCR was negative in July this year. Her diarrhea relapsed about 5 weeks ago but stool studies were negative. She was advised use Imodium on as-needed basis and she was also treated with Questran. She has not taken Questran in 3 weeks. She is using Imodium on as-needed basis. She's been having formed stool daily for the last 4 weeks. On the time she is experienced diarrhea as after eating strawberry shortcake. She has good appetite. She denies nausea vomiting abdominal pain melena or rectal bleeding. She has intermittent episodes of burping. This is not associated with heartburn. She is only lost 2 pounds in the last 3 months. She stays active and walks about a mile every day.   Current Medications: Outpatient Encounter Prescriptions as of 06/04/2015  Medication Sig  . Calcium Carbonate-Vitamin D (CALCIUM + D PO) Take 1 tablet by mouth daily with lunch. Calcium 1200 mg, vitamin D3 1000 units  . loperamide (IMODIUM A-D) 2 MG tablet Take 2 mg by mouth daily as needed for diarrhea or loose stools.  . ondansetron (ZOFRAN) 4 MG tablet Take 4 mg by mouth as needed for nausea or vomiting.   . Probiotic Product (PROBIOTIC PO) Take 1 capsule by mouth daily.  . propranolol ER (INDERAL LA) 60 MG 24 hr capsule Take 1 capsule (60 mg total) by mouth daily. (Patient taking differently: Take 30 mg by mouth daily with supper. )  . Triamcinolone Acetonide (NASACORT AQ NA) Place 2 sprays into both nostrils daily as needed (dizziness from sinus).  . cholestyramine (QUESTRAN) 4 G packet Take 1 packet (4 g total) by mouth 2 (two) times daily. Take 2 hours before a meal , then second dose 2 hours after  meal. (Patient not taking: Reported on 06/04/2015)  . [DISCONTINUED] metroNIDAZOLE (FLAGYL) 250 MG tablet Take 1 tablet (250 mg total) by mouth 3 (three) times daily. (Patient not taking: Reported on 06/04/2015)  . [DISCONTINUED] metroNIDAZOLE (FLAGYL) 500 MG tablet TAKE (1) TABLET TWICE DAILY. (Patient not taking: Reported on 06/04/2015)  . [DISCONTINUED] vancomycin (VANCOCIN) 125 MG capsule Take 1 capsule (125 mg total) by mouth 4 (four) times daily. (Patient not taking: Reported on 03/17/2015)   No facility-administered encounter medications on file as of 06/04/2015.     Objective: Blood pressure 102/68, pulse 66, temperature 97.4 F (36.3 C), temperature source Oral, resp. rate 18, height 5\' 4"  (1.626 m), weight 135 lb 6.4 oz (61.417 kg). Patient is alert and in no acute distress. Conjunctiva is pink. Sclera is nonicteric Oropharyngeal mucosa is normal. No neck masses or thyromegaly noted. Cardiac exam with regular rhythm normal S1 and S2. No murmur or gallop noted. Lungs are clear to auscultation. Abdomen is symmetrical soft and nontender without organomegaly or masses. No LE edema or clubbing noted.   Assessment:  #1. History of C. difficile colitis. She's been having formed stools for about a month. She has not been on anti-biotic for more than a month. Risk of relapse with every day that goes by decreases. #2. History of small bowel bacterial overgrowth. Presently without symptoms to suggest relapse.   Plan:  Patient advised to continue probiotic for 2-3 months. Patient will call if diarrhea recurs. Office visit in 52  months.

## 2015-06-18 ENCOUNTER — Other Ambulatory Visit (INDEPENDENT_AMBULATORY_CARE_PROVIDER_SITE_OTHER): Payer: Self-pay | Admitting: Internal Medicine

## 2015-06-18 ENCOUNTER — Encounter (INDEPENDENT_AMBULATORY_CARE_PROVIDER_SITE_OTHER): Payer: Self-pay | Admitting: Internal Medicine

## 2015-06-18 MED ORDER — HYOSCYAMINE SULFATE 0.125 MG SL SUBL: 0.1250 mg | SUBLINGUAL_TABLET | Freq: Three times a day (TID) | SUBLINGUAL | Status: DC

## 2015-06-19 ENCOUNTER — Telehealth (INDEPENDENT_AMBULATORY_CARE_PROVIDER_SITE_OTHER): Payer: Self-pay | Admitting: *Deleted

## 2015-06-19 DIAGNOSIS — A0472 Enterocolitis due to Clostridium difficile, not specified as recurrent: Secondary | ICD-10-CM

## 2015-06-19 DIAGNOSIS — A047 Enterocolitis due to Clostridium difficile: Secondary | ICD-10-CM | POA: Diagnosis not present

## 2015-06-19 NOTE — Telephone Encounter (Signed)
Per Dr.Rehman the patient will need to have labs drawn. 

## 2015-06-20 LAB — COMPREHENSIVE METABOLIC PANEL
ALT: 10 U/L (ref 6–29)
AST: 16 U/L (ref 10–35)
Albumin: 4 g/dL (ref 3.6–5.1)
Alkaline Phosphatase: 65 U/L (ref 33–130)
BILIRUBIN TOTAL: 0.6 mg/dL (ref 0.2–1.2)
BUN: 22 mg/dL (ref 7–25)
CALCIUM: 9.1 mg/dL (ref 8.6–10.4)
CO2: 30 mmol/L (ref 20–31)
Chloride: 102 mmol/L (ref 98–110)
Creat: 0.77 mg/dL (ref 0.60–0.93)
GLUCOSE: 115 mg/dL — AB (ref 65–99)
Potassium: 4.5 mmol/L (ref 3.5–5.3)
Sodium: 139 mmol/L (ref 135–146)
Total Protein: 6.3 g/dL (ref 6.1–8.1)

## 2015-06-20 LAB — CBC
HCT: 44.3 % (ref 36.0–46.0)
Hemoglobin: 15.1 g/dL — ABNORMAL HIGH (ref 12.0–15.0)
MCH: 30.5 pg (ref 26.0–34.0)
MCHC: 34.1 g/dL (ref 30.0–36.0)
MCV: 89.5 fL (ref 78.0–100.0)
MPV: 9.3 fL (ref 8.6–12.4)
PLATELETS: 244 10*3/uL (ref 150–400)
RBC: 4.95 MIL/uL (ref 3.87–5.11)
RDW: 13.5 % (ref 11.5–15.5)
WBC: 7.4 10*3/uL (ref 4.0–10.5)

## 2015-06-20 LAB — SEDIMENTATION RATE: Sed Rate: 1 mm/hr (ref 0–30)

## 2015-07-30 DIAGNOSIS — L570 Actinic keratosis: Secondary | ICD-10-CM | POA: Diagnosis not present

## 2015-07-30 DIAGNOSIS — Z85828 Personal history of other malignant neoplasm of skin: Secondary | ICD-10-CM | POA: Diagnosis not present

## 2015-09-24 ENCOUNTER — Encounter (INDEPENDENT_AMBULATORY_CARE_PROVIDER_SITE_OTHER): Payer: Self-pay | Admitting: Internal Medicine

## 2015-09-24 ENCOUNTER — Ambulatory Visit (INDEPENDENT_AMBULATORY_CARE_PROVIDER_SITE_OTHER): Payer: Medicare Other | Admitting: Internal Medicine

## 2015-09-24 VITALS — BP 122/64 | HR 76 | Temp 97.3°F | Ht 64.0 in | Wt 138.0 lb

## 2015-09-24 DIAGNOSIS — A047 Enterocolitis due to Clostridium difficile: Secondary | ICD-10-CM

## 2015-09-24 DIAGNOSIS — A0472 Enterocolitis due to Clostridium difficile, not specified as recurrent: Secondary | ICD-10-CM

## 2015-09-24 NOTE — Patient Instructions (Signed)
OV in 1 year.  

## 2015-09-24 NOTE — Progress Notes (Signed)
   Subjective:    Patient ID: Rhonda Andrews, female    DOB: [birth year], 75 y.o.   MRN: [id number]  HPI Here today for f/u. She was last seen in October of 2016 by Dr. Laural Golden. She has a hx of C diff. Her GI pathogen 02/22/2015 was positive for C-diff. She was started on Vancomycin 125mg  QID x 10 days.  C. Diff 03/19/2015 was negative. She tells me she is doing good. Her stools are normal. Stools are formed. She is having BM x 2 a day. No melena or BRRB. Appetite is good. She has gained 3 pounds. She feels good.   Her last colonoscopy was in 2010 by Dr. Laural Golden which revealed left sided diverticulosis, normal terminal ileum, small external hemorrhoids.        Review of Systems Past Medical History  Diagnosis Date  . Heart rate fast   . 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 Clostridium difficile diarrhea 02/05/2015    Past Surgical History  Procedure Laterality Date  . Lactose intolerant    . Cholecystectomy  3 yrs ago  . Abdominal hysterectomy      ovaries remained  . Precancerous  tissue - nose  February 14-2014    Allergies  Allergen Reactions  . Prednisone Other (See Comments)    Heart races    Current Outpatient Prescriptions on File Prior to Visit  Medication Sig Dispense Refill  . Calcium Carbonate-Vitamin D (CALCIUM + D PO) Take 1 tablet by mouth daily with lunch. Calcium 1200 mg, vitamin D3 1000 units    . propranolol ER (INDERAL LA) 60 MG 24 hr capsule Take 1 capsule (60 mg total) by mouth daily. (Patient taking differently: Take 30 mg by mouth daily with supper. ) 30 capsule 6  . Triamcinolone Acetonide (NASACORT AQ NA) Place 2 sprays into both nostrils daily as needed (dizziness from sinus).     No current facility-administered medications on file prior to visit.        Objective:   Physical Exam Blood pressure 122/64, pulse 76, temperature 97.3 F (36.3 C), height 5\' 4"  (1.626 m), weight 138 lb (62.596 kg).  Alert and oriented. Skin warm and dry. Oral mucosa is moist.    . Sclera anicteric, conjunctivae is pink. Thyroid not enlarged. No cervical lymphadenopathy. Lungs clear. Heart regular rate and rhythm.  Abdomen is soft. Bowel sounds are positive. No hepatomegaly. No abdominal masses felt. No tenderness.  No edema to lower extremities. Patient is alert and oriented.       Assessment & Plan:  GERD. No problems. She is eating what she wants. She avoid spicy foods. OV in 1 year.

## 2015-10-17 DIAGNOSIS — M546 Pain in thoracic spine: Secondary | ICD-10-CM | POA: Diagnosis not present

## 2015-10-17 DIAGNOSIS — M9901 Segmental and somatic dysfunction of cervical region: Secondary | ICD-10-CM | POA: Diagnosis not present

## 2015-10-17 DIAGNOSIS — M5442 Lumbago with sciatica, left side: Secondary | ICD-10-CM | POA: Diagnosis not present

## 2015-10-17 DIAGNOSIS — M9903 Segmental and somatic dysfunction of lumbar region: Secondary | ICD-10-CM | POA: Diagnosis not present

## 2015-10-17 DIAGNOSIS — M47816 Spondylosis without myelopathy or radiculopathy, lumbar region: Secondary | ICD-10-CM | POA: Diagnosis not present

## 2015-10-17 DIAGNOSIS — M47812 Spondylosis without myelopathy or radiculopathy, cervical region: Secondary | ICD-10-CM | POA: Diagnosis not present

## 2015-10-17 DIAGNOSIS — G441 Vascular headache, not elsewhere classified: Secondary | ICD-10-CM | POA: Diagnosis not present

## 2015-10-17 DIAGNOSIS — M9902 Segmental and somatic dysfunction of thoracic region: Secondary | ICD-10-CM | POA: Diagnosis not present

## 2015-10-18 DIAGNOSIS — M9901 Segmental and somatic dysfunction of cervical region: Secondary | ICD-10-CM | POA: Diagnosis not present

## 2015-10-18 DIAGNOSIS — G441 Vascular headache, not elsewhere classified: Secondary | ICD-10-CM | POA: Diagnosis not present

## 2015-10-18 DIAGNOSIS — M9903 Segmental and somatic dysfunction of lumbar region: Secondary | ICD-10-CM | POA: Diagnosis not present

## 2015-10-18 DIAGNOSIS — M5442 Lumbago with sciatica, left side: Secondary | ICD-10-CM | POA: Diagnosis not present

## 2015-10-18 DIAGNOSIS — M47816 Spondylosis without myelopathy or radiculopathy, lumbar region: Secondary | ICD-10-CM | POA: Diagnosis not present

## 2015-10-18 DIAGNOSIS — M47812 Spondylosis without myelopathy or radiculopathy, cervical region: Secondary | ICD-10-CM | POA: Diagnosis not present

## 2015-10-18 DIAGNOSIS — M546 Pain in thoracic spine: Secondary | ICD-10-CM | POA: Diagnosis not present

## 2015-10-18 DIAGNOSIS — M9902 Segmental and somatic dysfunction of thoracic region: Secondary | ICD-10-CM | POA: Diagnosis not present

## 2015-10-22 DIAGNOSIS — M47812 Spondylosis without myelopathy or radiculopathy, cervical region: Secondary | ICD-10-CM | POA: Diagnosis not present

## 2015-10-22 DIAGNOSIS — M9903 Segmental and somatic dysfunction of lumbar region: Secondary | ICD-10-CM | POA: Diagnosis not present

## 2015-10-22 DIAGNOSIS — G441 Vascular headache, not elsewhere classified: Secondary | ICD-10-CM | POA: Diagnosis not present

## 2015-10-22 DIAGNOSIS — M546 Pain in thoracic spine: Secondary | ICD-10-CM | POA: Diagnosis not present

## 2015-10-22 DIAGNOSIS — M9901 Segmental and somatic dysfunction of cervical region: Secondary | ICD-10-CM | POA: Diagnosis not present

## 2015-10-22 DIAGNOSIS — S338XXA Sprain of other parts of lumbar spine and pelvis, initial encounter: Secondary | ICD-10-CM | POA: Diagnosis not present

## 2015-10-22 DIAGNOSIS — M47816 Spondylosis without myelopathy or radiculopathy, lumbar region: Secondary | ICD-10-CM | POA: Diagnosis not present

## 2015-10-22 DIAGNOSIS — M9902 Segmental and somatic dysfunction of thoracic region: Secondary | ICD-10-CM | POA: Diagnosis not present

## 2015-10-22 DIAGNOSIS — M5442 Lumbago with sciatica, left side: Secondary | ICD-10-CM | POA: Diagnosis not present

## 2015-10-31 DIAGNOSIS — L570 Actinic keratosis: Secondary | ICD-10-CM | POA: Diagnosis not present

## 2015-10-31 DIAGNOSIS — Z85828 Personal history of other malignant neoplasm of skin: Secondary | ICD-10-CM | POA: Diagnosis not present

## 2015-11-14 DIAGNOSIS — M546 Pain in thoracic spine: Secondary | ICD-10-CM | POA: Diagnosis not present

## 2015-11-14 DIAGNOSIS — S338XXA Sprain of other parts of lumbar spine and pelvis, initial encounter: Secondary | ICD-10-CM | POA: Diagnosis not present

## 2015-11-14 DIAGNOSIS — M9903 Segmental and somatic dysfunction of lumbar region: Secondary | ICD-10-CM | POA: Diagnosis not present

## 2015-11-14 DIAGNOSIS — G441 Vascular headache, not elsewhere classified: Secondary | ICD-10-CM | POA: Diagnosis not present

## 2015-11-14 DIAGNOSIS — M5442 Lumbago with sciatica, left side: Secondary | ICD-10-CM | POA: Diagnosis not present

## 2015-11-14 DIAGNOSIS — M9902 Segmental and somatic dysfunction of thoracic region: Secondary | ICD-10-CM | POA: Diagnosis not present

## 2015-11-14 DIAGNOSIS — M47816 Spondylosis without myelopathy or radiculopathy, lumbar region: Secondary | ICD-10-CM | POA: Diagnosis not present

## 2015-11-14 DIAGNOSIS — M9901 Segmental and somatic dysfunction of cervical region: Secondary | ICD-10-CM | POA: Diagnosis not present

## 2015-11-14 DIAGNOSIS — M47812 Spondylosis without myelopathy or radiculopathy, cervical region: Secondary | ICD-10-CM | POA: Diagnosis not present

## 2015-12-03 ENCOUNTER — Encounter (INDEPENDENT_AMBULATORY_CARE_PROVIDER_SITE_OTHER): Payer: Self-pay | Admitting: Internal Medicine

## 2015-12-03 ENCOUNTER — Ambulatory Visit (INDEPENDENT_AMBULATORY_CARE_PROVIDER_SITE_OTHER): Payer: Medicare Other | Admitting: Internal Medicine

## 2015-12-03 VITALS — BP 100/60 | HR 64 | Temp 97.4°F | Ht 64.0 in | Wt 136.3 lb

## 2015-12-03 DIAGNOSIS — A047 Enterocolitis due to Clostridium difficile: Secondary | ICD-10-CM | POA: Diagnosis not present

## 2015-12-03 DIAGNOSIS — A0472 Enterocolitis due to Clostridium difficile, not specified as recurrent: Secondary | ICD-10-CM

## 2015-12-03 NOTE — Patient Instructions (Signed)
OV in 6 months. 

## 2015-12-03 NOTE — Progress Notes (Signed)
   Subjective:    Patient ID: Rhonda Andrews, female    DOB: February 23, 1941, 75 y.o.   MRN: [id number]  HPI Here today for f/u. She was last seen in October by Dr. Laural Golden.Hx of C-diff.  Treated with Vancomycin x 10 days in June. . C diff was negative in July.  She tells she is doing good. She says she ate some spaghetti and strawberry cobbler which gave her diarrhea 5 days ago. Today she says she is doing good. She is having one stool a day. No melena or BRRB.  Her appetite is good. No weight loss. She denies bloating. No acid reflux.  She walks daily.    Review of Systems Past Medical History  Diagnosis Date  . Heart rate fast   . 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 Clostridium difficile diarrhea 02/05/2015    Past Surgical History  Procedure Laterality Date  . Lactose intolerant    . Cholecystectomy  3 yrs ago  . Abdominal hysterectomy      ovaries remained  . Precancerous  tissue - nose  February 14-2014    Allergies  Allergen Reactions  . Prednisone Other (See Comments)    Heart races    Current Outpatient Prescriptions on File Prior to Visit  Medication Sig Dispense Refill  . Calcium Carbonate-Vitamin D (CALCIUM + D PO) Take 1 tablet by mouth daily with lunch. Calcium 1200 mg, vitamin D3 1000 units    . cholecalciferol (VITAMIN D) 1000 units tablet Take 1,000 Units by mouth daily.    . propranolol ER (INDERAL LA) 60 MG 24 hr capsule Take 1 capsule (60 mg total) by mouth daily. (Patient taking differently: Take 30 mg by mouth daily with supper. ) 30 capsule 6  . Triamcinolone Acetonide (NASACORT AQ NA) Place 2 sprays into both nostrils daily as needed (dizziness from sinus).     No current facility-administered medications on file prior to visit.        Objective:   Physical Exam Blood pressure 100/60, pulse 64, temperature 97.4 F (36.3 C), height 5\' 4"  (1.626 m), weight 136 lb 4.8 oz (61.825 kg). Alert and oriented. Skin warm and dry. Oral mucosa is moist.   . Sclera anicteric,  conjunctivae is pink. Thyroid not enlarged. No cervical lymphadenopathy. Lungs clear. Heart regular rate and rhythm.  Abdomen is soft. Bowel sounds are positive. No hepatomegaly. No abdominal masses felt. No tenderness.  No edema to lower extremities.          Assessment & Plan:     History of C. difficile colitis  No symptoms to suggest relapse. She is having one stool a day.    History of small bowel bacterial overgrowth. Presently without symptoms to suggest bacterial overgrowth.  Plan: OV in 6 months.

## 2016-01-02 DIAGNOSIS — L570 Actinic keratosis: Secondary | ICD-10-CM | POA: Diagnosis not present

## 2016-01-02 DIAGNOSIS — Z85828 Personal history of other malignant neoplasm of skin: Secondary | ICD-10-CM | POA: Diagnosis not present

## 2016-01-14 ENCOUNTER — Encounter (INDEPENDENT_AMBULATORY_CARE_PROVIDER_SITE_OTHER): Payer: Self-pay | Admitting: Internal Medicine

## 2016-01-16 ENCOUNTER — Telehealth (INDEPENDENT_AMBULATORY_CARE_PROVIDER_SITE_OTHER): Payer: Self-pay | Admitting: Internal Medicine

## 2016-01-16 DIAGNOSIS — R197 Diarrhea, unspecified: Secondary | ICD-10-CM

## 2016-01-16 NOTE — Telephone Encounter (Signed)
Will get a GI pathogen.  

## 2016-01-22 DIAGNOSIS — H43393 Other vitreous opacities, bilateral: Secondary | ICD-10-CM | POA: Diagnosis not present

## 2016-01-25 ENCOUNTER — Encounter (INDEPENDENT_AMBULATORY_CARE_PROVIDER_SITE_OTHER): Payer: Self-pay | Admitting: Internal Medicine

## 2016-01-29 ENCOUNTER — Telehealth (INDEPENDENT_AMBULATORY_CARE_PROVIDER_SITE_OTHER): Payer: Self-pay | Admitting: Internal Medicine

## 2016-01-29 NOTE — Telephone Encounter (Signed)
Spoke with patient this morning. I corrected what I had told her earlier. Lab did not run first stool specimen due to it being formed. Her second specimen this morning was also formed and I informed her lab would not run this one either.

## 2016-01-31 ENCOUNTER — Encounter (INDEPENDENT_AMBULATORY_CARE_PROVIDER_SITE_OTHER): Payer: Self-pay | Admitting: Internal Medicine

## 2016-04-08 DIAGNOSIS — L570 Actinic keratosis: Secondary | ICD-10-CM | POA: Diagnosis not present

## 2016-04-08 DIAGNOSIS — Z85828 Personal history of other malignant neoplasm of skin: Secondary | ICD-10-CM | POA: Diagnosis not present

## 2016-04-30 ENCOUNTER — Encounter (INDEPENDENT_AMBULATORY_CARE_PROVIDER_SITE_OTHER): Payer: Self-pay | Admitting: Internal Medicine

## 2016-05-25 ENCOUNTER — Telehealth (INDEPENDENT_AMBULATORY_CARE_PROVIDER_SITE_OTHER): Payer: Self-pay | Admitting: Internal Medicine

## 2016-05-25 NOTE — Telephone Encounter (Signed)
Patient called stating the diarrhea was not any better with Imodium. Patient denies rectal bleeding or fever. Patient advised to take Imodium OTC 2 mg before each meal. She will begin metronidazole 250 mg 3 times a day. She has 6 doses. Will send new prescription to her pharmacy. Patient will stop by the office tomorrow so that we can do stool studies for C. difficile.

## 2016-05-26 ENCOUNTER — Other Ambulatory Visit (INDEPENDENT_AMBULATORY_CARE_PROVIDER_SITE_OTHER): Payer: Self-pay | Admitting: *Deleted

## 2016-05-26 DIAGNOSIS — R197 Diarrhea, unspecified: Secondary | ICD-10-CM

## 2016-05-26 NOTE — Telephone Encounter (Signed)
Order has been completed for the C-Diff. Will check with the Pharmacy about Metronidazole, to make sure they have the Rx.

## 2016-05-28 ENCOUNTER — Other Ambulatory Visit (INDEPENDENT_AMBULATORY_CARE_PROVIDER_SITE_OTHER): Payer: Self-pay | Admitting: Internal Medicine

## 2016-05-28 DIAGNOSIS — R197 Diarrhea, unspecified: Secondary | ICD-10-CM | POA: Diagnosis not present

## 2016-05-29 LAB — C. DIFFICILE GDH AND TOXIN A/B
C. DIFF TOXIN A/B: NOT DETECTED
C. difficile GDH: NOT DETECTED

## 2016-06-04 ENCOUNTER — Encounter (INDEPENDENT_AMBULATORY_CARE_PROVIDER_SITE_OTHER): Payer: Self-pay | Admitting: Internal Medicine

## 2016-06-04 ENCOUNTER — Ambulatory Visit (INDEPENDENT_AMBULATORY_CARE_PROVIDER_SITE_OTHER): Payer: Medicare Other | Admitting: Internal Medicine

## 2016-06-04 VITALS — BP 102/68 | HR 64 | Temp 97.7°F | Ht 64.0 in | Wt 132.3 lb

## 2016-06-04 DIAGNOSIS — R197 Diarrhea, unspecified: Secondary | ICD-10-CM

## 2016-06-04 NOTE — Patient Instructions (Addendum)
OV in 1 year. Continue the Imodium 1/2 tab daily.

## 2016-06-04 NOTE — Progress Notes (Signed)
   Subjective:    Patient ID: Rhonda Andrews, female    DOB: 06-15-1941, 75 y.o.   MRN: [id number]  HPI Here today for f/u. She was empirically treated for C-diff by Dr. Laural Golden 05/25/2016. She was treated with Flagyl 250mg  tid. She took the Flagyl x 3-4 days.  GI pathogen was negative. Flagyl stopped and she was advised to take Imodium  1/2 tab TID.  She tells me her stools are formed. She says she will go 2-3 days and stools are formed. Then she will go 2-3 days and not have a BM.  She is taking a Probiotic.     Review of Systems Past Medical History:  Diagnosis Date  . Clostridium difficile diarrhea 02/05/2015  . 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 Heart rate fast     Past Surgical History:  Procedure Laterality Date  . ABDOMINAL HYSTERECTOMY     ovaries remained  . CHOLECYSTECTOMY  3 yrs ago  . lactose intolerant    . Precancerous  Tissue - Nose  February 14-2014    Allergies  Allergen Reactions  . Prednisone Other (See Comments)    Heart races    Current Outpatient Prescriptions on File Prior to Visit  Medication Sig Dispense Refill  . Calcium Carbonate-Vitamin D (CALCIUM + D PO) Take 1 tablet by mouth daily with lunch. Calcium 1200 mg, vitamin D3 1000 units    . cholecalciferol (VITAMIN D) 1000 units tablet Take 1,000 Units by mouth daily.    . propranolol ER (INDERAL LA) 60 MG 24 hr capsule Take 1 capsule (60 mg total) by mouth daily. (Patient taking differently: Take 30 mg by mouth daily with supper. ) 30 capsule 6  . Triamcinolone Acetonide (NASACORT AQ NA) Place 2 sprays into both nostrils daily as needed (dizziness from sinus).     No current facility-administered medications on file prior to visit.        Objective:   Physical Exam Blood pressure 102/68, pulse 64, temperature 97.7 F (36.5 C), height 5\' 4"  (1.626 m), weight 132 lb 4.8 oz (60 kg).  Alert and oriented. Skin warm and dry. Oral mucosa is moist.   . Sclera anicteric, conjunctivae is pink. Thyroid not  enlarged. No cervical lymphadenopathy. Lungs clear. Heart regular rate and rhythm.  Abdomen is soft. Bowel sounds are positive. No hepatomegaly. No abdominal masses felt. No tenderness.  No edema to lower extremities.         Assessment & Plan:  Diarrhea. Much better at this time. Take Imodium 1/2 tab daily. If any further problems, please call our office. OV 1 year.

## 2016-06-06 DIAGNOSIS — R7301 Impaired fasting glucose: Secondary | ICD-10-CM | POA: Diagnosis not present

## 2016-06-06 DIAGNOSIS — R002 Palpitations: Secondary | ICD-10-CM | POA: Diagnosis not present

## 2016-06-06 DIAGNOSIS — E559 Vitamin D deficiency, unspecified: Secondary | ICD-10-CM | POA: Diagnosis not present

## 2016-06-06 DIAGNOSIS — N183 Chronic kidney disease, stage 3 (moderate): Secondary | ICD-10-CM | POA: Diagnosis not present

## 2016-06-10 DIAGNOSIS — Z0000 Encounter for general adult medical examination without abnormal findings: Secondary | ICD-10-CM | POA: Diagnosis not present

## 2016-06-10 DIAGNOSIS — H832X9 Labyrinthine dysfunction, unspecified ear: Secondary | ICD-10-CM | POA: Diagnosis not present

## 2016-06-10 DIAGNOSIS — R002 Palpitations: Secondary | ICD-10-CM | POA: Diagnosis not present

## 2016-06-10 DIAGNOSIS — K580 Irritable bowel syndrome with diarrhea: Secondary | ICD-10-CM | POA: Diagnosis not present

## 2016-06-10 DIAGNOSIS — Z6822 Body mass index (BMI) 22.0-22.9, adult: Secondary | ICD-10-CM | POA: Diagnosis not present

## 2016-06-10 DIAGNOSIS — K219 Gastro-esophageal reflux disease without esophagitis: Secondary | ICD-10-CM | POA: Diagnosis not present

## 2016-10-31 DIAGNOSIS — M9902 Segmental and somatic dysfunction of thoracic region: Secondary | ICD-10-CM | POA: Diagnosis not present

## 2016-10-31 DIAGNOSIS — M9903 Segmental and somatic dysfunction of lumbar region: Secondary | ICD-10-CM | POA: Diagnosis not present

## 2016-10-31 DIAGNOSIS — M47816 Spondylosis without myelopathy or radiculopathy, lumbar region: Secondary | ICD-10-CM | POA: Diagnosis not present

## 2016-10-31 DIAGNOSIS — M9901 Segmental and somatic dysfunction of cervical region: Secondary | ICD-10-CM | POA: Diagnosis not present

## 2016-10-31 DIAGNOSIS — M546 Pain in thoracic spine: Secondary | ICD-10-CM | POA: Diagnosis not present

## 2016-10-31 DIAGNOSIS — M5442 Lumbago with sciatica, left side: Secondary | ICD-10-CM | POA: Diagnosis not present

## 2016-10-31 DIAGNOSIS — S134XXA Sprain of ligaments of cervical spine, initial encounter: Secondary | ICD-10-CM | POA: Diagnosis not present

## 2016-11-03 DIAGNOSIS — S134XXA Sprain of ligaments of cervical spine, initial encounter: Secondary | ICD-10-CM | POA: Diagnosis not present

## 2016-11-03 DIAGNOSIS — M9903 Segmental and somatic dysfunction of lumbar region: Secondary | ICD-10-CM | POA: Diagnosis not present

## 2016-11-03 DIAGNOSIS — M47816 Spondylosis without myelopathy or radiculopathy, lumbar region: Secondary | ICD-10-CM | POA: Diagnosis not present

## 2016-11-03 DIAGNOSIS — M5442 Lumbago with sciatica, left side: Secondary | ICD-10-CM | POA: Diagnosis not present

## 2016-11-03 DIAGNOSIS — M9901 Segmental and somatic dysfunction of cervical region: Secondary | ICD-10-CM | POA: Diagnosis not present

## 2016-11-03 DIAGNOSIS — M9902 Segmental and somatic dysfunction of thoracic region: Secondary | ICD-10-CM | POA: Diagnosis not present

## 2016-11-03 DIAGNOSIS — M546 Pain in thoracic spine: Secondary | ICD-10-CM | POA: Diagnosis not present

## 2016-11-06 DIAGNOSIS — M9901 Segmental and somatic dysfunction of cervical region: Secondary | ICD-10-CM | POA: Diagnosis not present

## 2016-11-06 DIAGNOSIS — M5442 Lumbago with sciatica, left side: Secondary | ICD-10-CM | POA: Diagnosis not present

## 2016-11-06 DIAGNOSIS — S134XXA Sprain of ligaments of cervical spine, initial encounter: Secondary | ICD-10-CM | POA: Diagnosis not present

## 2016-11-06 DIAGNOSIS — M9903 Segmental and somatic dysfunction of lumbar region: Secondary | ICD-10-CM | POA: Diagnosis not present

## 2016-11-06 DIAGNOSIS — M546 Pain in thoracic spine: Secondary | ICD-10-CM | POA: Diagnosis not present

## 2016-11-06 DIAGNOSIS — M47816 Spondylosis without myelopathy or radiculopathy, lumbar region: Secondary | ICD-10-CM | POA: Diagnosis not present

## 2016-11-06 DIAGNOSIS — M9902 Segmental and somatic dysfunction of thoracic region: Secondary | ICD-10-CM | POA: Diagnosis not present

## 2016-11-10 DIAGNOSIS — M546 Pain in thoracic spine: Secondary | ICD-10-CM | POA: Diagnosis not present

## 2016-11-10 DIAGNOSIS — M9901 Segmental and somatic dysfunction of cervical region: Secondary | ICD-10-CM | POA: Diagnosis not present

## 2016-11-10 DIAGNOSIS — S134XXA Sprain of ligaments of cervical spine, initial encounter: Secondary | ICD-10-CM | POA: Diagnosis not present

## 2016-11-10 DIAGNOSIS — M9902 Segmental and somatic dysfunction of thoracic region: Secondary | ICD-10-CM | POA: Diagnosis not present

## 2016-11-10 DIAGNOSIS — M9903 Segmental and somatic dysfunction of lumbar region: Secondary | ICD-10-CM | POA: Diagnosis not present

## 2016-11-10 DIAGNOSIS — M47816 Spondylosis without myelopathy or radiculopathy, lumbar region: Secondary | ICD-10-CM | POA: Diagnosis not present

## 2016-11-10 DIAGNOSIS — M5442 Lumbago with sciatica, left side: Secondary | ICD-10-CM | POA: Diagnosis not present

## 2016-11-17 DIAGNOSIS — M9902 Segmental and somatic dysfunction of thoracic region: Secondary | ICD-10-CM | POA: Diagnosis not present

## 2016-11-17 DIAGNOSIS — M9903 Segmental and somatic dysfunction of lumbar region: Secondary | ICD-10-CM | POA: Diagnosis not present

## 2016-11-17 DIAGNOSIS — M9901 Segmental and somatic dysfunction of cervical region: Secondary | ICD-10-CM | POA: Diagnosis not present

## 2016-11-17 DIAGNOSIS — S134XXA Sprain of ligaments of cervical spine, initial encounter: Secondary | ICD-10-CM | POA: Diagnosis not present

## 2016-11-17 DIAGNOSIS — M546 Pain in thoracic spine: Secondary | ICD-10-CM | POA: Diagnosis not present

## 2016-11-17 DIAGNOSIS — M47816 Spondylosis without myelopathy or radiculopathy, lumbar region: Secondary | ICD-10-CM | POA: Diagnosis not present

## 2016-11-17 DIAGNOSIS — M5442 Lumbago with sciatica, left side: Secondary | ICD-10-CM | POA: Diagnosis not present

## 2016-11-24 DIAGNOSIS — M9902 Segmental and somatic dysfunction of thoracic region: Secondary | ICD-10-CM | POA: Diagnosis not present

## 2016-11-24 DIAGNOSIS — M5442 Lumbago with sciatica, left side: Secondary | ICD-10-CM | POA: Diagnosis not present

## 2016-11-24 DIAGNOSIS — S134XXA Sprain of ligaments of cervical spine, initial encounter: Secondary | ICD-10-CM | POA: Diagnosis not present

## 2016-11-24 DIAGNOSIS — M546 Pain in thoracic spine: Secondary | ICD-10-CM | POA: Diagnosis not present

## 2016-11-24 DIAGNOSIS — M47816 Spondylosis without myelopathy or radiculopathy, lumbar region: Secondary | ICD-10-CM | POA: Diagnosis not present

## 2016-11-24 DIAGNOSIS — M9903 Segmental and somatic dysfunction of lumbar region: Secondary | ICD-10-CM | POA: Diagnosis not present

## 2016-11-24 DIAGNOSIS — M9901 Segmental and somatic dysfunction of cervical region: Secondary | ICD-10-CM | POA: Diagnosis not present

## 2016-12-03 DIAGNOSIS — M5442 Lumbago with sciatica, left side: Secondary | ICD-10-CM | POA: Diagnosis not present

## 2016-12-03 DIAGNOSIS — S134XXA Sprain of ligaments of cervical spine, initial encounter: Secondary | ICD-10-CM | POA: Diagnosis not present

## 2016-12-03 DIAGNOSIS — M9901 Segmental and somatic dysfunction of cervical region: Secondary | ICD-10-CM | POA: Diagnosis not present

## 2016-12-03 DIAGNOSIS — M9902 Segmental and somatic dysfunction of thoracic region: Secondary | ICD-10-CM | POA: Diagnosis not present

## 2016-12-03 DIAGNOSIS — M546 Pain in thoracic spine: Secondary | ICD-10-CM | POA: Diagnosis not present

## 2016-12-03 DIAGNOSIS — M47816 Spondylosis without myelopathy or radiculopathy, lumbar region: Secondary | ICD-10-CM | POA: Diagnosis not present

## 2016-12-03 DIAGNOSIS — M9903 Segmental and somatic dysfunction of lumbar region: Secondary | ICD-10-CM | POA: Diagnosis not present

## 2016-12-17 DIAGNOSIS — M9903 Segmental and somatic dysfunction of lumbar region: Secondary | ICD-10-CM | POA: Diagnosis not present

## 2016-12-17 DIAGNOSIS — M47816 Spondylosis without myelopathy or radiculopathy, lumbar region: Secondary | ICD-10-CM | POA: Diagnosis not present

## 2016-12-17 DIAGNOSIS — M546 Pain in thoracic spine: Secondary | ICD-10-CM | POA: Diagnosis not present

## 2016-12-17 DIAGNOSIS — M9902 Segmental and somatic dysfunction of thoracic region: Secondary | ICD-10-CM | POA: Diagnosis not present

## 2016-12-17 DIAGNOSIS — S134XXA Sprain of ligaments of cervical spine, initial encounter: Secondary | ICD-10-CM | POA: Diagnosis not present

## 2016-12-17 DIAGNOSIS — M5442 Lumbago with sciatica, left side: Secondary | ICD-10-CM | POA: Diagnosis not present

## 2016-12-17 DIAGNOSIS — M9901 Segmental and somatic dysfunction of cervical region: Secondary | ICD-10-CM | POA: Diagnosis not present

## 2017-01-05 ENCOUNTER — Ambulatory Visit (INDEPENDENT_AMBULATORY_CARE_PROVIDER_SITE_OTHER): Payer: Medicare Other | Admitting: Otolaryngology

## 2017-01-05 DIAGNOSIS — G501 Atypical facial pain: Secondary | ICD-10-CM

## 2017-02-02 ENCOUNTER — Ambulatory Visit (INDEPENDENT_AMBULATORY_CARE_PROVIDER_SITE_OTHER): Payer: Medicare Other | Admitting: Internal Medicine

## 2017-02-02 ENCOUNTER — Encounter (INDEPENDENT_AMBULATORY_CARE_PROVIDER_SITE_OTHER): Payer: Self-pay | Admitting: Internal Medicine

## 2017-02-02 VITALS — BP 132/80 | HR 65 | Temp 97.6°F | Ht 64.0 in | Wt 133.0 lb

## 2017-02-02 DIAGNOSIS — R197 Diarrhea, unspecified: Secondary | ICD-10-CM

## 2017-02-02 MED ORDER — METRONIDAZOLE 500 MG PO TABS: 500.0000 mg | ORAL_TABLET | Freq: Two times a day (BID) | ORAL | 0 refills | Status: DC

## 2017-02-02 NOTE — Patient Instructions (Signed)
GI pathogen.  Rx for Flagyl 500mg  BID x 7 day.

## 2017-02-02 NOTE — Progress Notes (Signed)
   Subjective:    Patient ID: Rhonda Andrews, female    DOB: 1941-05-03, 76 y.o.   MRN: [id number]  HPI   Presents today with c/o diarrhea since Saturday. Put on 30 gallons of week killer. She says she get sick everytime she uses the week killer. Having 10-15 stools a day x 2 day. Did see a small amt of rectal bleeding.  This am, she has gone 1 time. She does feel better this am.  Appetite is good. No weight loss.  Last colonoscopy in 2010: Left sided diverticulosis. Normal terminal ileum. Small external hemorrhoids.   Hx of C-diff in past.  Review of Systems Past Medical History:  Diagnosis Date  . Clostridium difficile diarrhea 02/05/2015  . 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 Heart rate fast     Past Surgical History:  Procedure Laterality Date  . ABDOMINAL HYSTERECTOMY     ovaries remained  . CHOLECYSTECTOMY  3 yrs ago  . lactose intolerant    . Precancerous  Tissue - Nose  February 14-2014    Allergies  Allergen Reactions  . Prednisone Other (See Comments)    Heart races    Current Outpatient Prescriptions on File Prior to Visit  Medication Sig Dispense Refill  . cholecalciferol (VITAMIN D) 1000 units tablet Take 1,000 Units by mouth daily.    Marland Kitchen lactobacillus acidophilus (BACID) TABS tablet Take 1 tablet by mouth 3 (three) times daily.    . propranolol ER (INDERAL LA) 60 MG 24 hr capsule Take 1 capsule (60 mg total) by mouth daily. (Patient taking differently: Take 30 mg by mouth daily with supper. ) 30 capsule 6  . Triamcinolone Acetonide (NASACORT AQ NA) Place 2 sprays into both nostrils daily as needed (dizziness from sinus).     No current facility-administered medications on file prior to visit.         Objective:   Physical Exam Blood pressure 132/80, pulse 65, temperature 97.6 F (36.4 C), height 5\' 4"  (1.626 m), weight 133 lb (60.3 kg). Alert and oriented. Skin warm and dry. Sclera anicteric. Lungs clear. HR regular. No murmur. No hepatomegaly, No splenomegaly.  Abdomen soft, BS+. No tenderness.           Assessment & Plan:  Diarrhea. GI pathogen.  Rx for Flagyl 50mg  BID x 7 day. Further recommendations to follow.

## 2017-02-05 ENCOUNTER — Telehealth: Payer: Self-pay | Admitting: Internal Medicine

## 2017-02-05 LAB — GASTROINTESTINAL PATHOGEN PANEL PCR
C. difficile Tox A/B, PCR: NOT DETECTED
Campylobacter, PCR: DETECTED — CR
Cryptosporidium, PCR: NOT DETECTED
E COLI (ETEC) LT/ST, PCR: NOT DETECTED
E COLI (STEC) STX1/STX2, PCR: NOT DETECTED
E coli 0157, PCR: NOT DETECTED
Giardia lamblia, PCR: NOT DETECTED
NOROVIRUS, PCR: NOT DETECTED
Rotavirus A, PCR: NOT DETECTED
SALMONELLA, PCR: NOT DETECTED
SHIGELLA, PCR: NOT DETECTED

## 2017-02-05 NOTE — Telephone Encounter (Signed)
Lab called; campylobacter in stool; cdiff negative.  I informed pt.  3 weeks of diarrhea; now w a little blood. Pt is 75.  Flagyl making her sick.  I recommended stopping flagyl.  3 day course of azithromycin - 500 mg daily.  Stop Imodium. May continue probiotic but take 6-8hrs away from abx dosing time. F/u w Dr. Laural Golden. Rx to be called into [employer] [facility]

## 2017-02-06 NOTE — Telephone Encounter (Signed)
I spoke to pt and she said she spoke to Terri this morning and she said that Dr. Laural Golden said he preferred her not to take the antibiotic because of the C-Diff she has had.  She said for me not to call in the antibiotic, she will just ride it out.

## 2017-02-06 NOTE — Telephone Encounter (Signed)
Communication noted.  

## 2017-02-12 ENCOUNTER — Other Ambulatory Visit (INDEPENDENT_AMBULATORY_CARE_PROVIDER_SITE_OTHER): Payer: Self-pay | Admitting: *Deleted

## 2017-02-12 DIAGNOSIS — R197 Diarrhea, unspecified: Secondary | ICD-10-CM

## 2017-02-12 NOTE — Telephone Encounter (Signed)
Talked the patient. She decided not to take antibiotics for Campylobacter. She states she has stopped having "normal diarrhea" which she had for 15 years. She had 2 stools yesterday. We will repeat GI pathogen panel in 1 month to document spontaneous clearance.

## 2017-02-25 ENCOUNTER — Other Ambulatory Visit (INDEPENDENT_AMBULATORY_CARE_PROVIDER_SITE_OTHER): Payer: Self-pay | Admitting: *Deleted

## 2017-02-25 ENCOUNTER — Encounter (INDEPENDENT_AMBULATORY_CARE_PROVIDER_SITE_OTHER): Payer: Self-pay | Admitting: *Deleted

## 2017-02-25 ENCOUNTER — Telehealth (INDEPENDENT_AMBULATORY_CARE_PROVIDER_SITE_OTHER): Payer: Self-pay | Admitting: *Deleted

## 2017-02-25 DIAGNOSIS — R197 Diarrhea, unspecified: Secondary | ICD-10-CM

## 2017-02-25 NOTE — Telephone Encounter (Signed)
Chart opened in error

## 2017-03-05 DIAGNOSIS — R197 Diarrhea, unspecified: Secondary | ICD-10-CM | POA: Diagnosis not present

## 2017-03-09 LAB — GASTROINTESTINAL PATHOGEN PANEL PCR
C. DIFFICILE TOX A/B, PCR: DETECTED — AB
CRYPTOSPORIDIUM, PCR: NOT DETECTED
Campylobacter, PCR: NOT DETECTED
E coli (ETEC) LT/ST PCR: NOT DETECTED
E coli (STEC) stx1/stx2, PCR: NOT DETECTED
E coli 0157, PCR: NOT DETECTED
GIARDIA LAMBLIA, PCR: NOT DETECTED
NOROVIRUS, PCR: NOT DETECTED
ROTAVIRUS, PCR: NOT DETECTED
Salmonella, PCR: NOT DETECTED
Shigella, PCR: NOT DETECTED

## 2017-03-12 ENCOUNTER — Other Ambulatory Visit (INDEPENDENT_AMBULATORY_CARE_PROVIDER_SITE_OTHER): Payer: Self-pay | Admitting: *Deleted

## 2017-03-12 DIAGNOSIS — A0472 Enterocolitis due to Clostridium difficile, not specified as recurrent: Secondary | ICD-10-CM

## 2017-03-12 MED ORDER — VANCOMYCIN HCL 125 MG PO CAPS: 125.0000 mg | ORAL_CAPSULE | Freq: Four times a day (QID) | ORAL | 1 refills | Status: DC

## 2017-03-26 ENCOUNTER — Telehealth (INDEPENDENT_AMBULATORY_CARE_PROVIDER_SITE_OTHER): Payer: Self-pay | Admitting: Internal Medicine

## 2017-03-26 DIAGNOSIS — G441 Vascular headache, not elsewhere classified: Secondary | ICD-10-CM | POA: Diagnosis not present

## 2017-03-26 DIAGNOSIS — M546 Pain in thoracic spine: Secondary | ICD-10-CM | POA: Diagnosis not present

## 2017-03-26 DIAGNOSIS — S338XXA Sprain of other parts of lumbar spine and pelvis, initial encounter: Secondary | ICD-10-CM | POA: Diagnosis not present

## 2017-03-26 DIAGNOSIS — M9902 Segmental and somatic dysfunction of thoracic region: Secondary | ICD-10-CM | POA: Diagnosis not present

## 2017-03-26 DIAGNOSIS — M9903 Segmental and somatic dysfunction of lumbar region: Secondary | ICD-10-CM | POA: Diagnosis not present

## 2017-03-26 DIAGNOSIS — M9901 Segmental and somatic dysfunction of cervical region: Secondary | ICD-10-CM | POA: Diagnosis not present

## 2017-03-26 NOTE — Telephone Encounter (Signed)
Patient called, stated that she was supposed to call Dr. Laural Golden when she was one day before finishing her medicine, Vancomycin.  [phone]

## 2017-03-26 NOTE — Telephone Encounter (Signed)
Per Dr.Rehman the patient should continue the medication until it is completed.Patient was made aware. The patient did say that she is having formed soft stool. She will call office when she has completed all the mediation.

## 2017-03-26 NOTE — Telephone Encounter (Signed)
Talked with Ms. Rhonda Andrews. She states that she is a lot better with the diarrhea, She was not able to take the 4 a day, she could only take 3 a day. She ask with today being day 13 , if she should continue to take the others to complete the treatment.  Patient was advised that this would be addressed with Dr.REhman and I Would call her back. [phone].

## 2017-03-30 ENCOUNTER — Telehealth (INDEPENDENT_AMBULATORY_CARE_PROVIDER_SITE_OTHER): Payer: Self-pay | Admitting: Internal Medicine

## 2017-03-30 NOTE — Telephone Encounter (Signed)
This will be dicussed with Dr.Rehman.

## 2017-03-30 NOTE — Telephone Encounter (Signed)
Patient called, stated that she is calling regarding the Vancomycin she is taking.  She stated that she had a terrible weekend and she increased the Vancomycin and has had diarrhea something terrible.  She wants to know what she needs to do.  [phone]

## 2017-03-30 NOTE — Telephone Encounter (Signed)
The patient presented to the office, I told her that this will be discussed with Dr. Laural Golden.  He is off today, so it may be tomorrow.

## 2017-04-01 DIAGNOSIS — M9902 Segmental and somatic dysfunction of thoracic region: Secondary | ICD-10-CM | POA: Diagnosis not present

## 2017-04-01 DIAGNOSIS — M9901 Segmental and somatic dysfunction of cervical region: Secondary | ICD-10-CM | POA: Diagnosis not present

## 2017-04-01 DIAGNOSIS — S338XXA Sprain of other parts of lumbar spine and pelvis, initial encounter: Secondary | ICD-10-CM | POA: Diagnosis not present

## 2017-04-01 DIAGNOSIS — G441 Vascular headache, not elsewhere classified: Secondary | ICD-10-CM | POA: Diagnosis not present

## 2017-04-01 DIAGNOSIS — M9903 Segmental and somatic dysfunction of lumbar region: Secondary | ICD-10-CM | POA: Diagnosis not present

## 2017-04-01 DIAGNOSIS — M546 Pain in thoracic spine: Secondary | ICD-10-CM | POA: Diagnosis not present

## 2017-04-01 NOTE — Telephone Encounter (Signed)
Dr.Rehman - I mentioned this to you. Do we need to do GI Pathogen , give additional medication or preceded with something else?   Patient called, stated that she is calling regarding the Vancomycin she is taking.  She stated that she had a terrible weekend and she increased the Vancomycin and has had diarrhea something terrible.  She wants to know what she needs to do.  [phone]

## 2017-04-02 ENCOUNTER — Telehealth (INDEPENDENT_AMBULATORY_CARE_PROVIDER_SITE_OTHER): Payer: Self-pay | Admitting: *Deleted

## 2017-04-02 ENCOUNTER — Other Ambulatory Visit (INDEPENDENT_AMBULATORY_CARE_PROVIDER_SITE_OTHER): Payer: Self-pay | Admitting: *Deleted

## 2017-04-02 DIAGNOSIS — R197 Diarrhea, unspecified: Secondary | ICD-10-CM

## 2017-04-02 NOTE — Telephone Encounter (Signed)
Was addressed earlier today. Please see Rhonda Andrews's note

## 2017-04-02 NOTE — Telephone Encounter (Signed)
Mrs. Mangen has been afebrile. She tells me that she was doing well with taking the Vancomycin,she did not complete the therapy. She was to have taken Vancomycin 4 times a day , but she could only take it 3 times a day. The extra ones she did not take. She says that it caused her to have the diarrhea.  From the previous telephone encounter Dr.Rehman ask that we do C-Diff [place] Quick Scan, and the patient take Vancomycin 250 mg 1 by mouth 4 times daily for 2 weeks with 1 refill. This was called to [facility].   Patient's concern is that the Vancomycin may be causing the diarrhea.

## 2017-04-03 DIAGNOSIS — R197 Diarrhea, unspecified: Secondary | ICD-10-CM | POA: Diagnosis not present

## 2017-04-04 LAB — C. DIFFICILE GDH AND TOXIN A/B
C. DIFFICILE GDH: NOT DETECTED
C. difficile Toxin A/B: NOT DETECTED

## 2017-04-08 DIAGNOSIS — M9901 Segmental and somatic dysfunction of cervical region: Secondary | ICD-10-CM | POA: Diagnosis not present

## 2017-04-08 DIAGNOSIS — M546 Pain in thoracic spine: Secondary | ICD-10-CM | POA: Diagnosis not present

## 2017-04-08 DIAGNOSIS — G441 Vascular headache, not elsewhere classified: Secondary | ICD-10-CM | POA: Diagnosis not present

## 2017-04-08 DIAGNOSIS — Z85828 Personal history of other malignant neoplasm of skin: Secondary | ICD-10-CM | POA: Diagnosis not present

## 2017-04-08 DIAGNOSIS — D225 Melanocytic nevi of trunk: Secondary | ICD-10-CM | POA: Diagnosis not present

## 2017-04-08 DIAGNOSIS — D485 Neoplasm of uncertain behavior of skin: Secondary | ICD-10-CM | POA: Diagnosis not present

## 2017-04-08 DIAGNOSIS — L570 Actinic keratosis: Secondary | ICD-10-CM | POA: Diagnosis not present

## 2017-04-08 DIAGNOSIS — M9902 Segmental and somatic dysfunction of thoracic region: Secondary | ICD-10-CM | POA: Diagnosis not present

## 2017-04-08 DIAGNOSIS — S338XXA Sprain of other parts of lumbar spine and pelvis, initial encounter: Secondary | ICD-10-CM | POA: Diagnosis not present

## 2017-04-08 DIAGNOSIS — M9903 Segmental and somatic dysfunction of lumbar region: Secondary | ICD-10-CM | POA: Diagnosis not present

## 2017-04-08 DIAGNOSIS — L905 Scar conditions and fibrosis of skin: Secondary | ICD-10-CM | POA: Diagnosis not present

## 2017-04-21 ENCOUNTER — Telehealth (INDEPENDENT_AMBULATORY_CARE_PROVIDER_SITE_OTHER): Payer: Self-pay | Admitting: *Deleted

## 2017-04-21 NOTE — Telephone Encounter (Signed)
Patient presented to the office, stated that she doesn't feel good. Energy level is down, stated that she has been eating a lot of banana's.  Still having some diarrhea, lightheadednedss and eyes are twitching.  She stated that she has been taking calcium, magnesium, Zinc, B-12, D-3, probiotic and propranolol. She would like suggestions.  She is supposed to go on vacation next week and just doesn't feel good.  [phone]

## 2017-04-22 ENCOUNTER — Other Ambulatory Visit (INDEPENDENT_AMBULATORY_CARE_PROVIDER_SITE_OTHER): Payer: Self-pay | Admitting: *Deleted

## 2017-04-22 DIAGNOSIS — R253 Fasciculation: Secondary | ICD-10-CM | POA: Diagnosis not present

## 2017-04-22 DIAGNOSIS — R197 Diarrhea, unspecified: Secondary | ICD-10-CM

## 2017-04-22 DIAGNOSIS — R531 Weakness: Secondary | ICD-10-CM

## 2017-04-22 NOTE — Telephone Encounter (Signed)
Addressed with Dr.Rehman  He ask that the patient  Take 1 Imodium daily, she may also eat the Activa Yogurt. Patient was called and made aware. She states that she is already taking the Imodium every morning when she wakes up. Patient feels that her Magnesium is low and she would like to have it checked.  Per Dr.Rehman may order Magnesium , and Met- 7. Orders completed and the patient was made aware.

## 2017-04-23 LAB — BASIC METABOLIC PANEL
BUN: 30 mg/dL — AB (ref 7–25)
CHLORIDE: 105 mmol/L (ref 98–110)
CO2: 27 mmol/L (ref 20–32)
Calcium: 9.1 mg/dL (ref 8.6–10.4)
Creat: 0.88 mg/dL (ref 0.60–0.93)
Glucose, Bld: 156 mg/dL — ABNORMAL HIGH (ref 65–99)
POTASSIUM: 4.3 mmol/L (ref 3.5–5.3)
SODIUM: 141 mmol/L (ref 135–146)

## 2017-04-23 LAB — MAGNESIUM: MAGNESIUM: 1.9 mg/dL (ref 1.5–2.5)

## 2017-04-27 ENCOUNTER — Encounter (INDEPENDENT_AMBULATORY_CARE_PROVIDER_SITE_OTHER): Payer: Self-pay | Admitting: *Deleted

## 2017-04-27 ENCOUNTER — Other Ambulatory Visit (INDEPENDENT_AMBULATORY_CARE_PROVIDER_SITE_OTHER): Payer: Self-pay | Admitting: *Deleted

## 2017-04-27 DIAGNOSIS — R799 Abnormal finding of blood chemistry, unspecified: Secondary | ICD-10-CM

## 2017-04-27 DIAGNOSIS — R7989 Other specified abnormal findings of blood chemistry: Secondary | ICD-10-CM

## 2017-04-30 ENCOUNTER — Encounter (INDEPENDENT_AMBULATORY_CARE_PROVIDER_SITE_OTHER): Payer: Self-pay | Admitting: Internal Medicine

## 2017-05-13 DIAGNOSIS — R799 Abnormal finding of blood chemistry, unspecified: Secondary | ICD-10-CM | POA: Diagnosis not present

## 2017-05-13 DIAGNOSIS — R7989 Other specified abnormal findings of blood chemistry: Secondary | ICD-10-CM | POA: Diagnosis not present

## 2017-05-13 LAB — BASIC METABOLIC PANEL WITH GFR
BUN: 25 mg/dL (ref 7–25)
CO2: 28 mmol/L (ref 20–32)
CREATININE: 0.78 mg/dL (ref 0.60–0.93)
Calcium: 9 mg/dL (ref 8.6–10.4)
Chloride: 105 mmol/L (ref 98–110)
GFR, EST NON AFRICAN AMERICAN: 75 mL/min/{1.73_m2} (ref >=60)
GFR, Est African American: 87 mL/min/{1.73_m2} (ref >=60)
GLUCOSE: 88 mg/dL (ref 65–99)
POTASSIUM: 4.4 mmol/L (ref 3.5–5.3)
SODIUM: 142 mmol/L (ref 135–146)

## 2017-05-14 DIAGNOSIS — H43392 Other vitreous opacities, left eye: Secondary | ICD-10-CM | POA: Diagnosis not present

## 2017-05-18 DIAGNOSIS — M9903 Segmental and somatic dysfunction of lumbar region: Secondary | ICD-10-CM | POA: Diagnosis not present

## 2017-05-18 DIAGNOSIS — G441 Vascular headache, not elsewhere classified: Secondary | ICD-10-CM | POA: Diagnosis not present

## 2017-05-18 DIAGNOSIS — M9901 Segmental and somatic dysfunction of cervical region: Secondary | ICD-10-CM | POA: Diagnosis not present

## 2017-05-18 DIAGNOSIS — M546 Pain in thoracic spine: Secondary | ICD-10-CM | POA: Diagnosis not present

## 2017-05-18 DIAGNOSIS — M9902 Segmental and somatic dysfunction of thoracic region: Secondary | ICD-10-CM | POA: Diagnosis not present

## 2017-05-18 DIAGNOSIS — S338XXA Sprain of other parts of lumbar spine and pelvis, initial encounter: Secondary | ICD-10-CM | POA: Diagnosis not present

## 2017-06-01 DIAGNOSIS — G441 Vascular headache, not elsewhere classified: Secondary | ICD-10-CM | POA: Diagnosis not present

## 2017-06-01 DIAGNOSIS — M9903 Segmental and somatic dysfunction of lumbar region: Secondary | ICD-10-CM | POA: Diagnosis not present

## 2017-06-01 DIAGNOSIS — M546 Pain in thoracic spine: Secondary | ICD-10-CM | POA: Diagnosis not present

## 2017-06-01 DIAGNOSIS — M9902 Segmental and somatic dysfunction of thoracic region: Secondary | ICD-10-CM | POA: Diagnosis not present

## 2017-06-01 DIAGNOSIS — M9901 Segmental and somatic dysfunction of cervical region: Secondary | ICD-10-CM | POA: Diagnosis not present

## 2017-06-01 DIAGNOSIS — S338XXA Sprain of other parts of lumbar spine and pelvis, initial encounter: Secondary | ICD-10-CM | POA: Diagnosis not present

## 2017-06-04 ENCOUNTER — Encounter (INDEPENDENT_AMBULATORY_CARE_PROVIDER_SITE_OTHER): Payer: Self-pay | Admitting: Internal Medicine

## 2017-06-04 ENCOUNTER — Ambulatory Visit (INDEPENDENT_AMBULATORY_CARE_PROVIDER_SITE_OTHER): Payer: Medicare Other | Admitting: Internal Medicine

## 2017-06-04 VITALS — BP 134/60 | HR 64 | Temp 97.6°F | Ht 63.0 in | Wt 133.0 lb

## 2017-06-04 DIAGNOSIS — R197 Diarrhea, unspecified: Secondary | ICD-10-CM | POA: Diagnosis not present

## 2017-06-04 NOTE — Patient Instructions (Signed)
GI pathogen   

## 2017-06-04 NOTE — Progress Notes (Signed)
   Subjective:    Patient ID: Rhonda Andrews, female    DOB: November 21, 1940, 76 y.o.   MRN: [id number] Weight 133 in June.  PCP Dr. Scotty Court.  HPI  Here today for f/u. Last seen in June for diarrhea. Stool was positive for Campylobacter.  In July she had a positive C diff and covered with Vancomycin.  She tells me she is light headed.  She says she has diarrhea. She takes Imodium as needed. She says she is having diarrhea. She is having 3-4 stools a day. No recent antibiotics.    Review of Systems Past Medical History:  Diagnosis Date  . Clostridium difficile diarrhea 02/05/2015  . 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 Heart rate fast     Past Surgical History:  Procedure Laterality Date  . ABDOMINAL HYSTERECTOMY     ovaries remained  . CHOLECYSTECTOMY  3 yrs ago  . lactose intolerant    . Precancerous  Tissue - Nose  February 14-2014    Allergies  Allergen Reactions  . Prednisone Other (See Comments)    Heart races    Current Outpatient Prescriptions on File Prior to Visit  Medication Sig Dispense Refill  . cholecalciferol (VITAMIN D) 1000 units tablet Take 1,000 Units by mouth daily.    Marland Kitchen lactobacillus acidophilus (BACID) TABS tablet Take 1 tablet by mouth 3 (three) times daily.    Marland Kitchen loperamide (IMODIUM) 2 MG capsule Take by mouth as needed for diarrhea or loose stools.    . metroNIDAZOLE (FLAGYL) 500 MG tablet Take 1 tablet (500 mg total) by mouth 2 (two) times daily. 14 tablet 0  . ondansetron (ZOFRAN) 4 MG tablet Take 4 mg by mouth every 8 (eight) hours as needed for nausea or vomiting.    . propranolol ER (INDERAL LA) 60 MG 24 hr capsule Take 1 capsule (60 mg total) by mouth daily. (Patient taking differently: Take 30 mg by mouth daily with supper. ) 30 capsule 6  . Triamcinolone Acetonide (NASACORT AQ NA) Place 2 sprays into both nostrils daily as needed (dizziness from sinus).     No current facility-administered medications on file prior to visit.         Objective:   Physical  Exam Blood pressure 134/60, pulse 64, temperature 97.6 F (36.4 C), height 5\' 3"  (1.6 m), weight 133 lb (60.3 kg). Alert and oriented. Skin warm and dry. Oral mucosa is moist.   . Sclera anicteric, conjunctivae is pink. Thyroid not enlarged. No cervical lymphadenopathy. Lungs clear. Heart regular rate and rhythm.  Abdomen is soft. Bowel sounds are positive. No hepatomegaly. No abdominal masses felt. No tenderness.  No edema to lower extremities.           Assessment & Plan:  Diarrhea. Hx of C-diff . Will get a GI pathogen.  Further recommendations to follow

## 2017-06-09 ENCOUNTER — Ambulatory Visit (INDEPENDENT_AMBULATORY_CARE_PROVIDER_SITE_OTHER): Payer: Medicare Other | Admitting: *Deleted

## 2017-06-09 DIAGNOSIS — R42 Dizziness and giddiness: Secondary | ICD-10-CM | POA: Diagnosis not present

## 2017-06-09 NOTE — Progress Notes (Signed)
Patient walked into to office to make an appointment with Dr. Harl Bowie for continued c/o dizziness.  Last seen 11/06/2014.  That note mentions palpitations controlled on Propranolol & dizziness at that time as well.  States that she has had dizziness x 15 years, but worse the last few weeks.  Ringing in her ears x 20 years.  Has seen Dr. Melene Plan (ENT) approximately 8 months ago - did not feel that she had Meniere's disease & sent her to a dentist.  Had shooting pain from teet up into sinus area - went to dentist & all checked out okay.  Does state that she continues to have diarrhea / GI issues all the time.  Suggested G2 to help replace electrolytes due to the diarrhea all the time.  Stated that she can not tolerate it - tears her stomach up.  Stated that Dr. Laural Golden had told her to sprinkle Pedialyte powder in some water to drink, but unable to tolerate much of that either.  Stated that her BP's this morning at home at 8:00 am was:  R arm - 116/70  68 & L arm - 122/72  64.  At home her normal range is 115/70 and heart rate 75-80.  Medication list updated today.  OV given for tomorrow at 2:20 here in [place] office with Dr. Bronson Ing as Dr. Harl Bowie is out of the office this week.

## 2017-06-10 ENCOUNTER — Encounter: Payer: Self-pay | Admitting: Cardiovascular Disease

## 2017-06-10 ENCOUNTER — Ambulatory Visit (INDEPENDENT_AMBULATORY_CARE_PROVIDER_SITE_OTHER): Payer: Medicare Other | Admitting: Cardiovascular Disease

## 2017-06-10 VITALS — BP 118/82 | HR 65 | Ht 64.0 in | Wt 136.0 lb

## 2017-06-10 DIAGNOSIS — R42 Dizziness and giddiness: Secondary | ICD-10-CM | POA: Diagnosis not present

## 2017-06-10 DIAGNOSIS — R002 Palpitations: Secondary | ICD-10-CM

## 2017-06-10 NOTE — Progress Notes (Signed)
SUBJECTIVE: This is a patient of Dr. branch was inadvertently added to my schedule. She has a history of palpitations and takes propranolol. A previous Holter monitor in September 2010 showed no significant arrhythmias. She apparently has a history of vertigo and is followed by ENT. She also has chronic diarrhea and dizziness.  She came to our office yesterday complaining of dizziness. Chronic dizziness of 15 years has apparently gotten worse in the last few weeks. She saw ENT approximately 8 months ago and they did not feel she had Mnire's disease and referred her to a dentist.  She denies problems with balance, walking, and falls. She said she drinks 6-7 small bottles of water daily. It has not improved her symptoms. She denies chest pain, shortness of breath, and palpitations. She describes right tooth pain radiating into her right maxillary sinus. She denies syncope. She follows with GI for chronic diarrhea.  ECG performed in the office today which I ordered and personally interpreted demonstrated sinus rhythm with anteroseptal Q waves. Overall there are no significant changes from ECG performed in 2016.  Review of Systems: As per "subjective", otherwise negative.  Allergies  Allergen Reactions  . Prednisone Other (See Comments)    Heart races    Current Outpatient Prescriptions  Medication Sig Dispense Refill  . cholecalciferol (VITAMIN D) 1000 units tablet Take 1,000 Units by mouth daily.    Marland Kitchen lactobacillus acidophilus (BACID) TABS tablet Take 1 tablet by mouth daily.     Marland Kitchen loperamide (IMODIUM) 2 MG capsule Take by mouth as needed for diarrhea or loose stools.    . propranolol ER (INDERAL LA) 60 MG 24 hr capsule Take 1 capsule (60 mg total) by mouth daily. (Patient taking differently: Take 30 mg by mouth daily with supper. ) 30 capsule 6   No current facility-administered medications for this visit.     Past Medical History:  Diagnosis Date  . Clostridium difficile  diarrhea 02/05/2015  . 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 Heart rate fast     Past Surgical History:  Procedure Laterality Date  . ABDOMINAL HYSTERECTOMY     ovaries remained  . CHOLECYSTECTOMY  3 yrs ago  . lactose intolerant    . Precancerous  Tissue - Nose  February 14-2014    Social History   Social History  . Marital status: Married    Spouse name: N/A  . Number of children: N/A  . Years of education: N/A   Occupational History  . Not on file.   Social History Main Topics  . Smoking status: Never Smoker  . Smokeless tobacco: Never Used  . Alcohol use No  . Drug use: No  . Sexual activity: Not on file   Other Topics Concern  . Not on file   Social History Narrative  . No narrative on file     Vitals:   06/10/17 1429  BP: 118/82  Pulse: 65  SpO2: 98%  Weight: 136 lb (61.7 kg)  Height: 5\' 4"  (1.626 m)    Wt Readings from Last 3 Encounters:  06/10/17 136 lb (61.7 kg)  06/04/17 133 lb (60.3 kg)  02/02/17 133 lb (60.3 kg)     PHYSICAL EXAM General: NAD HEENT: Normal. Neck: No JVD, no thyromegaly. Lungs: Clear to auscultation bilaterally with normal respiratory effort. CV: Nondisplaced PMI.  Regular rate and rhythm, normal S1/S2, no S3/S4, no murmur. No pretibial or periankle edema.  No carotid bruit.   Abdomen: Soft, nontender, no distention.  Neurologic:  Alert and oriented.  Psych: Normal affect. Skin: Normal. Musculoskeletal: No gross deformities.    ECG: Most recent ECG reviewed.   Labs: Lab Results  Component Value Date/Time   K 4.4 05/13/2017 08:33 AM   BUN 25 05/13/2017 08:33 AM   CREATININE 0.78 05/13/2017 08:33 AM   ALT 10 06/19/2015 02:39 PM   HGB 15.1 (H) 06/19/2015 02:39 PM     Lipids: No results found for: LDLCALC, LDLDIRECT, CHOL, TRIG, HDL     ASSESSMENT AND PLAN: 1. Dizziness: It appears this is not cardiac nor dental in etiology. ENT did not feel it was ear-related. She appears to be hydrating herself adequately.  I am uncertain  if she has issues related to her posterior cerebral circulation, in which case an MRI may be indicated for optimal visualization. I spoke to her about this. I will make a neurology referral.  2. Palpitations: Well controlled on propranolol. No changes to therapy.      Disposition: Follow up 1 year with Dr. Harl Bowie.   Kate Sable, M.D., F.A.C.C.

## 2017-06-10 NOTE — Patient Instructions (Signed)
Your physician wants you to follow-up in: [place] will receive a reminder letter in the mail two months in advance. If you don't receive a letter, please call our office to schedule the follow-up appointment.  Your physician recommends that you continue on your current medications as directed. Please refer to the Current Medication list given to you today.  You have been referred to NEUROLOGY   Thank you for choosing [facility]!!

## 2017-06-11 ENCOUNTER — Telehealth (INDEPENDENT_AMBULATORY_CARE_PROVIDER_SITE_OTHER): Payer: Self-pay | Admitting: Internal Medicine

## 2017-06-11 ENCOUNTER — Ambulatory Visit: Payer: Medicare Other | Admitting: Adult Health

## 2017-06-11 LAB — GASTROINTESTINAL PATHOGEN PANEL PCR
C. difficile Tox A/B, PCR: DETECTED — AB
CRYPTOSPORIDIUM, PCR: NOT DETECTED
Campylobacter, PCR: NOT DETECTED
E COLI (STEC) STX1/STX2, PCR: NOT DETECTED
E coli (ETEC) LT/ST PCR: NOT DETECTED
E coli 0157, PCR: NOT DETECTED
Giardia lamblia, PCR: NOT DETECTED
NOROVIRUS, PCR: NOT DETECTED
ROTAVIRUS, PCR: NOT DETECTED
SHIGELLA, PCR: NOT DETECTED
Salmonella, PCR: DETECTED — AB

## 2017-06-11 NOTE — Telephone Encounter (Signed)
Patient called, she would like her lab results.  [phone]

## 2017-06-15 ENCOUNTER — Encounter (HOSPITAL_COMMUNITY): Payer: Self-pay | Admitting: Emergency Medicine

## 2017-06-15 ENCOUNTER — Ambulatory Visit (INDEPENDENT_AMBULATORY_CARE_PROVIDER_SITE_OTHER): Payer: Medicare Other | Admitting: Otolaryngology

## 2017-06-15 ENCOUNTER — Emergency Department (HOSPITAL_COMMUNITY): Payer: Medicare Other

## 2017-06-15 ENCOUNTER — Telehealth (INDEPENDENT_AMBULATORY_CARE_PROVIDER_SITE_OTHER): Payer: Self-pay | Admitting: Internal Medicine

## 2017-06-15 ENCOUNTER — Inpatient Hospital Stay (HOSPITAL_COMMUNITY)
Admission: EM | Admit: 2017-06-15 | Discharge: 2017-06-18 | DRG: 372 | Disposition: A | Discharge status: Home / Self Care | Payer: Medicare Other | Attending: Family Medicine | Admitting: Family Medicine

## 2017-06-15 DIAGNOSIS — I471 Supraventricular tachycardia: Secondary | ICD-10-CM | POA: Diagnosis present

## 2017-06-15 DIAGNOSIS — I951 Orthostatic hypotension: Secondary | ICD-10-CM | POA: Diagnosis present

## 2017-06-15 DIAGNOSIS — Z807 Family history of other malignant neoplasms of lymphoid, hematopoietic and related tissues: Secondary | ICD-10-CM

## 2017-06-15 DIAGNOSIS — N390 Urinary tract infection, site not specified: Secondary | ICD-10-CM | POA: Diagnosis not present

## 2017-06-15 DIAGNOSIS — I248 Other forms of acute ischemic heart disease: Secondary | ICD-10-CM | POA: Diagnosis not present

## 2017-06-15 DIAGNOSIS — R7989 Other specified abnormal findings of blood chemistry: Secondary | ICD-10-CM | POA: Diagnosis not present

## 2017-06-15 DIAGNOSIS — A0472 Enterocolitis due to Clostridium difficile, not specified as recurrent: Secondary | ICD-10-CM

## 2017-06-15 DIAGNOSIS — Z800 Family history of malignant neoplasm of digestive organs: Secondary | ICD-10-CM

## 2017-06-15 DIAGNOSIS — R9082 White matter disease, unspecified: Secondary | ICD-10-CM | POA: Diagnosis not present

## 2017-06-15 DIAGNOSIS — R778 Other specified abnormalities of plasma proteins: Secondary | ICD-10-CM | POA: Diagnosis present

## 2017-06-15 DIAGNOSIS — Z888 Allergy status to other drugs, medicaments and biological substances status: Secondary | ICD-10-CM

## 2017-06-15 DIAGNOSIS — R030 Elevated blood-pressure reading, without diagnosis of hypertension: Secondary | ICD-10-CM | POA: Diagnosis not present

## 2017-06-15 DIAGNOSIS — R519 Headache, unspecified: Secondary | ICD-10-CM

## 2017-06-15 DIAGNOSIS — I10 Essential (primary) hypertension: Secondary | ICD-10-CM | POA: Diagnosis not present

## 2017-06-15 DIAGNOSIS — R51 Headache: Secondary | ICD-10-CM

## 2017-06-15 DIAGNOSIS — Z79899 Other long term (current) drug therapy: Secondary | ICD-10-CM

## 2017-06-15 DIAGNOSIS — Z90710 Acquired absence of both cervix and uterus: Secondary | ICD-10-CM | POA: Diagnosis not present

## 2017-06-15 DIAGNOSIS — Z9049 Acquired absence of other specified parts of digestive tract: Secondary | ICD-10-CM

## 2017-06-15 DIAGNOSIS — R748 Abnormal levels of other serum enzymes: Secondary | ICD-10-CM | POA: Diagnosis not present

## 2017-06-15 DIAGNOSIS — R42 Dizziness and giddiness: Secondary | ICD-10-CM | POA: Insufficient documentation

## 2017-06-15 HISTORY — DX: Lactose intolerance, unspecified: E73.9

## 2017-06-15 HISTORY — DX: Palpitations: R00.2

## 2017-06-15 HISTORY — DX: Enterocolitis due to Clostridium difficile, not specified as recurrent: A04.72

## 2017-06-15 LAB — TSH: TSH: 1.434 u[IU]/mL (ref 0.350–4.500)

## 2017-06-15 LAB — CBC WITH DIFFERENTIAL/PLATELET
Basophils Absolute: 0 10*3/uL (ref 0.0–0.1)
Basophils Relative: 0 %
Eosinophils Absolute: 0.1 10*3/uL (ref 0.0–0.7)
Eosinophils Relative: 1 %
HEMATOCRIT: 45.4 % (ref 36.0–46.0)
Hemoglobin: 15 g/dL (ref 12.0–15.0)
LYMPHS ABS: 2.3 10*3/uL (ref 0.7–4.0)
LYMPHS PCT: 33 %
MCH: 29.9 pg (ref 26.0–34.0)
MCHC: 33 g/dL (ref 30.0–36.0)
MCV: 90.6 fL (ref 78.0–100.0)
MONO ABS: 0.5 10*3/uL (ref 0.1–1.0)
Monocytes Relative: 7 %
Neutro Abs: 4 10*3/uL (ref 1.7–7.7)
Neutrophils Relative %: 59 %
PLATELETS: 209 10*3/uL (ref 150–400)
RBC: 5.01 MIL/uL (ref 3.87–5.11)
RDW: 13.1 % (ref 11.5–15.5)
WBC: 6.9 10*3/uL (ref 4.0–10.5)

## 2017-06-15 LAB — BASIC METABOLIC PANEL
Anion gap: 7 (ref 5–15)
BUN: 23 mg/dL — AB (ref 6–20)
CO2: 29 mmol/L (ref 22–32)
Calcium: 9.1 mg/dL (ref 8.9–10.3)
Chloride: 103 mmol/L (ref 101–111)
Creatinine, Ser: 0.83 mg/dL (ref 0.44–1.00)
GFR calc Af Amer: >60 mL/min (ref >60)
GLUCOSE: 113 mg/dL — AB (ref 65–99)
POTASSIUM: 4.1 mmol/L (ref 3.5–5.1)
Sodium: 139 mmol/L (ref 135–145)

## 2017-06-15 LAB — TROPONIN I
Troponin I: 0.03 ng/mL (ref <0.03)
Troponin I: 0.03 ng/mL (ref <0.03)

## 2017-06-15 LAB — T4, FREE: FREE T4: 1.23 ng/dL — AB (ref 0.61–1.12)

## 2017-06-15 MED ORDER — LABETALOL HCL 5 MG/ML IV SOLN
5.0000 mg | Freq: Once | INTRAVENOUS | Status: AC
  Administered 2017-06-15: 5 mg via INTRAVENOUS
  Filled 2017-06-15: qty 4

## 2017-06-15 MED ORDER — VANCOMYCIN HCL 500 MG IV SOLR
500.0000 mg | INTRAVENOUS | Status: DC
  Filled 2017-06-15: qty 500

## 2017-06-15 MED ORDER — ENOXAPARIN SODIUM 40 MG/0.4ML ~~LOC~~ SOLN
40.0000 mg | SUBCUTANEOUS | Status: DC
  Filled 2017-06-15: qty 0.4

## 2017-06-15 MED ORDER — HYDRALAZINE HCL 20 MG/ML IJ SOLN: 10.0000 mg | Freq: Four times a day (QID) | INTRAMUSCULAR | Status: DC | PRN

## 2017-06-15 MED ORDER — VANCOMYCIN 50 MG/ML ORAL SOLUTION
125.0000 mg | Freq: Once | ORAL | Status: AC
  Administered 2017-06-15: 125 mg via ORAL
  Filled 2017-06-15: qty 2.5

## 2017-06-15 MED ORDER — VANCOMYCIN 50 MG/ML ORAL SOLUTION
125.0000 mg | Freq: Four times a day (QID) | ORAL | Status: DC
  Administered 2017-06-15 – 2017-06-18 (×12): 125 mg via ORAL
  Filled 2017-06-15 (×16): qty 2.5

## 2017-06-15 MED ORDER — ASPIRIN 81 MG PO CHEW
324.0000 mg | CHEWABLE_TABLET | Freq: Once | ORAL | Status: AC
  Administered 2017-06-15: 324 mg via ORAL
  Filled 2017-06-15: qty 4

## 2017-06-15 MED ORDER — ASPIRIN EC 81 MG PO TBEC
81.0000 mg | DELAYED_RELEASE_TABLET | Freq: Every day | ORAL | Status: DC
  Administered 2017-06-16 – 2017-06-18 (×3): 81 mg via ORAL
  Filled 2017-06-15 (×3): qty 1

## 2017-06-15 MED ORDER — METRONIDAZOLE 500 MG PO TABS: 500.0000 mg | ORAL_TABLET | Freq: Once | ORAL | Status: DC

## 2017-06-15 MED ORDER — MECLIZINE HCL 25 MG PO TABS: 25.0000 mg | ORAL_TABLET | Freq: Three times a day (TID) | ORAL | 0 refills | Status: DC | PRN

## 2017-06-15 MED ORDER — SODIUM CHLORIDE 0.9 % IV BOLUS (SEPSIS)
1000.0000 mL | Freq: Once | INTRAVENOUS | Status: AC
  Administered 2017-06-15: 1000 mL via INTRAVENOUS

## 2017-06-15 MED ORDER — ACETAMINOPHEN 325 MG PO TABS: 650.0000 mg | ORAL_TABLET | Freq: Four times a day (QID) | ORAL | Status: DC | PRN

## 2017-06-15 MED ORDER — PROPRANOLOL HCL ER 60 MG PO CP24: 30.0000 mg | ORAL_CAPSULE | Freq: Every day | ORAL | Status: DC

## 2017-06-15 MED ORDER — SODIUM CHLORIDE 0.9 % IV SOLN
INTRAVENOUS | Status: AC
  Administered 2017-06-15: 16:00:00 via INTRAVENOUS

## 2017-06-15 MED ORDER — VANCOMYCIN HCL 125 MG PO CAPS: 125.0000 mg | ORAL_CAPSULE | Freq: Three times a day (TID) | ORAL | 0 refills | Status: DC

## 2017-06-15 MED ORDER — ONDANSETRON HCL 4 MG/2ML IJ SOLN: 4.0000 mg | Freq: Four times a day (QID) | INTRAMUSCULAR | Status: DC | PRN

## 2017-06-15 MED ORDER — ACETAMINOPHEN 650 MG RE SUPP: 650.0000 mg | Freq: Four times a day (QID) | RECTAL | Status: DC | PRN

## 2017-06-15 MED ORDER — ONDANSETRON HCL 4 MG PO TABS: 4.0000 mg | ORAL_TABLET | Freq: Four times a day (QID) | ORAL | Status: DC | PRN

## 2017-06-15 NOTE — ED Provider Notes (Signed)
[facility] DEPT Provider Note   CSN: [id number] Arrival date & time: 06/15/17  0306     History   Chief Complaint Chief Complaint  Patient presents with  . Hypertension    HPI Rhonda Andrews is a 76 y.o. female.  Patient presents with elevated blood pressure. She states her blood pressure home was 235/112. She does not take blood pressure medications. She takes propranolol for palpitations. Patient states she awoke to go to the bathroom and felt lightheaded. She then had an episode of spinning while she was in the bathroom and had to sit on the toilet. She then went back to bed and laid back down and she continued to feel dizzy. That's when she checked her blood pressure and found it to be elevated after checking it multiple times. She endorses mild headache. No focal weakness, numbness or tingling. No difficulty speaking or swallowing. No chest pain or shortness of breath. No visual change. Patient notably saw cardiology earlier this week and was reassured that her palpitations  did not appear to be cardiac in origin. she is scheduled to see ENT later today. She was also told by the cardiologist she should see neurology. She has dealt with dizziness issues for several years but states it's been a long time since she had an episode of vertigo.   The history is provided by the patient.  Hypertension  Pertinent negatives include no chest pain, no abdominal pain and no shortness of breath.    Past Medical History:  Diagnosis Date  . Clostridium difficile diarrhea 02/05/2015  . 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 Heart rate fast     Patient Active Problem List   Diagnosis Date Noted  . Dizziness 06/10/2017  . Bloating 11/24/2013  . 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 GERD (gastroesophageal reflux disease) 11/03/2013  . Diarrhea 04/28/2012  . ANXIETY 05/06/2009  . IBS 05/06/2009  . CHEST PAIN, ATYPICAL 05/06/2009  . PALPITATIONS 04/30/2009    Past Surgical History:    Procedure Laterality Date  . ABDOMINAL HYSTERECTOMY     ovaries remained  . CHOLECYSTECTOMY  3 yrs ago  . lactose intolerant    . Precancerous  Tissue - Nose  February 14-2014    OB History    No data available       Home Medications    Prior to Admission medications   Medication Sig Start Date End Date Taking? Authorizing Provider  cholecalciferol (VITAMIN D) 1000 units tablet Take 1,000 Units by mouth daily.    [provider]  lactobacillus acidophilus (BACID) TABS tablet Take 1 tablet by mouth daily.     [provider]  loperamide (IMODIUM) 2 MG capsule Take by mouth as needed for diarrhea or loose stools.    [provider]  propranolol ER (INDERAL LA) 60 MG 24 hr capsule Take 1 capsule (60 mg total) by mouth daily. Patient taking differently: Take 30 mg by mouth daily with supper.  05/27/13   Arnoldo Lenis, MD    Family History No family history on file.  Social History Social History  Substance Use Topics  . Smoking status: Never Smoker  . Smokeless tobacco: Never Used  . Alcohol use No     Allergies   Prednisone   Review of Systems Review of Systems  Constitutional: Negative for activity change, appetite change, fatigue and fever.  HENT: Negative for congestion and rhinorrhea.   Eyes: Negative for visual disturbance.  Respiratory: Negative for cough, chest tightness and shortness of  breath.   Cardiovascular: Negative for chest pain.  Gastrointestinal: Negative for abdominal pain, nausea and vomiting.  Genitourinary: Negative for dysuria and vaginal discharge.  Musculoskeletal: Negative for arthralgias and myalgias.  Skin: Negative for rash.  Neurological: Positive for dizziness and light-headedness. Negative for weakness.   all other systems are negative except as noted in the HPI and PMH.     Physical Exam Updated Vital Signs BP (!) 155/98   Pulse 84   Temp 98.4 F (36.9 C) (Oral)   Resp (!) 22   Ht 5\' 4"   (1.626 m)   Wt 61.7 kg (136 lb)   SpO2 100%   BMI 23.34 kg/m   Physical Exam  Constitutional: She is oriented to person, place, and time. She appears well-developed and well-nourished. No distress.  HENT:  Head: Normocephalic and atraumatic.  Mouth/Throat: Oropharynx is clear and moist. No oropharyngeal exudate.  Eyes: Pupils are equal, round, and reactive to light. Conjunctivae and EOM are normal.  Neck: Normal range of motion. Neck supple.  No meningismus.  Cardiovascular: Normal rate, regular rhythm, normal heart sounds and intact distal pulses.   No murmur heard. Pulmonary/Chest: Effort normal and breath sounds normal. No respiratory distress.  Abdominal: Soft. There is no tenderness. There is no rebound and no guarding.  Musculoskeletal: Normal range of motion. She exhibits no edema or tenderness.  Neurological: She is alert and oriented to person, place, and time. No cranial nerve deficit. She exhibits normal muscle tone. Coordination normal.  CN 2-12 intact, no ataxia on finger to nose, no nystagmus, 5/5 strength throughout, no pronator drift, Romberg negative, normal gait.  Test of skew negative. Head impulse testing negative.   Skin: Skin is warm.  Psychiatric: She has a normal mood and affect. Her behavior is normal.  Nursing note and vitals reviewed.    ED Treatments / Results  Labs (all labs ordered are listed, but only abnormal results are displayed) Labs Reviewed  BASIC METABOLIC PANEL - Abnormal; Notable for the following:       Result Value   Glucose, Bld 113 (*)    BUN 23 (*)    All other components within normal limits  TROPONIN I - Abnormal; Notable for the following:    Troponin I 0.03 (*)    All other components within normal limits  TROPONIN I - Abnormal; Notable for the following:    Troponin I 0.03 (*)    All other components within normal limits  CBC WITH DIFFERENTIAL/PLATELET    EKG  EKG Interpretation  Date/Time:  Monday June 15 2017  04:05:56 EDT Ventricular Rate:  67 PR Interval:    QRS Duration: 121 QT Interval:  428 QTC Calculation: 452 R Axis:   21 Text Interpretation:  Sinus rhythm Nonspecific intraventricular conduction delay Probable anteroseptal infarct, old Minimal ST depression, diffuse leads Baseline wander in lead(s) V1 No significant change was found Confirmed by Ezequiel Essex [phone]) on 06/15/2017 4:22:46 AM       Radiology No results found.  Procedures Procedures (including critical care time)  Medications Ordered in ED Medications - No data to display   Initial Impression / Assessment and Plan / ED Course  I have reviewed the triage vital signs and the nursing notes.  Pertinent labs & imaging results that were available during my care of the patient were reviewed by me and considered in my medical decision making (see chart for details).    Dizziness with elevated blood pressure, no history of the same.  Neuro exam reassuring.  EKG nonischemic and unchanged.  Doubt central cause of vertigo. No ataxia, no nystagmus. Normal gait and negative romberg.  Troponin minimally elevated. No CP or SOB.  Patient does appear to be anxious.  BP Has improved to [id number] systolic without treatment but is still elevated compared to her baseline.   Troponin will be repeated to assess stability.  Anticipate discharge home with BP monitoring instructions and meclizine if reassuring.   Final Clinical Impressions(s) / ED Diagnoses   Final diagnoses:  Essential hypertension    New Prescriptions New Prescriptions   No medications on file     Ezequiel Essex, MD 06/15/17 [phone]

## 2017-06-15 NOTE — ED Notes (Signed)
Pt ambulated without assistance. No problems noted.

## 2017-06-15 NOTE — ED Notes (Signed)
Meal tray ordered x 2

## 2017-06-15 NOTE — ED Notes (Signed)
Pt given a cup of water to drink for fluid challenge.

## 2017-06-15 NOTE — Discharge Instructions (Signed)
Keep a record of your blood pressure. Follow up with your doctor. Return to the ED if you develop new or worsening symptoms.

## 2017-06-15 NOTE — ED Notes (Signed)
Pt taken to ct 

## 2017-06-15 NOTE — ED Notes (Signed)
Dr tat in with pt

## 2017-06-15 NOTE — ED Triage Notes (Signed)
Pt states she woke up to go to bathroom and didn't feel right so she checked her BP (5-6 times) and got a reading of 235/112. Pt is not on BP meds and states she has never needed them.

## 2017-06-15 NOTE — ED Notes (Signed)
Patient assisted to restroom by wheelchair.

## 2017-06-15 NOTE — ED Provider Notes (Signed)
5:07 PM Assumed care from Dr. Wyvonnia Andrews, please see their note for full history, physical and decision making until this point. In brief this is a 76 y.o. year old female who presented to the ED tonight with Hypertension     Patient woke up dizzy and was thought her cell to be hypertensive. She does not usually hypertensive and no medications for the same and has a history ofchronic dizziness, vertigo palpitations for which she takes propranolol. On arrival here blood pressures have improved significantly but still slightly high.Neurologic exam is appropriate. Workup is appropriate except for she has a mildly positive troponin. She does not be chest pain, shortness of breath, nausea, vomiting or syncope so plan is to get a second troponin to ensure that it is not worsening. EKG looks similar to previous ones.  Second troponin same as the first. On reevaluation patient still has a headache. Has not had a CT done so we'll do that to make sure that the cause of her hypertension.  Patient with an episode of elevated heart rate. Cannot get an EKG during that but rhythm strip appears to be SVT.patient asymptomatic. Discussed patient's case with cardiology, Dr. Domenic Andrews, who recommends echocardiogram, trending troponins, telemetry monitoring for the heart rate and they will see her to help with management. Patient also needs to be on some type of blood pressure medication which will help manage.  Patient she states that she was recently diagnosed C. Difficile as discussed to start her on vancomycin today. We'll start her on the same in the hospital.  Discussed with medicine who will admit.   Labs, studies and imaging reviewed by myself and considered in medical decision making if ordered. Imaging interpreted by radiology.  Labs Reviewed  BASIC METABOLIC PANEL - Abnormal; Notable for the following:       Result Value   Glucose, Bld 113 (*)    BUN 23 (*)    All other components within normal limits   TROPONIN I - Abnormal; Notable for the following:    Troponin I 0.03 (*)    All other components within normal limits  TROPONIN I - Abnormal; Notable for the following:    Troponin I 0.03 (*)    All other components within normal limits  URINE CULTURE  CBC WITH DIFFERENTIAL/PLATELET  TSH  T4, FREE  URINALYSIS, COMPLETE (UACMP) WITH MICROSCOPIC  BASIC METABOLIC PANEL    CT Head Wo Contrast  Final Result      No Follow-up on file.    Rhonda Pew, MD 06/15/17 [phone]

## 2017-06-15 NOTE — Consult Note (Signed)
Cardiology Consultation:   Patient ID: Rhonda Andrews; [id number]; 06/13/1941   Admit date: 06/15/2017 Date of Consult: 06/15/2017  Primary Care Provider: Deloria Lair., MD Primary Cardiologist: Dr. Carlyle Dolly   Patient Profile:   Rhonda Andrews is a 76 y.o. female with a history of palpitations, also recurring diarrhea with diagnoses of Campylobacter and also more recently Clostridium difficile per GI who is being seen today for the evaluation of blood pressure and minor troponin I elevation at the request of Dr. Dayna Barker.  History of Present Illness:   Rhonda Andrews presented to the [facility] overnight stating that she awoke to go the bathroom at 10:30 PM, felt very dizzy and lightheaded, although did not endorse any chest pain or palpitations. She checked her blood pressure stating that it was "220/120, " went back to bed, but got up a few hours later with similar symptoms as well as mild headache and came in for further assessment. Recently, she states that she has been having trouble with dizziness in association with relative hypotension, systolic blood pressures down in the 90s. She was just recently seen in the office by Dr. Bronson Ing on October 10 at which point her blood pressure was normal at 118/82.  It does not appear that she has any specific cardiac diagnosis of arrhythmia, although a long-standing history of palpitations which has been treated with propranolol. She also denies any standing history of essential hypertension.  On evaluation in the ER she has been hypertensive with systolic blood pressures in the 150s to 160s, not orthostatic on evaluation. Telemetry shows sinus rhythm with a limited episode of SVT, possibly reentrant tachycardia although ECG not obtained at the time.  Lab work obtained in the ER included troponin I levels which were minimally increased 0.03 and flat pattern not suggestive of ACS. She has not reported any chest pain.ECG shows  sinus rhythm with nonspecific ST changes.  Head CT reported no acute process with age-related atrophy and chronic small vessel disease.  Past Medical History:  Diagnosis Date  . Clostridium difficile diarrhea 02/05/2015  . 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 Lactose intolerance   . Palpitations     Past Surgical History:  Procedure Laterality Date  . ABDOMINAL HYSTERECTOMY     ovaries remained  . CHOLECYSTECTOMY  3 yrs ago  . Precancerous  Tissue - Nose  February 14-2014     Outpatient Medications: No current facility-administered medications on file prior to encounter.    Current Outpatient Prescriptions on File Prior to Encounter  Medication Sig Dispense Refill  . cholecalciferol (VITAMIN D) 1000 units tablet Take 1,000 Units by mouth daily.    Marland Kitchen lactobacillus acidophilus (BACID) TABS tablet Take 1 tablet by mouth daily.     Marland Kitchen loperamide (IMODIUM) 2 MG capsule Take by mouth as needed for diarrhea or loose stools.    . propranolol ER (INDERAL LA) 60 MG 24 hr capsule Take 1 capsule (60 mg total) by mouth daily. (Patient taking differently: Take 30 mg by mouth daily with supper. ) 30 capsule 6    Allergies:    Allergies  Allergen Reactions  . Prednisone Other (See Comments)    Heart races    Social History:   Social History   Social History  . Marital status: Married    Spouse name: N/A  . Number of children: N/A  . Years of education: N/A   Occupational History  . Not on file.   Social History Main Topics  .  Smoking status: Never Smoker  . Smokeless tobacco: Never Used  . Alcohol use No  . Drug use: No  . Sexual activity: Not on file   Other Topics Concern  . Not on file   Social History Narrative  . No narrative on file    Family History:   The patient's family history includes Colon cancer in her mother; Lymphoma in her mother.  ROS:  Please see the history of present illness.  Chronic trouble with loose stools. No recent fevers or chills. No chest pain,  orthopnea, PND. No syncope.All other ROS reviewed and negative.     Physical Exam/Data:   Vitals:   06/15/17 0945 06/15/17 1000 06/15/17 1100 06/15/17 1130  BP:  (!) 166/74 (!) 157/84 (!) 159/89  Pulse: 91 78 76 79  Resp: 20 (!) 23 (!) 24 18  Temp:      TempSrc:      SpO2: (!) 73% 96% 98% 97%  Weight:      Height:        Intake/Output Summary (Last 24 hours) at 06/15/17 1153 Last data filed at 06/15/17 1042  Gross per 24 hour  Intake             1000 ml  Output                0 ml  Net             1000 ml   Filed Weights   06/15/17 0332  Weight: 136 lb (61.7 kg)   Body mass index is 23.34 kg/m.   Gen: Thin woman, appears comfortable at rest. HEENT: Conjunctiva and lids normal, oropharynx clear. Neck: Supple, no elevated JVP or carotid bruits, no thyromegaly. Lungs: Clear to auscultation, nonlabored breathing at rest. Cardiac: Regular rate and rhythm, no S3 or significant systolic murmur, no pericardial rub. Abdomen: Soft, nontender, bowel sounds present, no guarding or rebound. Extremities: No pitting edema, distal pulses 2+. Skin: Warm and dry. Musculoskeletal: No kyphosis. Neuropsychiatric: Alert and oriented x3, affect grossly appropriate.  EKG:  I personally reviewed the tracing from 06/15/2017 which shows sinus rhythm with nonspecific ST-T changes.  Telemetry:  I personally reviewed telemetry which shows sinus rhythm.  Laboratory Data:  Chemistry Recent Labs Lab 06/15/17 0512  NA 139  K 4.1  CL 103  CO2 29  GLUCOSE 113*  BUN 23*  CREATININE 0.83  CALCIUM 9.1  GFRNONAA >60  GFRAA >60  ANIONGAP 7    Hematology Recent Labs Lab 06/15/17 0512  WBC 6.9  RBC 5.01  HGB 15.0  HCT 45.4  MCV 90.6  MCH 29.9  MCHC 33.0  RDW 13.1  PLT 209   Cardiac Enzymes Recent Labs Lab 06/15/17 0512 06/15/17 0858  TROPONINI 0.03* 0.03*   No results for input(s): TROPIPOC in the last 168 hours.   Radiology/Studies:  Ct Head Wo Contrast  Result Date:  06/15/2017 CLINICAL DATA:  Elevated blood pressure, did not feel RIGHT, suspected subarachnoid hemorrhage EXAM: CT HEAD WITHOUT CONTRAST TECHNIQUE: Contiguous axial images were obtained from the base of the skull through the vertex without intravenous contrast. Sagittal and coronal MPR images reconstructed from axial data set. COMPARISON:  None FINDINGS: Brain: Beam hardening artifacts from skullbase traversed pons. Generalized age-related atrophy. Normal ventricular morphology. No midline shift or mass effect. Small vessel chronic ischemic changes of deep cerebral white matter. No intracranial hemorrhage, mass lesion, evidence of acute infarction, or extra-axial fluid collection. Ossification within anterior falx. Vascular: Normal appearance Skull:  Intact Sinuses/Orbits: Clear Other: N/A IMPRESSION: Age-related atrophy with mild small vessel chronic ischemic changes of deep cerebral white matter. No acute intracranial abnormalities. Electronically Signed   By: Lavonia Dana M.D.   On: 06/15/2017 10:55    Assessment and Plan:   1. Presentation with reported dizziness and finding of significant blood pressure elevation by home check, moderately hypertensive on ER assessment and not orthostatic. She has a history of dizziness more association with relative hypotension. Etiology is not entirely clear at this point.  2. Long-standing history of palpitations without documented arrhythmia. Limited telemetry strip in ER shows SVT, rule out reentrant mechanism, although ECG not obtained at the time. She has been on propranolol an outpatient.  3. History of intermittent loose stools, followed by GI with diagnoses of Campylobacter and more recently Clostridium difficile. She also has lactose intolerance.  4. Minor troponin I elevation at 0.03 in the absence of chest pain. Not consistent with ACS at this point. More likely related to myocardial strain with elevated blood pressure or potentially SVT.  Discussed with  Dr. Dayna Barker who is contacting the hospitalist service for admission. I would recommend keeping Ms. Jobst on telemetry to assess for any recurring arrhythmia, cycle cardiac markers although doubt ACS at this time, and check an echocardiogram to assess cardiac structure and function. Etiology of her blood pressure fluctuation is not certain. CT scan from 2011 reported normal adrenals. Although rare, pheochromocytoma would be worth evaluating, at least by obtaining 24-hour urine for metanephrines and normetanephrines. We will otherwise continue her propranolol and watch blood pressure trend.   Signed, Rozann Lesches, MD  06/15/2017 11:53 AM

## 2017-06-15 NOTE — Telephone Encounter (Signed)
Rx for Vancomycin sent to her pharmacy 

## 2017-06-15 NOTE — Telephone Encounter (Signed)
I have spoken with patient 

## 2017-06-15 NOTE — H&P (Addendum)
History and Physical  BARB SHEAR GQQ:[id number] DOB: October 05, 1940 DOA: 06/15/2017   PCP: Deloria Lair., MD   Patient coming from: Home  Chief Complaint: dizziness  HPI:  Rhonda Andrews is a 76 y.o. female with medical history of chronic dizziness, chronic diarrhea, and palpitations presenting with acute worsening of dizziness when she woke up in the early morning of 06/15/2017. The patient took her blood pressure and noted it to be 235/112. The patient states that she has never had a diagnosis of hypertension. Review of her medical records over the last 3-4 months of office visits supports the patient's statement. In fact, the patient states that her systolic blood pressure runs in the 90-100 range at home. The patient states that she has felt lightheaded for the past 2 weeks. She states that this is usually precipitated by positional changes from changing from a recumbent position to standing position. She denies any syncope. She denies any fevers, chills, visual disturbance, focal weakness, dysarthria, dysphasia. There is no dysuria, hematuria, chest pain, shortness of breath. She has intermittent headaches. As a result of her dizziness and elevated blood pressure, the patient presents emergency department for further evaluation.  Notably, the patient states that she takes propranolol 30 mg daily for her palpitations. After a few days of not taking the propranolol last week, the patient restarted it on 06/12/2017.  In emergency primary, the patient was afebrile and hemodynamically stable saturating 99% on room air. Her initial blood pressure was 181/85. During her stay in the emergency department, the patient had an episode of SVT lasting approximately 1 minute. She was asymptomatic. Cardiology was consulted and recommended observation overnight.  Assessment/Plan: Dizziness/vertigo -suspect there may be a degree of volume depletion as the patient has newly diagnosed C. Difficile  colitis with diarrhea -check orthostatics -received 1 L in the emergency department -Continue IV fluids -UA and urine culture -B12 -CT brain negative -?complicated migraine  C. Difficile colitis -06/04/2017 C. Difficile toxin positive -Start vancomycin po  Elevated troponin -Likely demand ischemia -No chest pain -Echocardiogram -TSH -EKG--sinus rhythm, nonspecific T-wave changes, IVCD  Elevated blood pressure -Patient endorses history of white coat hypertension -Monitor blood pressures overnight -continue home dose propranolol for her palpitations -hydralazine prn SBP >180       Past Medical History:  Diagnosis Date  . Clostridium difficile diarrhea 02/05/2015  . 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 Lactose intolerance   . Palpitations    Past Surgical History:  Procedure Laterality Date  . ABDOMINAL HYSTERECTOMY     ovaries remained  . CHOLECYSTECTOMY  3 yrs ago  . Precancerous  Tissue - Nose  February 14-2014   Social History:  reports that she has never smoked. She has never used smokeless tobacco. She reports that she does not drink alcohol or use drugs.   Family History  Problem Relation Age of Onset  . Lymphoma Mother   . Colon cancer Mother      Allergies  Allergen Reactions  . Prednisone Other (See Comments)    Heart races     Prior to Admission medications   Medication Sig Start Date End Date Taking? Authorizing Provider  cholecalciferol (VITAMIN D) 1000 units tablet Take 1,000 Units by mouth daily.   Yes [provider]  lactobacillus acidophilus (BACID) TABS tablet Take 1 tablet by mouth daily.    Yes [provider]  loperamide (IMODIUM) 2 MG capsule Take by mouth as needed for diarrhea or loose stools.  Yes [provider]  propranolol ER (INDERAL LA) 60 MG 24 hr capsule Take 1 capsule (60 mg total) by mouth daily. Patient taking differently: Take 30 mg by mouth daily with supper.  05/27/13  Yes BranchAlphonse Guild, MD    vancomycin (VANCOCIN HCL) 125 MG capsule Take 1 capsule (125 mg total) by mouth 3 (three) times daily. 06/15/17  Yes Setzer, Rona Ravens, NP  meclizine (ANTIVERT) 25 MG tablet Take 1 tablet (25 mg total) by mouth 3 (three) times daily as needed for dizziness. 06/15/17   Rancour, Annie Main, MD    Review of Systems:  Constitutional:  No weight loss, night sweats, Fevers, chills, fatigue.  Head&Eyes: No headache.  No vision loss.  No eye pain or scotoma ENT:  No Difficulty swallowing,Tooth/dental problems,Sore throat,  No ear ache, post nasal drip,  Cardio-vascular:  No chest pain, Orthopnea, PND, swelling in lower extremities,  dizziness, palpitations  GI:  No  abdominal pain, nausea, vomiting, diarrhea, loss of appetite, hematochezia, melena, heartburn, indigestion, Resp:  No shortness of breath with exertion or at rest. No cough. No coughing up of blood .No wheezing.No chest wall deformity  Skin:  no rash or lesions.  GU:  no dysuria, change in color of urine, no urgency or frequency. No flank pain.  Musculoskeletal:  No joint pain or swelling. No decreased range of motion. No back pain.  Psych:  No change in mood or affect. No depression or anxiety. Neurologic: No headache, no dysesthesia, no focal weakness, no vision loss. No syncope  Physical Exam: Vitals:   06/15/17 1200 06/15/17 1230 06/15/17 1245 06/15/17 1300  BP: (!) 156/80 (!) 154/74  (!) 147/73  Pulse: 75 97 (!) 45 68  Resp: 20 (!) 24 (!) 25 (!) 23  Temp:      TempSrc:      SpO2: 99% (!) 74% (!) 74% 98%  Weight:      Height:       General:  A&O x 3, NAD, nontoxic, pleasant/cooperative Head/Eye: No conjunctival hemorrhage, no icterus, [place]/AT, No nystagmus ENT:  No icterus,  No thrush, good dentition, no pharyngeal exudate Neck:  No masses, no lymphadenpathy, no bruits CV:  RRR, no rub, no gallop, no S3 Lung:  CTAB, good air movement, no wheeze, no rhonchi Abdomen: soft/NT, +BS, nondistended, no peritoneal  signs Ext: No cyanosis, No rashes, No petechiae, No lymphangitis, No edema Neuro: CNII-XII intact, strength 4/5 in bilateral upper and lower extremities, no dysmetria  Labs on Admission:  Basic Metabolic Panel:  Recent Labs Lab 06/15/17 0512  NA 139  K 4.1  CL 103  CO2 29  GLUCOSE 113*  BUN 23*  CREATININE 0.83  CALCIUM 9.1   Liver Function Tests: No results for input(s): AST, ALT, ALKPHOS, BILITOT, PROT, ALBUMIN in the last 168 hours. No results for input(s): LIPASE, AMYLASE in the last 168 hours. No results for input(s): AMMONIA in the last 168 hours. CBC:  Recent Labs Lab 06/15/17 0512  WBC 6.9  NEUTROABS 4.0  HGB 15.0  HCT 45.4  MCV 90.6  PLT 209   Coagulation Profile: No results for input(s): INR, PROTIME in the last 168 hours. Cardiac Enzymes:  Recent Labs Lab 06/15/17 0512 06/15/17 0858  TROPONINI 0.03* 0.03*   BNP: Invalid input(s): POCBNP CBG: No results for input(s): GLUCAP in the last 168 hours. Urine analysis:    Component Value Date/Time   COLORURINE YELLOW 03/19/2015 1109   APPEARANCEUR CLEAR 03/19/2015 1109   LABSPEC 1.009 03/19/2015  [place] 6.0 03/19/2015 1109   GLUCOSEU NEG 03/19/2015 1109   HGBUR NEG 03/19/2015 1109   BILIRUBINUR NEG 03/19/2015 1109   KETONESUR NEG 03/19/2015 1109   PROTEINUR NEG 03/19/2015 1109   UROBILINOGEN 0.2 03/19/2015 1109   NITRITE NEG 03/19/2015 1109   LEUKOCYTESUR SMALL (A) 03/19/2015 1109   Sepsis Labs: @LABRCNTIP (procalcitonin:4,lacticidven:4) )No results found for this or any previous visit (from the past 240 hour(s)).   Radiological Exams on Admission: Ct Head Wo Contrast  Result Date: 06/15/2017 CLINICAL DATA:  Elevated blood pressure, did not feel RIGHT, suspected subarachnoid hemorrhage EXAM: CT HEAD WITHOUT CONTRAST TECHNIQUE: Contiguous axial images were obtained from the base of the skull through the vertex without intravenous contrast. Sagittal and coronal MPR images reconstructed  from axial data set. COMPARISON:  None FINDINGS: Brain: Beam hardening artifacts from skullbase traversed pons. Generalized age-related atrophy. Normal ventricular morphology. No midline shift or mass effect. Small vessel chronic ischemic changes of deep cerebral white matter. No intracranial hemorrhage, mass lesion, evidence of acute infarction, or extra-axial fluid collection. Ossification within anterior falx. Vascular: Normal appearance Skull: Intact Sinuses/Orbits: Clear Other: N/A IMPRESSION: Age-related atrophy with mild small vessel chronic ischemic changes of deep cerebral white matter. No acute intracranial abnormalities. Electronically Signed   By: Lavonia Dana M.D.   On: 06/15/2017 10:55    EKG: Independently reviewed. Sinus, nonspecific T wave changes, IVCD    Time spent:60 minutes Code Status:   FULL Family Communication:  No Family at bedside Disposition Plan: expect 1 day hospitalization Consults called: cardiology DVT Prophylaxis: [place] Lovenox  Statia Burdick, DO  [facility] Pager [phone]  If 7PM-7AM, please contact night-coverage [email or web address] Password [email or web address] 06/15/2017, 1:33 PM

## 2017-06-15 NOTE — ED Notes (Signed)
CRITICAL VALUE ALERT  Critical Value:  Troponin 0.03 Date & Time Notied: 06/15/17@0629  Provider Notified: Rancour Orders Received/Actions taken: None yet

## 2017-06-16 ENCOUNTER — Inpatient Hospital Stay (HOSPITAL_COMMUNITY): Payer: Medicare Other

## 2017-06-16 ENCOUNTER — Observation Stay (HOSPITAL_COMMUNITY): Payer: Medicare Other

## 2017-06-16 DIAGNOSIS — R748 Abnormal levels of other serum enzymes: Secondary | ICD-10-CM | POA: Diagnosis not present

## 2017-06-16 DIAGNOSIS — Z90710 Acquired absence of both cervix and uterus: Secondary | ICD-10-CM | POA: Diagnosis not present

## 2017-06-16 DIAGNOSIS — Z888 Allergy status to other drugs, medicaments and biological substances status: Secondary | ICD-10-CM | POA: Diagnosis not present

## 2017-06-16 DIAGNOSIS — R51 Headache: Secondary | ICD-10-CM | POA: Diagnosis not present

## 2017-06-16 DIAGNOSIS — Z800 Family history of malignant neoplasm of digestive organs: Secondary | ICD-10-CM | POA: Diagnosis not present

## 2017-06-16 DIAGNOSIS — Z9049 Acquired absence of other specified parts of digestive tract: Secondary | ICD-10-CM | POA: Diagnosis not present

## 2017-06-16 DIAGNOSIS — Z807 Family history of other malignant neoplasms of lymphoid, hematopoietic and related tissues: Secondary | ICD-10-CM | POA: Diagnosis not present

## 2017-06-16 DIAGNOSIS — I10 Essential (primary) hypertension: Secondary | ICD-10-CM | POA: Diagnosis not present

## 2017-06-16 DIAGNOSIS — Z79899 Other long term (current) drug therapy: Secondary | ICD-10-CM | POA: Diagnosis not present

## 2017-06-16 DIAGNOSIS — I951 Orthostatic hypotension: Secondary | ICD-10-CM | POA: Diagnosis present

## 2017-06-16 DIAGNOSIS — I471 Supraventricular tachycardia: Secondary | ICD-10-CM | POA: Diagnosis not present

## 2017-06-16 DIAGNOSIS — R42 Dizziness and giddiness: Secondary | ICD-10-CM | POA: Diagnosis not present

## 2017-06-16 DIAGNOSIS — N390 Urinary tract infection, site not specified: Secondary | ICD-10-CM | POA: Diagnosis present

## 2017-06-16 DIAGNOSIS — R030 Elevated blood-pressure reading, without diagnosis of hypertension: Secondary | ICD-10-CM | POA: Diagnosis not present

## 2017-06-16 DIAGNOSIS — I248 Other forms of acute ischemic heart disease: Secondary | ICD-10-CM | POA: Diagnosis present

## 2017-06-16 DIAGNOSIS — R079 Chest pain, unspecified: Secondary | ICD-10-CM | POA: Diagnosis not present

## 2017-06-16 DIAGNOSIS — A0472 Enterocolitis due to Clostridium difficile, not specified as recurrent: Secondary | ICD-10-CM | POA: Diagnosis not present

## 2017-06-16 LAB — BASIC METABOLIC PANEL
Anion gap: 8 (ref 5–15)
BUN: 13 mg/dL (ref 6–20)
CALCIUM: 8.4 mg/dL — AB (ref 8.9–10.3)
CO2: 28 mmol/L (ref 22–32)
CREATININE: 0.77 mg/dL (ref 0.44–1.00)
Chloride: 104 mmol/L (ref 101–111)
GFR calc non Af Amer: >60 mL/min (ref >60)
Glucose, Bld: 103 mg/dL — ABNORMAL HIGH (ref 65–99)
Potassium: 3.6 mmol/L (ref 3.5–5.1)
SODIUM: 140 mmol/L (ref 135–145)

## 2017-06-16 LAB — URINALYSIS, COMPLETE (UACMP) WITH MICROSCOPIC
BILIRUBIN URINE: NEGATIVE
Glucose, UA: NEGATIVE mg/dL
KETONES UR: NEGATIVE mg/dL
Nitrite: NEGATIVE
PROTEIN: NEGATIVE mg/dL
Specific Gravity, Urine: 1.016 (ref 1.005–1.030)
pH: 5 (ref 5.0–8.0)

## 2017-06-16 LAB — MAGNESIUM: MAGNESIUM: 1.7 mg/dL (ref 1.7–2.4)

## 2017-06-16 LAB — ECHOCARDIOGRAM COMPLETE
Height: 64 in
Weight: 2176 oz

## 2017-06-16 MED ORDER — POTASSIUM CHLORIDE IN NACL 20-0.9 MEQ/L-% IV SOLN
INTRAVENOUS | Status: AC
  Administered 2017-06-16: 13:00:00 via INTRAVENOUS

## 2017-06-16 MED ORDER — DEXTROSE 5 % IV SOLN
1.0000 g | INTRAVENOUS | Status: DC
  Administered 2017-06-16 – 2017-06-18 (×3): 1 g via INTRAVENOUS
  Filled 2017-06-16 (×4): qty 10

## 2017-06-16 NOTE — Progress Notes (Signed)
Progress Note  Patient Name: Rhonda Andrews Date of Encounter: 06/16/2017  Primary Cardiologist: Dr. Carlyle Dolly  Subjective   No chest pain or palpitations. Feeling of "fullness" in her head and ears. No vertigo.  Inpatient Medications    Scheduled Meds: . aspirin EC  81 mg Oral Daily  . enoxaparin (LOVENOX) injection  40 mg Subcutaneous Q24H  . propranolol ER  60 mg Oral Q supper  . vancomycin  125 mg Oral Q6H   PRN Meds: acetaminophen **OR** acetaminophen, hydrALAZINE, ondansetron **OR** ondansetron (ZOFRAN) IV   Vital Signs    Vitals:   06/15/17 1520 06/15/17 1523 06/15/17 2154 06/16/17 0557  BP:  (!) 142/74 137/66 (!) 158/70  Pulse:  96 73 66  Resp:  19 16 18   Temp:  97.6 F (36.4 C) 98 F (36.7 C) 98 F (36.7 C)  TempSrc:  Oral Oral Oral  SpO2:  100% 98% 97%  Weight: 136 lb (61.7 kg)     Height: 5\' 4"  (1.626 m)       Intake/Output Summary (Last 24 hours) at 06/16/17 1010 Last data filed at 06/16/17 0314  Gross per 24 hour  Intake          2078.75 ml  Output              400 ml  Net          1678.75 ml   Filed Weights   06/15/17 0332 06/15/17 1520  Weight: 136 lb (61.7 kg) 136 lb (61.7 kg)    Telemetry    Sinus rhythm. Personally reviewed.  Physical Exam   GEN: No acute distress.   Neck: No JVD. Cardiac: RRR, no murmur, rub, or gallop.  Respiratory: Nonlabored. Clear to auscultation bilaterally. GI: Soft, nontender, bowel sounds present. MS: No edema; No deformity. Neuro:  Nonfocal. Psych: Alert and oriented x 3. Normal affect.  Labs    Chemistry  Recent Labs Lab 06/15/17 0512 06/16/17 0656  NA 139 140  K 4.1 3.6  CL 103 104  CO2 29 28  GLUCOSE 113* 103*  BUN 23* 13  CREATININE 0.83 0.77  CALCIUM 9.1 8.4*  GFRNONAA >60 >60  GFRAA >60 >60  ANIONGAP 7 8     Hematology  Recent Labs Lab 06/15/17 0512  WBC 6.9  RBC 5.01  HGB 15.0  HCT 45.4  MCV 90.6  MCH 29.9  MCHC 33.0  RDW 13.1  PLT 209    Cardiac  Enzymes  Recent Labs Lab 06/15/17 0512 06/15/17 0858  TROPONINI 0.03* 0.03*   No results for input(s): TROPIPOC in the last 168 hours.   Radiology    Ct Head Wo Contrast  Result Date: 06/15/2017 CLINICAL DATA:  Elevated blood pressure, did not feel RIGHT, suspected subarachnoid hemorrhage EXAM: CT HEAD WITHOUT CONTRAST TECHNIQUE: Contiguous axial images were obtained from the base of the skull through the vertex without intravenous contrast. Sagittal and coronal MPR images reconstructed from axial data set. COMPARISON:  None FINDINGS: Brain: Beam hardening artifacts from skullbase traversed pons. Generalized age-related atrophy. Normal ventricular morphology. No midline shift or mass effect. Small vessel chronic ischemic changes of deep cerebral white matter. No intracranial hemorrhage, mass lesion, evidence of acute infarction, or extra-axial fluid collection. Ossification within anterior falx. Vascular: Normal appearance Skull: Intact Sinuses/Orbits: Clear Other: N/A IMPRESSION: Age-related atrophy with mild small vessel chronic ischemic changes of deep cerebral white matter. No acute intracranial abnormalities. Electronically Signed   By: Crist Infante.D.  On: 06/15/2017 10:55    Cardiac Studies   Echocardiogram pending.  Patient Profile     76 y.o. female with a history of palpitations, blood pressure fluctuation, now presenting with feeling of dizziness and documented hypertension over her baseline. She had a brief episode of SVT noted in the ER, but no subsequent arrhythmias by telemetry monitoring. Minor troponin elevation of 0.03 is not consistent with ACS, and she has not had any recent chest pain.  Assessment & Plan    1. Brief episode of SVT seen in the ER, no recurrence by telemetry. She continues on propranolol.  2. Description of dizziness, no recent hypotension and in fact she has been hypertensive with systolics in the 573 to 220 range.  3. History of intermittent  diarrhea, followed by GI with diagnosis of Campylobacter and recently Clostridium difficile. She is on contact precautions.  Echocardiogram pending. Presuming no major structural abnormalities, no further inpatient cardiac testing is planned. Would recommend continuing propranolol. Would hold off on adding any other antihypertensives in light of fluctuating blood pressure. May want to consider doing a brain MRI given her vague complaints of headache and fullness even though CT was unrevealing. As mentioned in the consultation note, would recommend a 24-hour urine collection for metanephrines and normetanephrines. She may be able to be discharged later today for outpatient follow-up with Dr. Harl Bowie.   Signed, Rozann Lesches, MD  06/16/2017, 10:10 AM

## 2017-06-16 NOTE — Care Management Obs Status (Signed)
[place] NOTIFICATION   Patient Details  Name: SHERAH LUND MRN: [id number] Date of Birth: [birth year]   Medicare Observation Status Notification Given:  Yes    Urias Sheek, Chauncey Reading, RN 06/16/2017, 10:44 AM

## 2017-06-16 NOTE — Progress Notes (Signed)
PROGRESS NOTE  Rhonda BLACKSHER VHQ:[id number] DOB: [birth year] DOA: 06/15/2017 PCP: Deloria Lair., MD  Brief History:  76 y.o. female with medical history of chronic dizziness, chronic diarrhea, and palpitations presenting with acute worsening of dizziness when she woke up in the early morning of 06/15/2017. The patient took her blood pressure and noted it to be 235/112. The patient states that she has never had a diagnosis of hypertension. Review of her medical records over the last 3-4 months of office visits supports the patient's statement. In fact, the patient states that her systolic blood pressure runs in the 90-100 range at home. The patient states that she has felt lightheaded for the past 3-4. She describes it as "head in a cup" sensation which has been worse in past 2 weeks.  She states that this is usually precipitated by positional changes from changing from a recumbent position to standing position. She denies any syncope. She denies any fevers, chills, visual disturbance, focal weakness, dysarthria, dysphasia. There is no dysuria, hematuria, chest pain, shortness of breath. She has intermittent headaches. As a result of her dizziness and elevated blood pressure, the patient presents emergency department for further evaluation.  Notably, the patient states that she takes propranolol 30 mg daily for her palpitations. After a few days of not taking the propranolol last week, the patient restarted it on 06/12/2017.   Assessment/Plan: Dizziness/vertigo -multifactorial but suspect there may be a degree of volume depletion as the patient has newly diagnosed C. Difficile colitis with diarrhea -orthostatic hypotension, UTI also contributing -check orthostatics--positive--BP dropped 178/83 to 159/90 -received 1 L in the emergency department -Continue IV fluids -UA--TNTC WBC -B12 -CT brain negative -?complicated migraine vs aura without headpain -very low clinical suspicion  of stroke with no focal neuro deficits on exam but will order MR brain to r/o other intracranial pathology   C. Difficile colitis -06/04/2017 C. Difficile toxin positive -Continue vancomycin po -diarrhea improved--only one BM since admission  UTI -TNTC WBC on UA -ceftriaxone pending culture data  Orthostatic hypotension -positive orthostatic vitals -IVF  Elevated troponin -Likely demand ischemia -No chest pain -Echocardiogram -TSH--1.434 -EKG--sinus rhythm, nonspecific T-wave changes, IVCD  Elevated blood pressure -Patient endorses history of white coat hypertension -Monitor blood pressures--overall stable since admission -hold propranolol while workup for pheo -hydralazine prn SBP >180 -check 24 hour urine for VMA, epi, metanephrines -check plasma metanephrines   Disposition Plan:   Home 10/17 if stable Family Communication:   Spouse updated at bedside 06/16/17--Total time spent 35 minutes.  Greater than 50% spent face to face counseling and coordinating care.   Consultants:  cardiology  Code Status:  FULL  DVT Prophylaxis:   [place] Lovenox   Procedures: As Listed in Progress Note Above  Antibiotics: None    Subjective: She continues to describe a head fullness type sensation without any visual disturbance, dysarthria, dysphasia, focal weakness, fevers, chills, other sensory disturbances. She denies any fevers, chills, chest pain, shortness breath, nausea, vomiting, diarrhea, abdominal pain. No dysuria or hematuria.  Objective: Vitals:   06/15/17 1520 06/15/17 1523 06/15/17 2154 06/16/17 0557  BP:  (!) 142/74 137/66 (!) 158/70  Pulse:  96 73 66  Resp:  19 16 18   Temp:  97.6 F (36.4 C) 98 F (36.7 C) 98 F (36.7 C)  TempSrc:  Oral Oral Oral  SpO2:  100% 98% 97%  Weight: 61.7 kg (136 lb)     Height: 5\' 4"  (1.626  m)       Intake/Output Summary (Last 24 hours) at 06/16/17 1034 Last data filed at 06/16/17 0314  Gross per 24 hour  Intake           2078.75 ml  Output              400 ml  Net          1678.75 ml   Weight change: 0 kg (0 lb) Exam:   General:  Pt is alert, follows commands appropriately, not in acute distress  HEENT: No icterus, No thrush, No neck mass, [place]/AT  Cardiovascular: RRR, S1/S2, no rubs, no gallops  Respiratory: CTA bilaterally, no wheezing, no crackles, no rhonchi  Abdomen: Soft/+BS, non tender, non distended, no guarding  Extremities: No edema, No lymphangitis, No petechiae, No rashes, no synovitis  Neuro:  CN II-XII intact, strength 4/5 in RUE, RLE, strength 4/5 LUE, LLE; sensation intact bilateral; no dysmetria; babinski equivocal    Data Reviewed: I have personally reviewed following labs and imaging studies Basic Metabolic Panel:  Recent Labs Lab 06/15/17 0512 06/16/17 0656  NA 139 140  K 4.1 3.6  CL 103 104  CO2 29 28  GLUCOSE 113* 103*  BUN 23* 13  CREATININE 0.83 0.77  CALCIUM 9.1 8.4*   Liver Function Tests: No results for input(s): AST, ALT, ALKPHOS, BILITOT, PROT, ALBUMIN in the last 168 hours. No results for input(s): LIPASE, AMYLASE in the last 168 hours. No results for input(s): AMMONIA in the last 168 hours. Coagulation Profile: No results for input(s): INR, PROTIME in the last 168 hours. CBC:  Recent Labs Lab 06/15/17 0512  WBC 6.9  NEUTROABS 4.0  HGB 15.0  HCT 45.4  MCV 90.6  PLT 209   Cardiac Enzymes:  Recent Labs Lab 06/15/17 0512 06/15/17 0858  TROPONINI 0.03* 0.03*   BNP: Invalid input(s): POCBNP CBG: No results for input(s): GLUCAP in the last 168 hours. HbA1C: No results for input(s): HGBA1C in the last 72 hours. Urine analysis:    Component Value Date/Time   COLORURINE YELLOW 06/16/2017 0005   APPEARANCEUR HAZY (A) 06/16/2017 0005   LABSPEC 1.016 06/16/2017 0005   PHURINE 5.0 06/16/2017 0005   GLUCOSEU NEGATIVE 06/16/2017 0005   HGBUR SMALL (A) 06/16/2017 0005   BILIRUBINUR NEGATIVE 06/16/2017 0005   KETONESUR NEGATIVE 06/16/2017  0005   PROTEINUR NEGATIVE 06/16/2017 0005   UROBILINOGEN 0.2 03/19/2015 1109   NITRITE NEGATIVE 06/16/2017 0005   LEUKOCYTESUR LARGE (A) 06/16/2017 0005   Sepsis Labs: @LABRCNTIP (procalcitonin:4,lacticidven:4) )No results found for this or any previous visit (from the past 240 hour(s)).   Scheduled Meds: . aspirin EC  81 mg Oral Daily  . enoxaparin (LOVENOX) injection  40 mg Subcutaneous Q24H  . propranolol ER  60 mg Oral Q supper  . vancomycin  125 mg Oral Q6H   Continuous Infusions:  Procedures/Studies: Ct Head Wo Contrast  Result Date: 06/15/2017 CLINICAL DATA:  Elevated blood pressure, did not feel RIGHT, suspected subarachnoid hemorrhage EXAM: CT HEAD WITHOUT CONTRAST TECHNIQUE: Contiguous axial images were obtained from the base of the skull through the vertex without intravenous contrast. Sagittal and coronal MPR images reconstructed from axial data set. COMPARISON:  None FINDINGS: Brain: Beam hardening artifacts from skullbase traversed pons. Generalized age-related atrophy. Normal ventricular morphology. No midline shift or mass effect. Small vessel chronic ischemic changes of deep cerebral white matter. No intracranial hemorrhage, mass lesion, evidence of acute infarction, or extra-axial fluid collection. Ossification within anterior falx. Vascular: Normal  appearance Skull: Intact Sinuses/Orbits: Clear Other: N/A IMPRESSION: Age-related atrophy with mild small vessel chronic ischemic changes of deep cerebral white matter. No acute intracranial abnormalities. Electronically Signed   By: Lavonia Dana M.D.   On: 06/15/2017 10:55    Zuri Bradway, DO  [facility] Pager [phone]  If 7PM-7AM, please contact night-coverage [email or web address] Password [email or web address] 06/16/2017, 10:34 AM   LOS: 0 days

## 2017-06-17 DIAGNOSIS — I10 Essential (primary) hypertension: Secondary | ICD-10-CM

## 2017-06-17 DIAGNOSIS — R51 Headache: Secondary | ICD-10-CM

## 2017-06-17 DIAGNOSIS — R42 Dizziness and giddiness: Secondary | ICD-10-CM

## 2017-06-17 DIAGNOSIS — R748 Abnormal levels of other serum enzymes: Secondary | ICD-10-CM

## 2017-06-17 DIAGNOSIS — A0472 Enterocolitis due to Clostridium difficile, not specified as recurrent: Principal | ICD-10-CM

## 2017-06-17 LAB — BASIC METABOLIC PANEL
Anion gap: 7 (ref 5–15)
BUN: 15 mg/dL (ref 6–20)
CALCIUM: 8.5 mg/dL — AB (ref 8.9–10.3)
CO2: 27 mmol/L (ref 22–32)
CREATININE: 0.71 mg/dL (ref 0.44–1.00)
Chloride: 104 mmol/L (ref 101–111)
Glucose, Bld: 108 mg/dL — ABNORMAL HIGH (ref 65–99)
Potassium: 3.5 mmol/L (ref 3.5–5.1)
SODIUM: 138 mmol/L (ref 135–145)

## 2017-06-17 LAB — GLUCOSE, CAPILLARY: GLUCOSE-CAPILLARY: 103 mg/dL — AB (ref 65–99)

## 2017-06-17 LAB — VITAMIN B12: VITAMIN B 12: 365 pg/mL (ref 180–914)

## 2017-06-17 LAB — URINE CULTURE

## 2017-06-17 MED ORDER — ATENOLOL 25 MG PO TABS
25.0000 mg | ORAL_TABLET | Freq: Every day | ORAL | Status: DC
  Administered 2017-06-17 – 2017-06-18 (×2): 25 mg via ORAL
  Filled 2017-06-17 (×2): qty 1

## 2017-06-17 NOTE — Progress Notes (Signed)
PROGRESS NOTE    Rhonda Andrews  FIE:[id number] DOB: May 09, 1941 DOA: 06/15/2017 PCP: Deloria Lair., MD    Brief Narrative:  76 y.o.femalewith medical history of chronic dizziness, chronic diarrhea, and palpitations presenting with acute worsening of dizziness when she woke up in the early morning of 06/15/2017. The patient took her blood pressure and noted it to be 235/112. The patient states that she has never had a diagnosis of hypertension. Review of her medical records over the last 3-4 months of office visits supports the patient's statement. In fact, the patient states that her systolic blood pressure runs in the 90-100 range at home. The patient states that she has felt lightheaded for the past 3-4. She describes it as "head in a cup" sensation which has been worse in past 2 weeks.  She states that this is usually precipitated by positional changes from changing from a recumbent position to standing position. She denies any syncope. She denies any fevers, chills, visual disturbance, focal weakness, dysarthria, dysphasia. There is no dysuria, hematuria, chest pain, shortness of breath. She has intermittent headaches. As a result of her dizziness and elevated blood pressure, the patient presents emergency department for further evaluation. Notably, the patient states that she takes propranolol 30 mg daily for her palpitations. After a few days of not taking the propranolol last week, the patient restarted it on 06/12/2017.   Assessment & Plan:   Active Problems:   Elevated troponin   C. difficile colitis  Supraventricular tachycardia Cardiology started atenolol Will monitor heart rate and blood pressure  Dizziness/vertigo - orthostatic hypotensive -received 1 L in the emergency department -Continue IV fluids -UA--TNTC WBC -B12 -CT brain negative -?complicated migraine vs aura without headpain - MR negative   C. Difficile colitis -06/04/2017 C. Difficile toxin  positive -Continue vancomycin po - improved diarrhea  UTI -TNTC WBC on UA -ceftriaxone pending culture data  Orthostatic hypotension -positive orthostatic vitals -IVF  Elevated troponin -Likely demand ischemia -No chest pain -Echocardiogram unrevealing -TSH--1.434 -EKG--sinus rhythm, nonspecific T-wave changes, IVCD  Elevated blood pressure -Patient endorses history of white coat hypertension -hydralazine prn SBP >180 - 24 hour urine for VMA, epi, metanephrines collected   Disposition Plan:   Home 10/18 if stable Family Communication:   Spouse updated at bedside  Consultants:  cardiology Code Status:  FULL DVT Prophylaxis:   [place] Lovenox   Antimicrobials:   none    Subjective: Patient is in bed.  Tearful during exam.  States that she feels like she just isn't back to normal.  Had episode of SVT this am with heart rate >200.  Cardiology following.  Worried she will go home and not do well.  Still feeling dizzy.  Objective: Vitals:   06/15/17 1523 06/15/17 2154 06/16/17 0557 06/16/17 2207  BP: (!) 142/74 137/66 (!) 158/70 (!) 142/74  Pulse: 96 73 66 60  Resp: 19 16 18 18   Temp: 97.6 F (36.4 C) 98 F (36.7 C) 98 F (36.7 C) 98 F (36.7 C)  TempSrc: Oral Oral Oral Oral  SpO2: 100% 98% 97% 96%  Weight:      Height:        Intake/Output Summary (Last 24 hours) at 06/17/17 1511 Last data filed at 06/16/17 1905  Gross per 24 hour  Intake           306.25 ml  Output              100 ml  Net  206.25 ml   Filed Weights   06/15/17 0332 06/15/17 1520  Weight: 61.7 kg (136 lb) 61.7 kg (136 lb)    Examination:  General exam: Appears calm and comfortable  Respiratory system: Clear to auscultation. Respiratory effort normal. Cardiovascular system: S1 & S2 heard, RRR. No JVD, murmurs, rubs, gallops or clicks. No pedal edema. Gastrointestinal system: Abdomen is nondistended, soft and nontender. No organomegaly or masses felt. Normal bowel sounds  heard. Central nervous system: Alert and oriented. No focal neurological deficits. Extremities: Symmetric 5 x 5 power. Skin: No rashes, lesions or ulcers Psychiatry: Judgement and insight appear normal. Mood & affect appropriate.     Data Reviewed: I have personally reviewed following labs and imaging studies  CBC:  Recent Labs Lab 06/15/17 0512  WBC 6.9  NEUTROABS 4.0  HGB 15.0  HCT 45.4  MCV 90.6  PLT 144   Basic Metabolic Panel:  Recent Labs Lab 06/15/17 0512 06/16/17 0656 06/17/17 0559  NA 139 140 138  K 4.1 3.6 3.5  CL 103 104 104  CO2 29 28 27   GLUCOSE 113* 103* 108*  BUN 23* 13 15  CREATININE 0.83 0.77 0.71  CALCIUM 9.1 8.4* 8.5*  MG  --  1.7  --    GFR: Estimated Creatinine Clearance: 53.3 mL/min (by C-G formula based on SCr of 0.71 mg/dL). Liver Function Tests: No results for input(s): AST, ALT, ALKPHOS, BILITOT, PROT, ALBUMIN in the last 168 hours. No results for input(s): LIPASE, AMYLASE in the last 168 hours. No results for input(s): AMMONIA in the last 168 hours. Coagulation Profile: No results for input(s): INR, PROTIME in the last 168 hours. Cardiac Enzymes:  Recent Labs Lab 06/15/17 0512 06/15/17 0858  TROPONINI 0.03* 0.03*   BNP (last 3 results) No results for input(s): PROBNP in the last 8760 hours. HbA1C: No results for input(s): HGBA1C in the last 72 hours. CBG:  Recent Labs Lab 06/17/17 0750  GLUCAP 103*   Lipid Profile: No results for input(s): CHOL, HDL, LDLCALC, TRIG, CHOLHDL, LDLDIRECT in the last 72 hours. Thyroid Function Tests:  Recent Labs  06/15/17 1457  TSH 1.434  FREET4 1.23*   Anemia Panel:  Recent Labs  06/17/17 0601  VITAMINB12 365   Sepsis Labs: No results for input(s): PROCALCITON, LATICACIDVEN in the last 168 hours.  Recent Results (from the past 240 hour(s))  Culture, Urine     Status: Abnormal   Collection Time: 06/16/17 12:05 AM  Result Value Ref Range Status   Specimen Description  URINE, RANDOM  Final   Special Requests NONE  Final   Culture MULTIPLE SPECIES PRESENT, SUGGEST RECOLLECTION (A)  Final   Report Status 06/17/2017 FINAL  Final         Radiology Studies: Mr Brain Wo Contrast  Result Date: 06/16/2017 CLINICAL DATA:  76 year old female with headache and weakness for 3 days. EXAM: MRI HEAD WITHOUT CONTRAST TECHNIQUE: Multiplanar, multiecho pulse sequences of the brain and surrounding structures were obtained without intravenous contrast. COMPARISON:  Head CT 06/15/2017 FINDINGS: Brain: Cerebral volume is normal. Cerebral volume is within normal limits for age. No restricted diffusion to suggest acute infarction. No midline shift, mass effect, evidence of mass lesion, ventriculomegaly, extra-axial collection or acute intracranial hemorrhage. Cervicomedullary junction and pituitary are within normal limits. Mild for age scattered small foci of nonspecific cerebral white matter T2 and FLAIR hyperintensity, many in the subcortical white matter. No cortical encephalomalacia or chronic cerebral blood products. Deep gray matter nuclei, brainstem, and cerebellum are normal.  Vascular: Major intracranial vascular flow voids are preserved. Skull and upper cervical spine: Negative. Visualized bone marrow signal is within normal limits. Sinuses/Orbits: Normal orbits soft tissues. Visualized paranasal sinuses and mastoids are stable and well pneumatized. Other: Visible internal auditory structures appear normal. Scalp and face soft tissues appear negative. IMPRESSION: No acute intracranial abnormality, and largely unremarkable for age noncontrast MRI appearance of the brain. Electronically Signed   By: Genevie Ann M.D.   On: 06/16/2017 12:51        Scheduled Meds: . aspirin EC  81 mg Oral Daily  . atenolol  25 mg Oral Daily  . enoxaparin (LOVENOX) injection  40 mg Subcutaneous Q24H  . vancomycin  125 mg Oral Q6H   Continuous Infusions: . cefTRIAXone (ROCEPHIN)  IV Stopped  (06/17/17 1211)     LOS: 1 day    Time spent: 30 minutes    Loretha Stapler, MD [facility] Pager [phone]  If 7PM-7AM, please contact night-coverage [email or web address] Password [email or web address] 06/17/2017, 3:11 PM

## 2017-06-17 NOTE — Progress Notes (Signed)
Central Telemetry reported to this nurse, patient HR sustaining at 210, MD contacted. Betablocker not ordered, due to 24 hour urine test per Dr. Carles Collet 06/16/17.

## 2017-06-17 NOTE — Care Management (Signed)
Provider list given as patient is looking for a new PCP. No other CM needs.

## 2017-06-17 NOTE — Progress Notes (Signed)
Progress Note  Patient Name: Rhonda Andrews Date of Encounter: 06/17/2017  Primary Cardiologist: Dr. Carlyle Dolly  Subjective   No chest pain, has felt her heart beating "fast" intermittently. No headache. Loose stools improved.  Inpatient Medications    Scheduled Meds: . aspirin EC  81 mg Oral Daily  . enoxaparin (LOVENOX) injection  40 mg Subcutaneous Q24H  . vancomycin  125 mg Oral Q6H   Continuous Infusions: . 0.9 % NaCl with KCl 20 mEq / L 75 mL/hr at 06/16/17 1235  . cefTRIAXone (ROCEPHIN)  IV Stopped (06/16/17 1312)   PRN Meds: acetaminophen **OR** acetaminophen, hydrALAZINE, ondansetron **OR** ondansetron (ZOFRAN) IV   Vital Signs    Vitals:   06/15/17 1523 06/15/17 2154 06/16/17 0557 06/16/17 2207  BP: (!) 142/74 137/66 (!) 158/70 (!) 142/74  Pulse: 96 73 66 60  Resp: 19 16 18 18   Temp: 97.6 F (36.4 C) 98 F (36.7 C) 98 F (36.7 C) 98 F (36.7 C)  TempSrc: Oral Oral Oral Oral  SpO2: 100% 98% 97% 96%  Weight:      Height:        Intake/Output Summary (Last 24 hours) at 06/17/17 1047 Last data filed at 06/16/17 1905  Gross per 24 hour  Intake           306.25 ml  Output              100 ml  Net           206.25 ml   Filed Weights   06/15/17 0332 06/15/17 1520  Weight: 136 lb (61.7 kg) 136 lb (61.7 kg)    Telemetry    Sinus rhythm with episodic PSVT. Personally reviewed.  Physical Exam   GEN: No acute distress.   Neck: No JVD. Cardiac: RRR, no murmur, rub, or gallop.  Respiratory: Nonlabored. Clear to auscultation bilaterally. GI: Soft, nontender, bowel sounds present. MS: No edema; No deformity. Neuro:  Nonfocal. Psych: Alert and oriented x 3. Normal affect.  Labs    Chemistry Recent Labs Lab 06/15/17 0512 06/16/17 0656 06/17/17 0559  NA 139 140 138  K 4.1 3.6 3.5  CL 103 104 104  CO2 29 28 27   GLUCOSE 113* 103* 108*  BUN 23* 13 15  CREATININE 0.83 0.77 0.71  CALCIUM 9.1 8.4* 8.5*  GFRNONAA >60 >60 >60  GFRAA  >60 >60 >60  ANIONGAP 7 8 7      Hematology Recent Labs Lab 06/15/17 0512  WBC 6.9  RBC 5.01  HGB 15.0  HCT 45.4  MCV 90.6  MCH 29.9  MCHC 33.0  RDW 13.1  PLT 209    Cardiac Enzymes Recent Labs Lab 06/15/17 0512 06/15/17 0858  TROPONINI 0.03* 0.03*   No results for input(s): TROPIPOC in the last 168 hours.   Radiology    Mr Brain Wo Contrast  Result Date: 06/16/2017 CLINICAL DATA:  76 year old female with headache and weakness for 3 days. EXAM: MRI HEAD WITHOUT CONTRAST TECHNIQUE: Multiplanar, multiecho pulse sequences of the brain and surrounding structures were obtained without intravenous contrast. COMPARISON:  Head CT 06/15/2017 FINDINGS: Brain: Cerebral volume is normal. Cerebral volume is within normal limits for age. No restricted diffusion to suggest acute infarction. No midline shift, mass effect, evidence of mass lesion, ventriculomegaly, extra-axial collection or acute intracranial hemorrhage. Cervicomedullary junction and pituitary are within normal limits. Mild for age scattered small foci of nonspecific cerebral white matter T2 and FLAIR hyperintensity, many in the subcortical white matter. No  cortical encephalomalacia or chronic cerebral blood products. Deep gray matter nuclei, brainstem, and cerebellum are normal. Vascular: Major intracranial vascular flow voids are preserved. Skull and upper cervical spine: Negative. Visualized bone marrow signal is within normal limits. Sinuses/Orbits: Normal orbits soft tissues. Visualized paranasal sinuses and mastoids are stable and well pneumatized. Other: Visible internal auditory structures appear normal. Scalp and face soft tissues appear negative. IMPRESSION: No acute intracranial abnormality, and largely unremarkable for age noncontrast MRI appearance of the brain. Electronically Signed   By: Genevie Ann M.D.   On: 06/16/2017 12:51    Cardiac Studies   Echocardiogram 06/16/2017: Study Conclusions  - Left ventricle: The  cavity size was normal. Wall thickness was   increased in a pattern of mild LVH. Systolic function was   vigorous. The estimated ejection fraction was in the range of 65%   to 70%. Wall motion was normal; there were no regional wall   motion abnormalities. Doppler parameters are consistent with   abnormal left ventricular relaxation (grade 1 diastolic   dysfunction). - Mitral valve: There was mild regurgitation. - Right atrium: Central venous pressure (est): 3 mm Hg. - Tricuspid valve: There was trivial regurgitation. - Pulmonary arteries: PA peak pressure: 20 mm Hg (S). - Pericardium, extracardiac: A prominent pericardial fat pad was   present.  Impressions:  - Mild LVH with LVEF 65-70% and grade 1 diastolic dysfunction. Mild   mitral regurgitation. Trivial tricuspid regurgitation with PASP   20 mmHg. Prominent pericardial fat pad.  Patient Profile     76 y.o. female with a history of palpitations, blood pressure fluctuation, now presenting with feeling of dizziness and documented hypertension over her baseline. She had a brief episode of SVT noted in the ER, but no subsequent arrhythmias by telemetry monitoring. Minor troponin elevation of 0.03 is not consistent with ACS, and she has not had any recent chest pain.  Assessment & Plan    1. PSVT, longer episode seen on telemetry within the last 12 hours. Interestingly this occurred after discontinuation of propranolol while she was having 24-hour urine collection for metanephrine and normetanephrines.  2. Elevated blood pressure without prior diagnosis of essential hypertension.  3. Intermittent dizziness and feeling of fullness in her head. Brain MRI was negative for acute findings.  4. Intermittent diarrhea followed by GI with history of Campylobacter and more recently Clostridium difficile.  Discussed with patient and husband. I reviewed her echocardiogram results which show normal LVEF and no major valvular abnormalities.  Recommend starting atenolol 25 mg daily, follow-up on heart rate and blood pressure. She should increase activity, it is not clear that she will need further cardiac testing as an inpatient. She needs to be scheduled to follow-up with Dr. Harl Bowie after discharge.  Signed, Rozann Lesches, MD  06/17/2017, 10:47 AM

## 2017-06-17 NOTE — Progress Notes (Signed)
Pt stated she is starting to feeling tingling in her "calf muscles." Pt thinks her potassium is low and is stating that she thinks the potassium given to her earlier is the cause of her tingling. RN adv pt that the only Potassium she received is through her IVF that were discontinued per MD this am. RN also adv pt that her potassium level this am was 3.5 which is normal. Pt stated that she had had 4 liquid bowel movements all day long and the Vancomycin is making her have diarrhea. RN adv pt that Vancomycin is to treat her C-diff infection that she was diagnosed with on 10-4 and it should be stopping her diarrhea. RN educated pt that diarrhea can cause her to be dehydrated as lose potassium that way.  Dr. Hilbert Bible paged to make aware of pts symptoms. No labs ordered for am, and MD note pt may be discharged if pt is stable. RN asked Dr. Hilbert Bible to order am labs to be certain. RN also educated pt to let Dr. Adair Patter know about all her diarrhea because MD note states her diarrhea has gotten better and this is not the case. Pt verbalized understanding.

## 2017-06-18 DIAGNOSIS — I471 Supraventricular tachycardia: Secondary | ICD-10-CM

## 2017-06-18 LAB — BASIC METABOLIC PANEL
Anion gap: 7 (ref 5–15)
BUN: 17 mg/dL (ref 6–20)
CO2: 29 mmol/L (ref 22–32)
Calcium: 8.8 mg/dL — ABNORMAL LOW (ref 8.9–10.3)
Chloride: 103 mmol/L (ref 101–111)
Creatinine, Ser: 0.8 mg/dL (ref 0.44–1.00)
GFR calc Af Amer: >60 mL/min (ref >60)
GFR calc non Af Amer: >60 mL/min (ref >60)
Glucose, Bld: 96 mg/dL (ref 65–99)
Potassium: 4 mmol/L (ref 3.5–5.1)
Sodium: 139 mmol/L (ref 135–145)

## 2017-06-18 MED ORDER — ASPIRIN 81 MG PO TBEC: 81.0000 mg | DELAYED_RELEASE_TABLET | Freq: Every day | ORAL | Status: DC

## 2017-06-18 MED ORDER — ATENOLOL 25 MG PO TABS: 25.0000 mg | ORAL_TABLET | Freq: Every day | ORAL | 0 refills | Status: DC

## 2017-06-18 MED ORDER — CEPHALEXIN 500 MG PO CAPS: 500.0000 mg | ORAL_CAPSULE | Freq: Two times a day (BID) | ORAL | 0 refills | Status: AC

## 2017-06-18 NOTE — Discharge Summary (Signed)
Physician Discharge Summary  Rhonda Andrews VQQ:[id number] DOB: 1940-09-08 DOA: 06/15/2017  PCP: Deloria Lair., MD  Admit date: 06/15/2017 Discharge date: 06/18/2017  Admitted From: Home Disposition:   Home  Recommendations for Outpatient Follow-up:  1. Follow up with PCP in 1-2 weeks 2. Please obtain BMP/CBC in one week 3. Please follow up on the following pending results:  Home Health: No  Equipment/Devices: None  Discharge Condition:Stable CODE STATUS:Full Diet recommendation: Heart Healthy  Brief/Interim Summary: 76 y.o.femalewith medical history of chronic dizziness, chronic diarrhea, and palpitations presenting with acute worsening of dizziness when she woke up in the early morning of 06/15/2017. The patient took her blood pressure and noted it to be 235/112. The patient states that she has never had a diagnosis of hypertension. Review of her medical records over the last 3-4 months of office visits supports the patient's statement. In fact, the patient states that her systolic blood pressure runs in the 90-100 range at home. The patient states that she has felt lightheaded for the past 3-4. She describes it as "head in a cup" sensation which has been worse in past 2 weeks. She states that this is usually precipitated by positional changes from changing from a recumbent position to standing position. She denies any syncope. She denies any fevers, chills, visual disturbance, focal weakness, dysarthria, dysphasia. There is no dysuria, hematuria, chest pain, shortness of breath. She has intermittent headaches. As a result of her dizziness and elevated blood pressure, the patient presents emergency department for further evaluation. Notably, the patient states that she takes propranolol 30 mg daily for her palpitations. After a few days of not taking the propranolol last week, the patient restarted it on 06/12/2017.  She voiced she felt dizzy and drowsy.  Upon admission found to  have intermittent SVT.  Cardiology was consulted.  She was taken off propranolol and placed on atenolol.  She had no further episodes of SVT after that.  She was instructed to follow up with Dr. Harl Bowie outpatient.  She also mentions prior admission she was found to have C. Diff and was given a prescription for vancomycin which she never started as she came to the hospital.  Her diarrhea was stable over admission and she was instructed to finish her course of vancomycin outpatient and follow up with Dr. Laural Golden.  Lastly, on admission she was found to have a urinary tract infection and thus was started on Ceftriaxone and transitioned to Keflex to finish the course outpatient.  Discharge Diagnoses:  Active Problems:   Elevated troponin   C. difficile colitis    Discharge Instructions  Discharge Instructions    Call MD for:  difficulty breathing, headache or visual disturbances    Complete by:  As directed    Call MD for:  extreme fatigue    Complete by:  As directed    Call MD for:  persistant dizziness or light-headedness    Complete by:  As directed    Call MD for:  persistant nausea and vomiting    Complete by:  As directed    Call MD for:  redness, tenderness, or signs of infection (pain, swelling, redness, odor or green/yellow discharge around incision site)    Complete by:  As directed    Call MD for:  severe uncontrolled pain    Complete by:  As directed    Call MD for:  temperature >100.4    Complete by:  As directed    Diet - low sodium heart  healthy    Complete by:  As directed    Discharge instructions    Complete by:  As directed    Take antibiotics as prescribed F/u with Dr. Harl Bowie, Dr. Laural Golden and your PCP   Increase activity slowly    Complete by:  As directed      Allergies as of 06/18/2017      Reactions   Prednisone Other (See Comments)   Heart races      Medication List    STOP taking these medications   loperamide 2 MG capsule Commonly known as:  IMODIUM    propranolol ER 60 MG 24 hr capsule Commonly known as:  INDERAL LA     TAKE these medications   aspirin 81 MG EC tablet Take 1 tablet (81 mg total) by mouth daily.   atenolol 25 MG tablet Commonly known as:  TENORMIN Take 1 tablet (25 mg total) by mouth daily.   cephALEXin 500 MG capsule Commonly known as:  KEFLEX Take 1 capsule (500 mg total) by mouth 2 (two) times daily.   cholecalciferol 1000 units tablet Commonly known as:  VITAMIN D Take 1,000 Units by mouth daily.   lactobacillus acidophilus Tabs tablet Take 1 tablet by mouth daily.   meclizine 25 MG tablet Commonly known as:  ANTIVERT Take 1 tablet (25 mg total) by mouth 3 (three) times daily as needed for dizziness.   vancomycin 125 MG capsule Commonly known as:  VANCOCIN HCL Take 1 capsule (125 mg total) by mouth 3 (three) times daily.      Follow-up Information    Deloria Lair., MD. Schedule an appointment as soon as possible for a visit in 2 day(s).   Specialty:  Family Medicine Contact information: [place] [address] [phone]        Arnoldo Lenis, MD. Schedule an appointment as soon as possible for a visit in 2 week(s).   Specialty:  Cardiology Contact information: [place] [address] [phone]          Allergies  Allergen Reactions  . Prednisone Other (See Comments)    Heart races    Consultations:  Cardiology   Procedures/Studies: Ct Head Wo Contrast  Result Date: 06/15/2017 CLINICAL DATA:  Elevated blood pressure, did not feel RIGHT, suspected subarachnoid hemorrhage EXAM: CT HEAD WITHOUT CONTRAST TECHNIQUE: Contiguous axial images were obtained from the base of the skull through the vertex without intravenous contrast. Sagittal and coronal MPR images reconstructed from axial data set. COMPARISON:  None FINDINGS: Brain: Beam hardening artifacts from skullbase traversed pons. Generalized age-related atrophy. Normal ventricular  morphology. No midline shift or mass effect. Small vessel chronic ischemic changes of deep cerebral white matter. No intracranial hemorrhage, mass lesion, evidence of acute infarction, or extra-axial fluid collection. Ossification within anterior falx. Vascular: Normal appearance Skull: Intact Sinuses/Orbits: Clear Other: N/A IMPRESSION: Age-related atrophy with mild small vessel chronic ischemic changes of deep cerebral white matter. No acute intracranial abnormalities. Electronically Signed   By: Lavonia Dana M.D.   On: 06/15/2017 10:55   Mr Brain Wo Contrast  Result Date: 06/16/2017 CLINICAL DATA:  76 year old female with headache and weakness for 3 days. EXAM: MRI HEAD WITHOUT CONTRAST TECHNIQUE: Multiplanar, multiecho pulse sequences of the brain and surrounding structures were obtained without intravenous contrast. COMPARISON:  Head CT 06/15/2017 FINDINGS: Brain: Cerebral volume is normal. Cerebral volume is within normal limits for age. No restricted diffusion to suggest acute infarction. No midline shift,  mass effect, evidence of mass lesion, ventriculomegaly, extra-axial collection or acute intracranial hemorrhage. Cervicomedullary junction and pituitary are within normal limits. Mild for age scattered small foci of nonspecific cerebral white matter T2 and FLAIR hyperintensity, many in the subcortical white matter. No cortical encephalomalacia or chronic cerebral blood products. Deep gray matter nuclei, brainstem, and cerebellum are normal. Vascular: Major intracranial vascular flow voids are preserved. Skull and upper cervical spine: Negative. Visualized bone marrow signal is within normal limits. Sinuses/Orbits: Normal orbits soft tissues. Visualized paranasal sinuses and mastoids are stable and well pneumatized. Other: Visible internal auditory structures appear normal. Scalp and face soft tissues appear negative. IMPRESSION: No acute intracranial abnormality, and largely unremarkable for age  noncontrast MRI appearance of the brain. Electronically Signed   By: Genevie Ann M.D.   On: 06/16/2017 12:51   Echo: Mild LVH with LVEF 65-70% and grade 1 diastolic dysfunction. Mild   mitral regurgitation. Trivial tricuspid regurgitation with PASP   20 mmHg. Prominent pericardial fat pad.  Subjective: Patient feeling less dizzy and weak.  Feels somewhat ready to go home.  Denies needing medication for C. Diff (already at the pharmacy).  Understands follow up plan.  Denies chest pain, palpitations, abdominal pain, shortness of breath, dysuria.  Discharge Exam: Vitals:   06/17/17 2046 06/18/17 0621  BP: 139/60 (!) 141/71  Pulse: 67 68  Resp: 18 16  Temp: 98.2 F (36.8 C) 97.9 F (36.6 C)  SpO2: 99% 99%   Vitals:   06/17/17 1724 06/17/17 1953 06/17/17 2046 06/18/17 0621  BP: (!) 149/68  139/60 (!) 141/71  Pulse: 76  67 68  Resp: 16  18 16   Temp: 98.2 F (36.8 C)  98.2 F (36.8 C) 97.9 F (36.6 C)  TempSrc: Oral  Oral Oral  SpO2: 100% 99% 99% 99%  Weight:      Height:        General: Pt is alert, awake, not in acute distress Cardiovascular: RRR, S1/S2 +, no rubs, no gallops Respiratory: CTA bilaterally, no wheezing, no rhonchi Abdominal: Soft, NT, ND, bowel sounds + Extremities: no edema, no cyanosis    The results of significant diagnostics from this hospitalization (including imaging, microbiology, ancillary and laboratory) are listed below for reference.     Microbiology: Recent Results (from the past 240 hour(s))  Culture, Urine     Status: Abnormal   Collection Time: 06/16/17 12:05 AM  Result Value Ref Range Status   Specimen Description URINE, RANDOM  Final   Special Requests NONE  Final   Culture MULTIPLE SPECIES PRESENT, SUGGEST RECOLLECTION (A)  Final   Report Status 06/17/2017 FINAL  Final     Labs: BNP (last 3 results) No results for input(s): BNP in the last 8760 hours. Basic Metabolic Panel:  Recent Labs Lab 06/15/17 0512 06/16/17 0656  06/17/17 0559 06/18/17 0640  NA 139 140 138 139  K 4.1 3.6 3.5 4.0  CL 103 104 104 103  CO2 29 28 27 29   GLUCOSE 113* 103* 108* 96  BUN 23* 13 15 17   CREATININE 0.83 0.77 0.71 0.80  CALCIUM 9.1 8.4* 8.5* 8.8*  MG  --  1.7  --   --    Liver Function Tests: No results for input(s): AST, ALT, ALKPHOS, BILITOT, PROT, ALBUMIN in the last 168 hours. No results for input(s): LIPASE, AMYLASE in the last 168 hours. No results for input(s): AMMONIA in the last 168 hours. CBC:  Recent Labs Lab 06/15/17 0512  WBC 6.9  NEUTROABS  4.0  HGB 15.0  HCT 45.4  MCV 90.6  PLT 209   Cardiac Enzymes:  Recent Labs Lab 06/15/17 0512 06/15/17 0858  TROPONINI 0.03* 0.03*   BNP: Invalid input(s): POCBNP CBG:  Recent Labs Lab 06/17/17 0750  GLUCAP 103*   D-Dimer No results for input(s): DDIMER in the last 72 hours. Hgb A1c No results for input(s): HGBA1C in the last 72 hours. Lipid Profile No results for input(s): CHOL, HDL, LDLCALC, TRIG, CHOLHDL, LDLDIRECT in the last 72 hours. Thyroid function studies  Recent Labs  06/15/17 1457  TSH 1.434   Anemia work up  Recent Labs  06/17/17 0601  VITAMINB12 365   Urinalysis    Component Value Date/Time   COLORURINE YELLOW 06/16/2017 0005   APPEARANCEUR HAZY (A) 06/16/2017 0005   LABSPEC 1.016 06/16/2017 0005   PHURINE 5.0 06/16/2017 0005   GLUCOSEU NEGATIVE 06/16/2017 0005   HGBUR SMALL (A) 06/16/2017 0005   BILIRUBINUR NEGATIVE 06/16/2017 0005   KETONESUR NEGATIVE 06/16/2017 0005   PROTEINUR NEGATIVE 06/16/2017 0005   UROBILINOGEN 0.2 03/19/2015 1109   NITRITE NEGATIVE 06/16/2017 0005   LEUKOCYTESUR LARGE (A) 06/16/2017 0005   Sepsis Labs Invalid input(s): PROCALCITONIN,  WBC,  LACTICIDVEN Microbiology Recent Results (from the past 240 hour(s))  Culture, Urine     Status: Abnormal   Collection Time: 06/16/17 12:05 AM  Result Value Ref Range Status   Specimen Description URINE, RANDOM  Final   Special Requests NONE   Final   Culture MULTIPLE SPECIES PRESENT, SUGGEST RECOLLECTION (A)  Final   Report Status 06/17/2017 FINAL  Final     Time coordinating discharge: Over 30 minutes  SIGNED:   Loretha Stapler, MD  [facility] 06/18/2017, 12:15 PM Pager [phone] If 7PM-7AM, please contact night-coverage [email or web address] Password [email or web address]

## 2017-06-18 NOTE — Progress Notes (Signed)
Discharge instructions read to patient. Dr Sherie Don notified for questions patient had about medications. All questions answered to patient satisfaction. Discharged to home with family.

## 2017-06-18 NOTE — Progress Notes (Signed)
Progress Note  Patient Name: Rhonda Andrews Date of Encounter: 06/18/2017   Primary Cardiologist: Dr. Carlyle Dolly  Subjective   No chest pain or palpitations. Tolerating atenolol so far. Less feeling of fullness in her head. Reports having increased episodes of diarrhea on vancomycin.  Inpatient Medications    Scheduled Meds: . aspirin EC  81 mg Oral Daily  . atenolol  25 mg Oral Daily  . enoxaparin (LOVENOX) injection  40 mg Subcutaneous Q24H  . vancomycin  125 mg Oral Q6H   Continuous Infusions: . cefTRIAXone (ROCEPHIN)  IV Stopped (06/17/17 1211)   PRN Meds: acetaminophen **OR** acetaminophen, hydrALAZINE, ondansetron **OR** ondansetron (ZOFRAN) IV   Vital Signs    Vitals:   06/17/17 1724 06/17/17 1953 06/17/17 2046 06/18/17 0621  BP: (!) 149/68  139/60 (!) 141/71  Pulse: 76  67 68  Resp: 16  18 16   Temp: 98.2 F (36.8 C)  98.2 F (36.8 C) 97.9 F (36.6 C)  TempSrc: Oral  Oral Oral  SpO2: 100% 99% 99% 99%  Weight:      Height:        Intake/Output Summary (Last 24 hours) at 06/18/17 0830 Last data filed at 06/18/17 0620  Gross per 24 hour  Intake              200 ml  Output                0 ml  Net              200 ml   Filed Weights   06/15/17 0332 06/15/17 1520  Weight: 136 lb (61.7 kg) 136 lb (61.7 kg)    Telemetry    Sinus rhythm. Personally reviewed.  Physical Exam   GEN: No acute distress.   Neck: No JVD. Cardiac: RRR, no murmur, rub, or gallop.  Respiratory: Nonlabored. Clear to auscultation bilaterally. GI: Soft, nontender, bowel sounds present. MS: No edema; No deformity. Neuro:  Nonfocal. Psych: Alert and oriented x 3. Normal affect.  Labs    Chemistry Recent Labs Lab 06/16/17 0656 06/17/17 0559 06/18/17 0640  NA 140 138 139  K 3.6 3.5 4.0  CL 104 104 103  CO2 28 27 29   GLUCOSE 103* 108* 96  BUN 13 15 17   CREATININE 0.77 0.71 0.80  CALCIUM 8.4* 8.5* 8.8*  GFRNONAA >60 >60 >60  GFRAA >60 >60 >60  ANIONGAP 8  7 7      Hematology Recent Labs Lab 06/15/17 0512  WBC 6.9  RBC 5.01  HGB 15.0  HCT 45.4  MCV 90.6  MCH 29.9  MCHC 33.0  RDW 13.1  PLT 209    Cardiac Enzymes Recent Labs Lab 06/15/17 0512 06/15/17 0858  TROPONINI 0.03* 0.03*   No results for input(s): TROPIPOC in the last 168 hours.   Radiology    Mr Brain Wo Contrast  Result Date: 06/16/2017 CLINICAL DATA:  76 year old female with headache and weakness for 3 days. EXAM: MRI HEAD WITHOUT CONTRAST TECHNIQUE: Multiplanar, multiecho pulse sequences of the brain and surrounding structures were obtained without intravenous contrast. COMPARISON:  Head CT 06/15/2017 FINDINGS: Brain: Cerebral volume is normal. Cerebral volume is within normal limits for age. No restricted diffusion to suggest acute infarction. No midline shift, mass effect, evidence of mass lesion, ventriculomegaly, extra-axial collection or acute intracranial hemorrhage. Cervicomedullary junction and pituitary are within normal limits. Mild for age scattered small foci of nonspecific cerebral white matter T2 and FLAIR hyperintensity, many in the subcortical  white matter. No cortical encephalomalacia or chronic cerebral blood products. Deep gray matter nuclei, brainstem, and cerebellum are normal. Vascular: Major intracranial vascular flow voids are preserved. Skull and upper cervical spine: Negative. Visualized bone marrow signal is within normal limits. Sinuses/Orbits: Normal orbits soft tissues. Visualized paranasal sinuses and mastoids are stable and well pneumatized. Other: Visible internal auditory structures appear normal. Scalp and face soft tissues appear negative. IMPRESSION: No acute intracranial abnormality, and largely unremarkable for age noncontrast MRI appearance of the brain. Electronically Signed   By: Genevie Ann M.D.   On: 06/16/2017 12:51    Cardiac Studies   Echocardiogram 06/16/2017: Study Conclusions  - Left ventricle: The cavity size was normal.  Wall thickness was increased in a pattern of mild LVH. Systolic function was vigorous. The estimated ejection fraction was in the range of 65% to 70%. Wall motion was normal; there were no regional wall motion abnormalities. Doppler parameters are consistent with abnormal left ventricular relaxation (grade 1 diastolic dysfunction). - Mitral valve: There was mild regurgitation. - Right atrium: Central venous pressure (est): 3 mm Hg. - Tricuspid valve: There was trivial regurgitation. - Pulmonary arteries: PA peak pressure: 20 mm Hg (S). - Pericardium, extracardiac: A prominent pericardial fat pad was present.  Impressions:  - Mild LVH with LVEF 65-70% and grade 1 diastolic dysfunction. Mild mitral regurgitation. Trivial tricuspid regurgitation with PASP 20 mmHg. Prominent pericardial fat pad.  Patient Profile     76 y.o. female with a history of palpitations, blood pressure fluctuation, now presenting with feeling of dizziness and documented hypertension over her baseline. She had a brief episode of SVT noted in the ER,but no subsequent arrhythmias by telemetry monitoring. Minor troponin elevation of 0.03 is not consistent with ACS, and she has not had any recent chest pain.  Assessment & Plan    1. PSVT, placed on atenolol 25 mg daily. No recurrent arrhythmia overnight.  2. Fluctuating blood pressures, being evaluated for pheochromocytoma (unlikely), 24 hour urine for metanephrines and normetanephrines pending.  3. Intermittent dizziness and fullness in her head. Brain MRI was negative for acute findings.  4. History of diarrhea followed by GI with Clostridium difficile. Currently on vancomycin.  No additional testing planned from a cardiac perspective. Will continue atenolol 25 mg daily. 24 urine collection can be followed up on as an outpatient. We are scheduling a follow-up visit with Dr. Harl Bowie in the [place] office. We will sign off for now.  Signed, Rozann Lesches, MD  06/18/2017, 8:30 AM

## 2017-06-19 LAB — METANEPHRINES, URINE, 24 HOUR
METANEPH TOTAL UR: 59 ug/L
METANEPHRINES 24H UR: 106 ug/(24.h) (ref 45–290)
NORMETANEPHRINE 24H UR: 304 ug/(24.h) (ref 82–500)
Normetanephrine, Ur: 169 ug/L
Total Volume: 1800

## 2017-06-19 LAB — METANEPHRINES, PLASMA
Metanephrine, Free: 32 pg/mL (ref 0–62)
NORMETANEPHRINE FREE: 176 pg/mL — AB (ref 0–145)

## 2017-06-21 ENCOUNTER — Telehealth: Payer: Self-pay | Admitting: Gastroenterology

## 2017-06-21 DIAGNOSIS — Z8619 Personal history of other infectious and parasitic diseases: Secondary | ICD-10-CM

## 2017-06-21 NOTE — Telephone Encounter (Signed)
PT GOT BETTER AFTER TAKING VANC IN HOSPITAL. D/C ON VANC 125MG  TID. ASKED PT TO INCREASE VANC TO QID. OK TO USE IMODIUM BID AND KAOPECTATE IF NEEDED. PT NEEDS TO SUBMIT STOOL FOR C DIFF PCR ON OCT 22. ENCOURAGED PT TO CONSIDER FECAL TRANSPLANT AT [facility].

## 2017-06-22 ENCOUNTER — Encounter (INDEPENDENT_AMBULATORY_CARE_PROVIDER_SITE_OTHER): Payer: Self-pay | Admitting: Internal Medicine

## 2017-06-22 ENCOUNTER — Ambulatory Visit (INDEPENDENT_AMBULATORY_CARE_PROVIDER_SITE_OTHER): Payer: Medicare Other | Admitting: Internal Medicine

## 2017-06-22 VITALS — BP 130/80 | HR 68 | Temp 97.3°F | Ht 64.0 in | Wt 133.8 lb

## 2017-06-22 DIAGNOSIS — R42 Dizziness and giddiness: Secondary | ICD-10-CM

## 2017-06-22 DIAGNOSIS — A0472 Enterocolitis due to Clostridium difficile, not specified as recurrent: Secondary | ICD-10-CM | POA: Diagnosis not present

## 2017-06-22 MED ORDER — MECLIZINE HCL 25 MG PO TABS: 25.0000 mg | ORAL_TABLET | Freq: Three times a day (TID) | ORAL | 1 refills | Status: DC | PRN

## 2017-06-22 NOTE — Progress Notes (Signed)
   Subjective:    Patient ID: Rhonda Andrews, female    DOB: 1941-03-11, 76 y.o.   MRN: [id number]  HPI Here today for f/u. Recently in the hospital with elevated blood pressure. She was also positive for C-diff and salmonella. She was covered with Vancomycin 125mg  qid. She says she is still having diarrhea. Watery.  She has had 2 stools today which were watery. Yesterday she had 5 stools.  Disharged from hospital Thursday afternoon, Admitted x 4 days.    Review of Systems Past Medical History:  Diagnosis Date  . Clostridium difficile diarrhea 02/05/2015  . 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 Lactose intolerance   . Palpitations     Past Surgical History:  Procedure Laterality Date  . ABDOMINAL HYSTERECTOMY     ovaries remained  . CHOLECYSTECTOMY  3 yrs ago  . Precancerous  Tissue - Nose  February 14-2014    Allergies  Allergen Reactions  . Prednisone Other (See Comments)    Heart races    Current Outpatient Prescriptions on File Prior to Visit  Medication Sig Dispense Refill  . aspirin EC 81 MG EC tablet Take 1 tablet (81 mg total) by mouth daily.    . cholecalciferol (VITAMIN D) 1000 units tablet Take 1,000 Units by mouth daily.    Marland Kitchen lactobacillus acidophilus (BACID) TABS tablet Take 1 tablet by mouth daily.     . vancomycin (VANCOCIN HCL) 125 MG capsule Take 1 capsule (125 mg total) by mouth 3 (three) times daily. 42 capsule 0   No current facility-administered medications on file prior to visit.         Objective:   Physical Exam Blood pressure 130/80, pulse 68, temperature (!) 97.3 F (36.3 C), height 5\' 4"  (1.626 m), weight 133 lb 12.8 oz (60.7 kg). Alert and oriented. Skin warm and dry. Oral mucosa is moist.   . Sclera anicteric, conjunctivae is pink. Thyroid not enlarged. No cervical lymphadenopathy. Lungs clear. Heart regular rate and rhythm.  Abdomen is soft. Bowel sounds are positive. No hepatomegaly. No abdominal masses felt. No tenderness.  No edema to lower  extremities.            Assessment & Plan:  C diff. Will repeat GI pathogen today.Continue the Vancomycin. Further recommendations to follow.

## 2017-06-22 NOTE — Patient Instructions (Addendum)
GI pathogen. Further recommendations to follow.

## 2017-06-22 NOTE — Telephone Encounter (Signed)
  Last GI pathogen panel also revealed Salmonella. Will wait for the test to be done today.

## 2017-06-23 DIAGNOSIS — I10 Essential (primary) hypertension: Secondary | ICD-10-CM | POA: Diagnosis not present

## 2017-06-23 DIAGNOSIS — A0472 Enterocolitis due to Clostridium difficile, not specified as recurrent: Secondary | ICD-10-CM | POA: Diagnosis not present

## 2017-06-23 DIAGNOSIS — N390 Urinary tract infection, site not specified: Secondary | ICD-10-CM | POA: Diagnosis not present

## 2017-06-25 ENCOUNTER — Telehealth: Payer: Self-pay | Admitting: *Deleted

## 2017-06-25 LAB — GASTROINTESTINAL PATHOGEN PANEL PCR
C. difficile Tox A/B, PCR: NOT DETECTED
Campylobacter, PCR: NOT DETECTED
Cryptosporidium, PCR: NOT DETECTED
E COLI (ETEC) LT/ST, PCR: NOT DETECTED
E COLI (STEC) STX1/STX2, PCR: NOT DETECTED
E COLI 0157, PCR: NOT DETECTED
GIARDIA LAMBLIA, PCR: NOT DETECTED
NOROVIRUS, PCR: NOT DETECTED
Rotavirus A, PCR: NOT DETECTED
SHIGELLA, PCR: NOT DETECTED
Salmonella, PCR: NOT DETECTED

## 2017-06-25 NOTE — Telephone Encounter (Signed)
Patient called stating that she feels like her BP is going up again.  Stated that she had one reading the other night at 10:30 pm where she got up to go to bathroom - BP 235/112.  States that all of her other readings have been good.  No c/o chest pain or SOB at this time.  Does have dizziness, but this is not new.  Stated that she has post hospital scheduled 07/02/2017 with KL in [place] office.  Stated that her Propranolol was stopped at hospital visit & put on Atenolol 25mg  daily.  Advised her to continue to log her readings & bring with her to her OV with KL for provider review.  If elevated numbers continue to be a trend for her to call office back before OV.  Also, if symptoms worsen - ED if she feels necessary.  Patient verbalized understanding.  Message sent to provider for any further suggestions in the meantime.

## 2017-06-26 NOTE — Telephone Encounter (Signed)
I agreew with your assement and plan  Zandra Abts MD

## 2017-06-27 NOTE — Telephone Encounter (Signed)
Rhonda Andrews called today worried about blood pressure. When she gets up and ambulates, her BP gets into the 170s. She has occasional head aches. Told the patient to take an extra dose of atenolol and to call the clinic Monday if she is still having issues.  Allegra Lai, MD

## 2017-06-28 ENCOUNTER — Telehealth: Payer: Self-pay | Admitting: Physician Assistant

## 2017-06-28 NOTE — Telephone Encounter (Signed)
Paged by answering service  Recently increase Atenolol 25mg  BID by Dr. Shonna Chock. With ambulation her BP is 185/110 &  P 158. At rest BP of 189/74 P 71. She does feels palpitation with ambulation. intermittent dizziness. She has chronic SOB. No chest pain or syncope.   Advised continue to take Atenolol 25mg  BID. May take of extra 25mg  q 8 hours for persistent elevated Rate. If no improvement call office in AM. She will go to ER if significantly worsen symptoms. Patient is agree with plan.

## 2017-06-29 ENCOUNTER — Telehealth: Payer: Self-pay | Admitting: *Deleted

## 2017-06-29 NOTE — Telephone Encounter (Signed)
I agree with your assessment and plan   Carlyle Dolly MD

## 2017-06-29 NOTE — Telephone Encounter (Signed)
Spoke with patient and she said that she was advised to contact our office today with instructions on her BP medication. Patient said that atenolol makes her drowsy. Patient confirmed that she is taking atenolol 25 mg BID and is using a digital home BP monitor ([employer]) to check her BP's at home. Patient said that she's had this monitor for 1 & 1/2 years and has not had monitor checked recently for accuracy. BP this morning-did not take BP. Patient advised to check her BP while on the phone BP 157/77 &  HR 66. Patient reports dizziness after taking the atenolol. Patient advised that this is a side effect of atenolol and that she also has a history of dizziness in which she has been referred to a neurologist and is awaiting that appointment information since she was hospitalized when she was contacted about her referral. Patient advised that message would be routed to our [facility] department to follow up on her neurology referral. Patient stated that she checks her BP randomly throughout the day and is using a different arm each time. Patient offered to come to the office this morning for nurse BP check but declined. Encouraged patient to change positions slowly to help with dizziness. Patient advised to continue her atenolol 25 mg BID as prescribed per recent phone note, check her BP daily using the same cuff, same arm and same time, preferably an hour after she takes her BP medication, record the results and contact our office on Friday with the readings. Patient advised that if her symptoms get worse, to go to the ED for an evaluation. Patient verbalized understanding of plan. Patient is aware of her up coming appointment on 07/02/17 with [facility].

## 2017-06-30 NOTE — Progress Notes (Deleted)
Cardiology Office Note   Date:  06/30/2017   ID:  Rhonda Andrews, [name] 1941/06/27, MRN [id number]  PCP:  Deloria Lair., MD  Cardiologist:  Dr. Carlyle Dolly  No chief complaint on file.     History of Present Illness: Rhonda Andrews is a 76 y.o. female who presents for posthospitalization follow-up, after being seen on consultation for complaints of palpitations, labile blood pressure, and one brief episode of SVT during evaluation in ER.  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  Due to labile blood pressure 24-hour urine for metanephrines and normetanephrine's was completed.The patient was also treated for urinary tract infection the patient called our office on 10.  29 2018 with complaints of atenolol causing drowsiness.  Blood pressure was checked over the phone by the patient's home monitoring system, with a blood pressure 157/77 and heart rate of 66.  The patient states that she felt dizzy after taking atenolol.  The patient is here for ongoing evaluation and management of heart rate and blood pressure control on atenolol.   Past Medical History:  Diagnosis Date  . Clostridium difficile diarrhea 02/05/2015  . 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 Lactose intolerance   . Palpitations     Past Surgical History:  Procedure Laterality Date  . ABDOMINAL HYSTERECTOMY     ovaries remained  . CHOLECYSTECTOMY  3 yrs ago  . Precancerous  Tissue - Nose  February 14-2014     Current Outpatient Prescriptions  Medication Sig Dispense Refill  . aspirin EC 81 MG EC tablet Take 1 tablet (81 mg total) by mouth daily.    Marland Kitchen atenolol (TENORMIN) 25 MG tablet Take 1 tablet by mouth 2 (two) times daily.    . cholecalciferol (VITAMIN D) 1000 units tablet Take 1,000 Units by mouth daily.    Marland Kitchen lactobacillus acidophilus (BACID) TABS tablet Take 1 tablet by mouth daily.     . meclizine (ANTIVERT) 25 MG tablet Take 1 tablet (25 mg total) by mouth 3 (three) times daily as needed for  dizziness. 60 tablet 1  . vancomycin (VANCOCIN HCL) 125 MG capsule Take 1 capsule (125 mg total) by mouth 3 (three) times daily. (Patient taking differently: Take 125 mg by mouth 4 (four) times daily. ) 42 capsule 0   No current facility-administered medications for this visit.     Allergies:   Prednisone    Social History:  The patient  reports that she has never smoked. She has never used smokeless tobacco. She reports that she does not drink alcohol or use drugs.   Family History:  The patient's family history includes Colon cancer in her mother; Lymphoma in her mother.    ROS: All other systems are reviewed and negative. Unless otherwise mentioned in H&P    PHYSICAL EXAM: VS:  There were no vitals taken for this visit. , BMI There is no height or weight on file to calculate BMI. GEN: Well nourished, well developed, in no acute distress  HEENT: normal  Neck: no JVD, carotid bruits, or masses Cardiac: ***RRR; no murmurs, rubs, or gallops,no edema  Respiratory:  clear to auscultation bilaterally, normal work of breathing GI: soft, nontender, nondistended, + BS MS: no deformity or atrophy  Skin: warm and dry, no rash Neuro:  Strength and sensation are intact Psych: euthymic mood, full affect   EKG:  EKG {ACTION; IS/IS [id number]} ordered today. The ekg ordered today demonstrates ***   Recent Labs: 06/15/2017: Hemoglobin 15.0; Platelets 209;  TSH 1.434 06/16/2017: Magnesium 1.7 06/18/2017: BUN 17; Creatinine, Ser 0.80; Potassium 4.0; Sodium 139    Lipid Panel No results found for: CHOL, TRIG, HDL, CHOLHDL, VLDL, LDLCALC, LDLDIRECT    Wt Readings from Last 3 Encounters:  06/22/17 133 lb 12.8 oz (60.7 kg)  06/15/17 136 lb (61.7 kg)  06/10/17 136 lb (61.7 kg)      Other studies Reviewed: Additional studies/ records that were reviewed today include: ***. Review of the above records demonstrates: ***   ASSESSMENT AND PLAN:  1.  ***   Current medicines are  reviewed at length with the patient today.    Labs/ tests ordered today include: *** Phill Myron. [place] Pugh, ANP, AACC   06/30/2017 2:24 PM    [facility] [address] [place], [place] [address] Phone: [phone]; Fax: [phone]

## 2017-07-01 ENCOUNTER — Other Ambulatory Visit (INDEPENDENT_AMBULATORY_CARE_PROVIDER_SITE_OTHER): Payer: Self-pay | Admitting: *Deleted

## 2017-07-01 DIAGNOSIS — R300 Dysuria: Secondary | ICD-10-CM

## 2017-07-01 DIAGNOSIS — R350 Frequency of micturition: Secondary | ICD-10-CM | POA: Diagnosis not present

## 2017-07-02 ENCOUNTER — Telehealth (INDEPENDENT_AMBULATORY_CARE_PROVIDER_SITE_OTHER): Payer: Self-pay | Admitting: Internal Medicine

## 2017-07-02 ENCOUNTER — Ambulatory Visit: Payer: Medicare Other | Admitting: Adult Health

## 2017-07-02 DIAGNOSIS — N390 Urinary tract infection, site not specified: Secondary | ICD-10-CM

## 2017-07-02 LAB — URINALYSIS, ROUTINE W REFLEX MICROSCOPIC
Bilirubin Urine: NEGATIVE
GLUCOSE, UA: NEGATIVE
HGB URINE DIPSTICK: NEGATIVE
HYALINE CAST: NONE SEEN /LPF
Ketones, ur: NEGATIVE
Nitrite: NEGATIVE
PROTEIN: NEGATIVE
RBC / HPF: NONE SEEN /HPF (ref 0–2)
Specific Gravity, Urine: 1.005 (ref 1.001–1.03)
pH: 5.5 (ref 5.0–8.0)

## 2017-07-02 MED ORDER — CIPROFLOXACIN HCL 500 MG PO TABS: 500.0000 mg | ORAL_TABLET | Freq: Two times a day (BID) | ORAL | 0 refills | Status: DC

## 2017-07-02 NOTE — Telephone Encounter (Signed)
Rx for Cipro sent to her pharmacy.

## 2017-07-04 ENCOUNTER — Telehealth: Payer: Self-pay | Admitting: Gastroenterology

## 2017-07-04 ENCOUNTER — Other Ambulatory Visit (HOSPITAL_COMMUNITY)
Admission: RE | Admit: 2017-07-04 | Discharge: 2017-07-04 | Disposition: A | Discharge status: Home / Self Care | Payer: Medicare Other | Source: Ambulatory Visit | Attending: Gastroenterology | Admitting: Gastroenterology

## 2017-07-04 DIAGNOSIS — R197 Diarrhea, unspecified: Secondary | ICD-10-CM | POA: Diagnosis not present

## 2017-07-04 LAB — C DIFFICILE QUICK SCREEN W PCR REFLEX
C DIFFICILE (CDIFF) INTERP: NOT DETECTED
C DIFFICILE (CDIFF) TOXIN: NEGATIVE
C DIFFICLE (CDIFF) ANTIGEN: NEGATIVE

## 2017-07-04 MED ORDER — VANCOMYCIN HCL 125 MG PO CAPS: 125.0000 mg | ORAL_CAPSULE | Freq: Four times a day (QID) | ORAL | 0 refills | Status: DC

## 2017-07-04 NOTE — Telephone Encounter (Signed)
PT WAS DOING WELL. STARTED ONCIPRO FOR UTI. LAST DOSE TODAY AND STARTED HAVING TNTC WATERY STOOLS. HAS SOME VANCOMYCIN LEFT AT HOME. RECOMMENDED VANC QID AND SUBMIT STOOL SAMPLE.  [facility] CLOSED ON SAT. PT SHOULD SUBMIT [place]. SPOKE TO PT. PT AWARE TO PICK UP STOOL SAMPLE CUP. DRINK WATER TO KEEP YOUR URINE LIGHT YELLOW. [name] IS AWARE & WILL DROP OFF SAMPLE CUP. INSTRUCTED PT'S HUSBAND TO COME TO ED REGISTRATION FOR ASSISTANCE IN PICKING UP THE CUP.

## 2017-07-05 NOTE — Telephone Encounter (Signed)
Stool C. difficile antigen and toxin are negative. Results given to patient. No diarrhea today.  She just had one bowel movement.. Given that she took Cipro she will continues vancomycin she has another 3 days supply.. Patient will send this progress report via my chart in 2 days. Will make an appointment for her to see urologist because of recurrent urinary tract infection.   She has had 3 infections this year.

## 2017-07-06 NOTE — Telephone Encounter (Signed)
appt with Dr Jeffie Pollock 07/08/17 at 230 ([place] [facility]), patient aware

## 2017-07-07 ENCOUNTER — Encounter (INDEPENDENT_AMBULATORY_CARE_PROVIDER_SITE_OTHER): Payer: Self-pay | Admitting: Internal Medicine

## 2017-07-07 ENCOUNTER — Encounter: Payer: Self-pay | Admitting: Cardiology

## 2017-07-07 ENCOUNTER — Encounter: Payer: Self-pay | Admitting: Neurology

## 2017-07-07 ENCOUNTER — Telehealth (INDEPENDENT_AMBULATORY_CARE_PROVIDER_SITE_OTHER): Payer: Self-pay | Admitting: Internal Medicine

## 2017-07-07 ENCOUNTER — Ambulatory Visit (INDEPENDENT_AMBULATORY_CARE_PROVIDER_SITE_OTHER): Payer: Medicare Other | Admitting: Cardiology

## 2017-07-07 VITALS — BP 139/86 | HR 57 | Wt 131.0 lb

## 2017-07-07 DIAGNOSIS — R519 Headache, unspecified: Secondary | ICD-10-CM

## 2017-07-07 DIAGNOSIS — R002 Palpitations: Secondary | ICD-10-CM

## 2017-07-07 DIAGNOSIS — I10 Essential (primary) hypertension: Secondary | ICD-10-CM

## 2017-07-07 DIAGNOSIS — R42 Dizziness and giddiness: Secondary | ICD-10-CM | POA: Diagnosis not present

## 2017-07-07 DIAGNOSIS — R51 Headache: Secondary | ICD-10-CM | POA: Diagnosis not present

## 2017-07-07 NOTE — Progress Notes (Signed)
Clinical Summary Rhonda Andrews is a 76 y.o.female seen today for follow up of the following medical problems.   1. Palpitations  - previous holter 05/2009 showed no significant arrhythmias.  - during recent admission short episode of PSVT - she was changed from propanolol to atenolol. With change had increase in symptoms and she changed herself back to propanolol   2. Headache/Dizziness - long history, has appt comint up with neuro. No clear cardiac etiology.     3. 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 HTN - fluctuating bp's during recent admission. Plasma metanephrines indeterminate, urine metanephrines normal - she stopped atenolol, restarted propanolol 60mg  daily.Since that time bp's have improved - she checks her bp at home multiple times a day - no caffeine, no EtOH.     Past Medical History:  Diagnosis Date  . Clostridium difficile diarrhea 02/05/2015  . 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 Lactose intolerance   . Palpitations      Allergies  Allergen Reactions  . Prednisone Other (See Comments)    Heart races     Current Outpatient Medications  Medication Sig Dispense Refill  . aspirin EC 81 MG EC tablet Take 1 tablet (81 mg total) by mouth daily.    Marland Kitchen atenolol (TENORMIN) 25 MG tablet Take 1 tablet by mouth 2 (two) times daily.    . cholecalciferol (VITAMIN D) 1000 units tablet Take 1,000 Units by mouth daily.    . ciprofloxacin (CIPRO) 500 MG tablet Take 1 tablet (500 mg total) by mouth 2 (two) times daily. 6 tablet 0  . lactobacillus acidophilus (BACID) TABS tablet Take 1 tablet by mouth daily.     . meclizine (ANTIVERT) 25 MG tablet Take 1 tablet (25 mg total) by mouth 3 (three) times daily as needed for dizziness. 60 tablet 1  . vancomycin (VANCOCIN HCL) 125 MG capsule Take 1 capsule (125 mg total) by mouth 3 (three) times daily. (Patient taking differently: Take 125 mg by mouth 4 (four) times daily. ) 42 capsule 0  . vancomycin (VANCOCIN HCL) 125 MG capsule Take  1 capsule (125 mg total) by mouth 4 (four) times daily. 48 capsule 0   No current facility-administered medications for this visit.      Past Surgical History:  Procedure Laterality Date  . ABDOMINAL HYSTERECTOMY     ovaries remained  . CHOLECYSTECTOMY  3 yrs ago  . Precancerous  Tissue - Nose  February 14-2014     Allergies  Allergen Reactions  . Prednisone Other (See Comments)    Heart races      Family History  Problem Relation Age of Onset  . Lymphoma Mother   . Colon cancer Mother      Social History Ms. Mishkin reports that  has never smoked. she has never used smokeless tobacco. Ms. Breeding reports that she does not drink alcohol.   Review of Systems CONSTITUTIONAL: No weight loss, fever, chills, weakness or fatigue.  HEENT: Eyes: No visual loss, blurred vision, double vision or yellow sclerae.No hearing loss, sneezing, congestion, runny nose or sore throat.  SKIN: No rash or itching.  CARDIOVASCULAR: per hpi RESPIRATORY: No shortness of breath, cough or sputum.  GASTROINTESTINAL: No anorexia, nausea, vomiting or diarrhea. No abdominal pain or blood.  GENITOURINARY: No burning on urination, no polyuria NEUROLOGICAL: +headache,dizziness MUSCULOSKELETAL: No muscle, back pain, joint pain or stiffness.  LYMPHATICS: No enlarged nodes. No history of splenectomy.  PSYCHIATRIC: No history of depression or anxiety.  ENDOCRINOLOGIC: No reports  of sweating, cold or heat intolerance. No polyuria or polydipsia.  Marland Kitchen   Physical Examination Vitals:   07/07/17 1003  BP: 139/86  Pulse: (!) 57  SpO2: 95%   Vitals:   07/07/17 1003  Weight: 131 lb (59.4 kg)    Gen: resting comfortably, no acute distress HEENT: no scleral icterus, pupils equal round and reactive, no palptable cervical adenopathy,  CV: RRR, no m/r/g, no jvd Resp: Clear to auscultation bilaterally GI: abdomen is soft, non-tender, non-distended, normal bowel sounds, no hepatosplenomegaly MSK:  extremities are warm, no edema.  Skin: warm, no rash Neuro:  no focal deficits Psych: appropriate affect   Diagnostic Studies     Assessment and Plan   1. Palpitations  - continue current beta blocker, currently symptoms are controlled.   2. Dizziness/Headaches - refer to neuro  3. HTN - seems to be normalizing back on propanolol. She will submit a bp log, pending results consider adding additional agent.  - I have asked her not to check her bp at home more than once a day, frequent checks have become a source and cycle of anxiety for her which may be driving up her bp's.   F/u 2 months  Arnoldo Lenis, M.D.

## 2017-07-07 NOTE — Patient Instructions (Signed)
Medication Instructions:  Your physician recommends that you continue on your current medications as directed. Please refer to the Current Medication list given to you today.   Labwork: NONE  Testing/Procedures: NONE  Follow-Up: Your physician recommends that you schedule a follow-up appointment in: 2 MONTHS.    Any Other Special Instructions Will Be Listed Below (If Applicable). Your physician has requested that you regularly monitor and record your blood pressure readings at home. Please use the same machine at the same time of day to check your readings and record them to bring to your follow-up visit.(1 WEEK)    You have been referred to NEUROLOGY.    If you need a refill on your cardiac medications before your next appointment, please call your pharmacy.

## 2017-07-07 NOTE — Telephone Encounter (Signed)
Patient presented to the office and wanted to let us know that she saw her heart doctor today and he is having her keep a log for a week then she'll decide about blood pressure medications.  She wanted to make Dr. Laural Golden aware.

## 2017-07-07 NOTE — Telephone Encounter (Signed)
Patient called, lmoam stating that Dr. Laural Golden told her that he would be in the office today and to let him know how she's doing.  Her diarrhea is better.  She does not feel that she can travel making appointments.  She stated that she sent a myChart message as well and for him to please review it.  She said it has Terri's name on it.  [phone]

## 2017-07-08 ENCOUNTER — Encounter: Payer: Self-pay | Admitting: Cardiology

## 2017-07-08 NOTE — Telephone Encounter (Signed)
Forwarded to [facility] for his review.

## 2017-07-09 NOTE — Telephone Encounter (Signed)
Noted; No information re diarrhea

## 2017-07-11 ENCOUNTER — Other Ambulatory Visit (INDEPENDENT_AMBULATORY_CARE_PROVIDER_SITE_OTHER): Payer: Self-pay | Admitting: Internal Medicine

## 2017-07-11 ENCOUNTER — Telehealth (INDEPENDENT_AMBULATORY_CARE_PROVIDER_SITE_OTHER): Payer: Self-pay | Admitting: Internal Medicine

## 2017-07-11 MED ORDER — NITROFURANTOIN MACROCRYSTAL 100 MG PO CAPS: 100.0000 mg | ORAL_CAPSULE | Freq: Two times a day (BID) | ORAL | 0 refills | Status: DC

## 2017-07-11 NOTE — Telephone Encounter (Signed)
Patient called.  She states she is drinking over a gallon of liquids.  She has dysuria. She has an appointment to see urologist on 08/28/2017. Treat patient with Macrobid 100 mg p.o. twice daily for 7 days. And advised to take Flagyl 250 mg p.o. 3 times daily while she is on antibiotic. Will do a urinalysis and culture on 07/20/2017.

## 2017-07-13 ENCOUNTER — Telehealth: Payer: Self-pay | Admitting: *Deleted

## 2017-07-13 ENCOUNTER — Encounter (INDEPENDENT_AMBULATORY_CARE_PROVIDER_SITE_OTHER): Payer: Self-pay | Admitting: *Deleted

## 2017-07-13 ENCOUNTER — Other Ambulatory Visit (INDEPENDENT_AMBULATORY_CARE_PROVIDER_SITE_OTHER): Payer: Self-pay | Admitting: *Deleted

## 2017-07-13 DIAGNOSIS — R300 Dysuria: Secondary | ICD-10-CM

## 2017-07-13 NOTE — Telephone Encounter (Signed)
Pt aware & voiced understanding 

## 2017-07-13 NOTE — Telephone Encounter (Signed)
Lab/Urins has been ordered and a letter has been sent to the patient as a reminder.

## 2017-07-13 NOTE — Telephone Encounter (Signed)
-----   Message from Arnoldo Lenis, MD sent at 07/13/2017  2:40 PM EST ----- Bp log reviewed and looks great, I don't see anything that would be causing her to feel bad, perhaps it is the ongoing infection. No med changes recommended at this time  Zandra Abts MD

## 2017-07-14 ENCOUNTER — Ambulatory Visit (INDEPENDENT_AMBULATORY_CARE_PROVIDER_SITE_OTHER): Payer: Medicare Other | Admitting: Urology

## 2017-07-14 DIAGNOSIS — N3000 Acute cystitis without hematuria: Secondary | ICD-10-CM | POA: Diagnosis not present

## 2017-07-17 ENCOUNTER — Encounter: Payer: Self-pay | Admitting: Family Medicine

## 2017-07-17 ENCOUNTER — Ambulatory Visit (INDEPENDENT_AMBULATORY_CARE_PROVIDER_SITE_OTHER): Payer: Medicare Other | Admitting: Family Medicine

## 2017-07-17 VITALS — HR 86 | Resp 16 | Ht 66.0 in | Wt 132.0 lb

## 2017-07-17 DIAGNOSIS — R42 Dizziness and giddiness: Secondary | ICD-10-CM

## 2017-07-17 DIAGNOSIS — I10 Essential (primary) hypertension: Secondary | ICD-10-CM | POA: Diagnosis not present

## 2017-07-17 DIAGNOSIS — E559 Vitamin D deficiency, unspecified: Secondary | ICD-10-CM | POA: Diagnosis not present

## 2017-07-17 DIAGNOSIS — Z780 Asymptomatic menopausal state: Secondary | ICD-10-CM | POA: Diagnosis not present

## 2017-07-17 DIAGNOSIS — H6982 Other specified disorders of Eustachian tube, left ear: Secondary | ICD-10-CM

## 2017-07-17 MED ORDER — FLUTICASONE PROPIONATE 50 MCG/ACT NA SUSP: 2.0000 | Freq: Every day | NASAL | 6 refills | Status: DC

## 2017-07-17 NOTE — Progress Notes (Signed)
Has appointment with neurology, 10/22/17 with [facility].  Seen by Dr. Harl Bowie and evaluated for dizzy/lightheadedness. No cardiac etiology.  Has appointment with Cardiology today. Medical mystery for years. Seen and followed at [facility].   Has been seen by Dr. Benjamine Mola for vertigo and tinnitus. Has another issue per her report. Feels like hearing in left ear is muffled. Feels like ear opens and closes and gets muffled and then clear.   Reports that feels non-functional and feels bad. Feels like BP is up and feels light headed. Feels washed out. Is not having any diarrhea. Feels tired. Worries a lot. Feels fatigued. Energy is low. Does not feel down or depressed. Feels frustrated that cannot feel better. Reports that constantly doing stuff and not one to sit down but now does not feel well to do things. Afraid to go anywhere b/c worries if drives might go over line and hurt someone while driving.

## 2017-07-17 NOTE — Progress Notes (Signed)
Patient ID: Rhonda Andrews, female    DOB: 21-Dec-1940, 76 y.o.   MRN: [id number]  Chief Complaint  Patient presents with  . Establish Care    Allergies Prednisone  Subjective:   Rhonda Andrews is a 76 y.o. female who presents to [facility] today.  HPI  Ms. Miano presents as a new patient visit to establish care.  Reports she is followed by cardiology and ENT.  Has had multiple recent appointments with specialist due to feeling of dizziness and lightheadedness.  Reports that she does have an upcoming appointment with neurology, 10/22/17,  with [facility] neurology group for evaluation of her lightheadedness.  She reports that she was recently seen by Dr. Harl Bowie and evaluated for dizzy/lightheadedness.  She reports that she was told by Dr. Harl Bowie that there was no cardiac etiology of her symptoms.  She reports that she is also been seen by Dr Benjamine Mola for this in the past.  Has been given meclizine for her symptoms.  Reports that she has a follow-up appointment with Cardiology today.  She tells me that she has been a medical mystery for years and has been seen and followed at [facility] for problems related to her GI tract and bacterial overgrowth in her gut in the past.  Feels like hearing in left ear is muffled. Feels like ear opens and closes and gets muffled and then clear.  Reports this is disturbing to her and bothers her.  Has never been on any nasal spray for her symptoms.  Does not take allergy medications.  Reports that she possibly could have allergies.  Reports that it is somewhat frustrating because she always has blood work done and it comes back normal but she feels non-functional and feels bad.  Throughout the day she reports that she feels like BP is up and and then will go down and feels light headed.  Complains of feeling washed out.  Reports that she is not having any diarrhea.  Denies any abdominal pain.  Complains that she feels tired and low energy.  However  reports that she never slows down and stays busy a lot.  Reports that she walks circles around her house and will walk over a mile and a half a day.  She reports that she does worry a lot. Does not feel down or depressed. Feels frustrated that cannot feel better.  Reports that at times she is afraid to go anywhere b/c worries she might get dizzy while driving and if drives might go over line and hurt someone while driving.  Reports that she is not diabetic but checks her blood sugars each day.  Reports that when she checks them her sugars usually run between 70 and 100.  Would like to go through and tell me how her day usually runs.  She reports she gets up in the mornings and goes out to eat and gets a fried egg.  Have some knots for a snack or something else healthy.  Reports that around 10:30 gets dizzy. Eats lunch at 11. Dizzy levels seem to subside and she feels better and functional.  Usually eats supper at 4:30. Does eat a snack in the afternoon.  Reports that then she feels pretty good the rest of the day.  Takes her propranolol at night.   She reports she does not want to get any blood work today that she has had lots of testing done lately and is not interested in getting her blood drawn  today but will at her follow-up.  She refuses to have her blood pressure checked because she is going to see Dr. Harl Bowie after this visit and would like for him to check it.  Reports she has had vitamin D deficiency in the past.  Gets her mammogram at [facility] in [place] and is scheduled to get that done.  Has never had a bone mineral density performed.  Is not interested in getting any immunizations today.    Past Medical History:  Diagnosis Date  . Anxiety   . Clostridium difficile diarrhea 02/05/2015  . 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 Headache   . Hypertension   . IBS (irritable bowel syndrome)   . Lactose intolerance   . Palpitation   . Palpitations     Past Surgical History:  Procedure Laterality  Date  . ABDOMINAL HYSTERECTOMY     ovaries remained  . CHOLECYSTECTOMY  3 yrs ago  . Precancerous  Tissue - Nose  February 14-2014    Family History  Problem Relation Age of Onset  . Lymphoma Mother   . Colon cancer Mother      Social History   Socioeconomic History  . Marital status: Married    Spouse name: None  . Number of children: 1  . Years of education: None  . Highest education level: None  Social Needs  . Financial resource strain: None  . Food insecurity - worry: None  . Food insecurity - inability: None  . Transportation needs - medical: None  . Transportation needs - non-medical: None  Occupational History  . None  Tobacco Use  . Smoking status: Never Smoker  . Smokeless tobacco: Never Used  Substance and Sexual Activity  . Alcohol use: No    Alcohol/week: 0.0 oz  . Drug use: No  . Sexual activity: None  Other Topics Concern  . None  Social History Narrative   Married. Has a daughter. 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    Grew up in [place].   Eats all food groups and has a great appetite.    Walks in house daily in her house, 1.5 miles a day.    Review of Systems  Constitutional: Negative for chills, diaphoresis, fever and unexpected weight change.  HENT: Positive for sinus pressure and tinnitus. Negative for facial swelling, rhinorrhea and sinus pain.        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  Has been seen and evaluated by audiology per her report. Ports she does feel some pressure in her left ear today.  Cardiovascular: Negative for chest pain, palpitations and leg swelling.  Gastrointestinal: Negative for abdominal pain, constipation, diarrhea, nausea and vomiting.  Genitourinary: Negative for frequency and urgency.  Musculoskeletal: Negative for gait problem and neck pain.  Neurological: Positive for dizziness and light-headedness. Negative for tremors, seizures, syncope, speech difficulty and weakness.       Denies  any current dizziness or lightheadedness.  Hematological: Negative for adenopathy. Does not bruise/bleed easily.  Psychiatric/Behavioral: Negative for decreased concentration, sleep disturbance and suicidal ideas. The patient is not nervous/anxious.      Objective:   Pulse 86   Resp 16   Ht 5\' 6"  (1.676 m)   Wt 132 lb (59.9 kg)   SpO2 95%   BMI 21.31 kg/m  Blood pressure deferred by patient Physical Exam  Constitutional: She is oriented to person, place, and time. She appears well-developed. No distress.  Thin active lady in no acute distress, with  a slightly anxious affect.  HENT:  Head: Normocephalic and atraumatic.  Right Ear: External ear normal.  Nose: Nose normal.  Mouth/Throat: Oropharynx is clear and moist. No oropharyngeal exudate.  Left eardrum retracted slightly.  Tympanic membranes clear bilaterally without injection or erythema.  Eyes: Conjunctivae and EOM are normal. Pupils are equal, round, and reactive to light. No scleral icterus.  Neck: Normal range of motion. Neck supple. No tracheal deviation present. No thyromegaly present.  Cardiovascular: Normal rate, regular rhythm and normal heart sounds.  Pulmonary/Chest: Effort normal and breath sounds normal. No stridor. No respiratory distress.  Abdominal: Soft. Bowel sounds are normal. She exhibits no distension. There is no tenderness.  Musculoskeletal: Normal range of motion. She exhibits no edema.  Neurological: She is alert and oriented to person, place, and time. No cranial nerve deficit. Coordination normal.  Skin: Skin is warm and dry. She is not diaphoretic. No erythema. No pallor.  Psychiatric: Her behavior is normal. Judgment and thought content normal. Her mood appears anxious. Her speech is not rapid and/or pressured. She is not agitated. Cognition and memory are normal. Cognition and memory are not impaired. She does not express inappropriate judgment. She expresses no suicidal ideation. She expresses no  suicidal plans. She exhibits normal recent memory and normal remote memory.  Vitals reviewed.    Assessment and Plan   1. Dysfunction of left eustachian tube Discussed with patient that her intermittent muffling of her ear with some congestion could be due to eustachian tube dysfunction.  She is not interested in trial of an antihistamine but reports that she would try a nasal steroid.Patient counseled in detail regarding the risks of medication. Told to call or return to clinic if develop any worrisome signs or symptoms. Patient voiced understanding.  Patient will give this a try for the next month and we will follow-up to discuss. - fluticasone (FLONASE) 50 MCG/ACT nasal spray; Place 2 sprays daily into both nostrils.  Dispense: 16 g; Refill: 6  2. Post-menopause Thin female who is postmenopausal at risk for osteoporosis.  Will order bone density at this time.  She will bring in her calcium and vitamin D supplement for me to review. - DG Bone Density; Future  3. Vitamin D deficiency We will plan to check vitamin D levels at her follow-up.  4. HTN, goal below 140/90 Recommend she continue propranolol per Dr. Harl Bowie and will plan on checking her blood pressure at her follow-up.  5. Light headedness Patient has had significant workup for her lightheaded symptoms.  There appears to be no apparent cardiac etiology for her symptoms.  Possibly secondary to hypoglycemia.  Patient eats hardly any carbohydrates because she is concerned about her blood sugars.  She will add some carbohydrates to her meals as part of a healthy diet.  She will also make sure she has a snack around 10 AM.  She will also follow-up with Dr. Benjamine Mola and neurology for evaluation of these symptoms. Office visit was greater than 45 minutes today. Immunizations deferred today by patient and labs deferred today by patient. Return in about 4 weeks (around 08/14/2017) for follow up BP. Caren Macadam, MD 07/17/2017

## 2017-07-20 ENCOUNTER — Telehealth (INDEPENDENT_AMBULATORY_CARE_PROVIDER_SITE_OTHER): Payer: Self-pay | Admitting: Internal Medicine

## 2017-07-20 NOTE — Telephone Encounter (Signed)
Patient stated that Dr. Laural Golden wanted her to have a urinalysis done, but [facility] said she has not had a UTI that last two times they said she did.  He checked it and it was negative and she did not think it needed to be redone.  [phone]

## 2017-07-20 NOTE — Telephone Encounter (Signed)
Dr.Rehman will be made aware. 

## 2017-07-21 ENCOUNTER — Telehealth: Payer: Self-pay

## 2017-07-21 MED ORDER — AMLODIPINE BESYLATE 2.5 MG PO TABS: 2.5000 mg | ORAL_TABLET | Freq: Every day | ORAL | 3 refills | Status: DC

## 2017-07-21 NOTE — Telephone Encounter (Signed)
-----   Message from Arnoldo Lenis, MD sent at 07/20/2017  2:57 PM EST ----- BP log reviwed and patient's note she dropped off removed. Occasional high bp's, please start norvasc 2.5mg  daily. Can we see if we can get her an earlier appt with neuro per her request.   J BrancH MD

## 2017-07-21 NOTE — Telephone Encounter (Signed)
Spoke with pt. Advised her of new medication. She voiced understanding.

## 2017-07-22 NOTE — Telephone Encounter (Signed)
Dr.Rehman is an agreement that the patient does not need to have repeat UA.

## 2017-07-28 ENCOUNTER — Telehealth: Payer: Self-pay

## 2017-07-28 NOTE — Telephone Encounter (Signed)
Patient walked into office today stating her BP has been running very low and she is very concerned. Patient brought in BP log 11/21 121/80 HR 70 11/22 98/65 HR 68 11/23 101/67 HR 65 11/24 85/58 HR 62 11/26 113/69 HR 67 11/27 95/64 HR 70  Patient states these BP were all taken around 8 am. In office today patients BP was 149/84. Patient brought home BP machine which compared to manual. Informed patient of normal BP and that most of her readings were normal. Patient stated that they were "too low" for her and she has just not felt right the past 2 weeks. Will send to Dr. Harl Bowie for further recommendations.

## 2017-07-28 NOTE — Telephone Encounter (Signed)
What time of day is she taking her norvasc? I would try having her take it before bed and see how her numbers look then   Zandra Abts MD

## 2017-07-29 NOTE — Telephone Encounter (Signed)
Unsafe and unreliable to oper her capsule and take the medication that way. May have something to do with why her bp's are up and down because the amount she gets each day is going to vary. She may do better with the short acting propanlol, that way she can take extra or hold doses if needed. I would suggest we stop her propanolol ER and start short acting propanolol 20mg  bid, may take additional 10 to 20 mg as needed for palpitations. This should be a tabled and can be cut if needed  Zandra Abts MD

## 2017-07-29 NOTE — Telephone Encounter (Signed)
Patient states she never started taking the norvasc because she felt like her BP was too low. Patient states she does not take the atenolol which is on last office visit and was confirmed on phone note on 10/23 and while in office yesterday. Patient states she is taking propanolol 60 mg daily. Patient states it is a capsule and she opens the capsule up and takes about 2/3 of the contents out, throws it away and only takes the remaining 1/3.

## 2017-07-30 MED ORDER — PROPRANOLOL HCL 20 MG PO TABS: 20.0000 mg | ORAL_TABLET | Freq: Two times a day (BID) | ORAL | 3 refills | Status: DC

## 2017-07-30 NOTE — Telephone Encounter (Signed)
Patient notified and verbalized. Patient states she feels this is a great idea. Rx sent to pharmacy.

## 2017-08-03 ENCOUNTER — Ambulatory Visit (INDEPENDENT_AMBULATORY_CARE_PROVIDER_SITE_OTHER): Payer: Medicare Other | Admitting: Otolaryngology

## 2017-08-04 ENCOUNTER — Ambulatory Visit: Payer: Medicare Other | Admitting: Cardiology

## 2017-08-06 ENCOUNTER — Telehealth: Payer: Self-pay

## 2017-08-06 NOTE — Telephone Encounter (Signed)
Neither a blood pressure of 101/64 or a blood pressure of 166/80 would typically be symptomatic and neither would cause dizzines. I would not be worried about a low bp unless bp is <95 on top or a high number if the top number was >175.  I would suggest she go back to the most recent propaonol we prescribed and try 20mg  tid and see how her numbers do.    Carlyle Dolly MD

## 2017-08-06 NOTE — Telephone Encounter (Signed)
Patient states she is still very dizzy and her BP is still giving her a problem. BP yesterday was 101/64. Patient states she began the new rx of propanolol for 4 days and it made her BP go up too high (166/80). Patient went back to the old prescription of propanolol. Patient states she is just very dizzy and feels she needs to be hospitalized since there are no available appointments with extender until January and with Dr. Harl Bowie until February. Patient states she is unable to function due to the dizziness.

## 2017-08-06 NOTE — Telephone Encounter (Signed)
Patient notified and states she will give it another try

## 2017-08-11 ENCOUNTER — Emergency Department (HOSPITAL_COMMUNITY)
Admission: EM | Admit: 2017-08-11 | Discharge: 2017-08-11 | Disposition: A | Discharge status: Home / Self Care | Payer: Medicare Other | Attending: Emergency Medicine | Admitting: Emergency Medicine

## 2017-08-11 ENCOUNTER — Encounter (HOSPITAL_COMMUNITY): Payer: Self-pay | Admitting: Emergency Medicine

## 2017-08-11 ENCOUNTER — Ambulatory Visit: Payer: Medicare Other | Admitting: Family Medicine

## 2017-08-11 ENCOUNTER — Other Ambulatory Visit: Payer: Self-pay

## 2017-08-11 DIAGNOSIS — Z5321 Procedure and treatment not carried out due to patient leaving prior to being seen by health care provider: Secondary | ICD-10-CM | POA: Diagnosis not present

## 2017-08-11 DIAGNOSIS — R42 Dizziness and giddiness: Secondary | ICD-10-CM | POA: Insufficient documentation

## 2017-08-11 HISTORY — DX: Dizziness and giddiness: R42

## 2017-08-11 NOTE — ED Notes (Signed)
Called by Network engineer that the pt left from lobby

## 2017-08-11 NOTE — ED Triage Notes (Signed)
Pt c/o dizziness x 15 years. Here in October for dizziness. Stats could not get a neurologist apt until January. Pt got up and walked to triage room without difficulty and with a steady gait. Pt states when she stands it is just dizziness and feels like she is going to pass out. A/o. Nad. Pt denies any others sx. Pt states when she takes her antivert it helps but makes her bp go up.

## 2017-08-12 ENCOUNTER — Telehealth: Payer: Self-pay | Admitting: Physician Assistant

## 2017-08-12 NOTE — Telephone Encounter (Signed)
Paged by answering service. Patient having frequent dizziness as previously address by Dr. Harl Bowie. She went to [facility] ER yesterday. However came home without being seen due to longer wait time. Requesting to seen by Dr. Harl Bowie today.   Seems she has appointment with neurology next month. Symptoms has worsen. She neesd MRI of neck/barin per some MD. Advised to go ER or call EMS for worsen symptoms.

## 2017-08-13 ENCOUNTER — Encounter: Payer: Self-pay | Admitting: Cardiology

## 2017-08-13 ENCOUNTER — Ambulatory Visit (INDEPENDENT_AMBULATORY_CARE_PROVIDER_SITE_OTHER): Payer: Medicare Other | Admitting: Cardiology

## 2017-08-13 VITALS — BP 161/83 | HR 81 | Ht 64.0 in | Wt 134.0 lb

## 2017-08-13 DIAGNOSIS — I10 Essential (primary) hypertension: Secondary | ICD-10-CM

## 2017-08-13 DIAGNOSIS — R42 Dizziness and giddiness: Secondary | ICD-10-CM | POA: Diagnosis not present

## 2017-08-13 DIAGNOSIS — R002 Palpitations: Secondary | ICD-10-CM

## 2017-08-13 MED ORDER — DILTIAZEM HCL 30 MG PO TABS: 30.0000 mg | ORAL_TABLET | Freq: Two times a day (BID) | ORAL | 6 refills | Status: DC

## 2017-08-13 NOTE — Progress Notes (Signed)
Clinical Summary Rhonda Andrews is a 76 y.o.female seen today for follow up of the following medical problems.   1. Palpitations  - previous holter 05/2009 showed no significant arrhythmias.  - during recent admission short episode of PSVT - she was changed from propanolol to atenolol. With change had increase in symptoms and she changed herself back to propanolol   - since last visit we found out she had been breaking open her propanolol capsule and taking only a portion of it so she could take a lower dose - we changed her to short acting propanolol at this time, was to take 20mg  tid.  - was to take norvasc before bed - dizziness better with antivert -   - increased cough with propanolol she reports - taking propanolol capsule and breaks open still, has not been taking the short acting propanolol tablet we had prescribed.  - tried propanolol 20mg  bid caused dizziness.     2. Headache/Dizziness - long history, has appt comint up with neuro. No clear cardiac etiology.     3. 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 HTN - fluctuating bp's during recent admission. Plasma metanephrines indeterminate, urine metanephrines normal - she stopped atenolol, restarted propanolol 60mg  daily.Since that time bp's have improved - she checks her bp at home multiple times a day - no caffeine, no EtOH.   - checking bp 3 times a day. Range 110-160s/50-80s.    Past Medical History:  Diagnosis Date  . Anxiety   . Clostridium difficile diarrhea 02/05/2015  . Dizziness   . 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 Headache   . Hypertension   . IBS (irritable bowel syndrome)   . Lactose intolerance   . Palpitation   . Palpitations      Allergies  Allergen Reactions  . Prednisone Other (See Comments)    Heart races     Current Outpatient Medications  Medication Sig Dispense Refill  . amLODipine (NORVASC) 2.5 MG tablet Take 1 tablet (2.5 mg total) by mouth daily. 180 tablet 3  . aspirin EC 81 MG  EC tablet Take 1 tablet (81 mg total) by mouth daily.    . cholecalciferol (VITAMIN D) 1000 units tablet Take 1,000 Units by mouth daily.    . fluticasone (FLONASE) 50 MCG/ACT nasal spray Place 2 sprays daily into both nostrils. 16 g 6  . lactobacillus acidophilus (BACID) TABS tablet Take 1 tablet by mouth daily.     . meclizine (ANTIVERT) 25 MG tablet Take 1 tablet (25 mg total) by mouth 3 (three) times daily as needed for dizziness. 60 tablet 1  . propranolol (INDERAL) 20 MG tablet Take 1 tablet (20 mg total) by mouth 2 (two) times daily. 60 tablet 3   No current facility-administered medications for this visit.      Past Surgical History:  Procedure Laterality Date  . ABDOMINAL HYSTERECTOMY     ovaries remained  . CHOLECYSTECTOMY  3 yrs ago  . Precancerous  Tissue - Nose  February 14-2014     Allergies  Allergen Reactions  . Prednisone Other (See Comments)    Heart races      Family History  Problem Relation Age of Onset  . Lymphoma Mother   . Colon cancer Mother      Social History Ms. Bradshaw reports that  has never smoked. she has never used smokeless tobacco. Ms. Erdmann reports that she does not drink alcohol.   Review of Systems CONSTITUTIONAL: No weight loss, fever,  chills, weakness or fatigue.  HEENT: Eyes: No visual loss, blurred vision, double vision or yellow sclerae.No hearing loss, sneezing, congestion, runny nose or sore throat.  SKIN: No rash or itching.  CARDIOVASCULAR: per hpi RESPIRATORY: No shortness of breath, cough or sputum.  GASTROINTESTINAL: No anorexia, nausea, vomiting or diarrhea. No abdominal pain or blood.  GENITOURINARY: No burning on urination, no polyuria NEUROLOGICAL: +dizziness MUSCULOSKELETAL: No muscle, back pain, joint pain or stiffness.  LYMPHATICS: No enlarged nodes. No history of splenectomy.  PSYCHIATRIC: No history of depression or anxiety.  ENDOCRINOLOGIC: No reports of sweating, cold or heat intolerance. No  polyuria or polydipsia.  Marland Kitchen   Physical Examination Vitals:   08/13/17 1253  BP: (!) 161/83  Pulse: 81  SpO2: 97%   Vitals:   08/13/17 1253  Weight: 134 lb (60.8 kg)  Height: 5\' 4"  (1.626 m)    Gen: resting comfortably, no acute distress HEENT: no scleral icterus, pupils equal round and reactive, no palptable cervical adenopathy,  CV: RRR, no m/r/g, no jvd Resp: Clear to auscultation bilaterally GI: abdomen is soft, non-tender, non-distended, normal bowel sounds, no hepatosplenomegaly MSK: extremities are warm, no edema.  Skin: warm, no rash Neuro:  no focal deficits Psych: appropriate affect    Assessment and Plan   1. Palpitations  - reports a cough on propanolol, we will try changing to diltizenm to 30mg  bid  2. Dizziness/Headaches - has appt with neuro, she is very anxious to be seen sooner. Will see if we can get an earlier appt.   3. HTN - she remains very focused on her home bp's, checking 3 times a day at least despite our previous conversations about checking at most once daily. I assured her that a SBP of 110-160 typically would not be symptomatic, and that I don't believe this is related to her dizziness episodes. Orthostatics are negative in clinic - follow bp's with dilt 30mg  bid      Arnoldo Lenis, M.D.

## 2017-08-13 NOTE — Patient Instructions (Addendum)
Medication Instructions:  Stop propranolol Start diltiazem 30 mg -two times daily    Labwork: none  Testing/Procedures: none  Follow-Up: Your physician recommends that you schedule a follow-up appointment in: 2 months    Any Other Special Instructions Will Be Listed Below (If Applicable).     If you need a refill on your cardiac medications before your next appointment, please call your pharmacy.

## 2017-08-17 ENCOUNTER — Other Ambulatory Visit: Payer: Self-pay

## 2017-08-17 ENCOUNTER — Encounter: Payer: Self-pay | Admitting: Family Medicine

## 2017-08-17 ENCOUNTER — Ambulatory Visit (INDEPENDENT_AMBULATORY_CARE_PROVIDER_SITE_OTHER): Payer: Medicare Other | Admitting: Family Medicine

## 2017-08-17 ENCOUNTER — Encounter: Payer: Self-pay | Admitting: Cardiology

## 2017-08-17 VITALS — BP 140/80 | HR 76 | Temp 98.8°F | Resp 16 | Ht 64.0 in | Wt 130.2 lb

## 2017-08-17 DIAGNOSIS — E538 Deficiency of other specified B group vitamins: Secondary | ICD-10-CM | POA: Diagnosis not present

## 2017-08-17 DIAGNOSIS — R739 Hyperglycemia, unspecified: Secondary | ICD-10-CM | POA: Diagnosis not present

## 2017-08-17 DIAGNOSIS — I10 Essential (primary) hypertension: Secondary | ICD-10-CM | POA: Diagnosis not present

## 2017-08-17 DIAGNOSIS — E559 Vitamin D deficiency, unspecified: Secondary | ICD-10-CM | POA: Diagnosis not present

## 2017-08-17 DIAGNOSIS — R42 Dizziness and giddiness: Secondary | ICD-10-CM

## 2017-08-17 NOTE — Progress Notes (Signed)
Patient ID: Rhonda Andrews, female    DOB: [birth year], 76 y.o.   MRN: [id number]  Chief Complaint  Patient presents with  . Follow-up    Allergies Prednisone  Subjective:   Rhonda Andrews is a 76 y.o. female who presents to [facility] today.  HPI Here for follow up. Reports that she has been seen by many doctors since she was last here, but must have not been seen by the right one yet because she is still dizzy. She reports that she was unable to take the flonase b/c it made her nose bleed. Still suffering from dizzy symptoms almost each day. Reports that yesterday she was not dizzy but it usually starts around 9:30 each day. She does have appointment with neurology in January. Reports that it was moved up from 10/2017.  Reports that each day around 9:30 starts feeling dizzy. Reports that then when she turns her head she feels dizzy. The room does not spin. It is "not the vertigo type dizzy, it is just dizzy, like head is in motion".  Reports that if she got up and was going to walk and keeps head down it is not as bad, but to look straight when walking it is an effort. Reports that dizzy symptoms make her feel weak. Feels like head darts forward and feels like is going to pass out, but does not pass out. No associated SOB, CP, sweating, or vomiting. Slight nausea with dizzy symptoms. Reports that has not started diltiazem that was given to her by Dr. Harl Bowie b/c she wanted to talk with me about it b/c she does not want anything that will give her diarrhea. She had no reason to suspect that it will cause her diarrhea other than the fact that most everything she eats from time to time causes her diarrhea. Reports that she has been followed by [facility] for this. Reports that she has not quit the propranolol ER and is continuing to open up the capsules and remove some of the beads to decrease her dose.  She reports that she knows she is not supposed to do it. She reports that she has  done it for 3 years and she believe she is measuring it correctly.  Reports that she weighs out the dose so that she knows how much she is getting of the medication Reports that because of the dizzy symptoms that she has not been leaving the house very much. Symptoms are worse upon standing and better if she stays still. Symptoms of dizzy started 15 years ago, mainly light headed but in 10/18 symptoms changed to dizzy and has persisted. Has been on propranolol for 15 years.  Patient reports that she has been concerned that her dizziness could be because because her magnesium levels were borderline.  She reports she is also been vitamin D deficient in the past and is taken prescription doses of vitamin D.  She reports she knows she needs to have a bone density performed but she is unable to tolerate this at this time due to the fact that she is suffering from these dizzy symptoms.    Dizziness  This is a chronic problem. The current episode started more than 1 year ago ("been dizzy for 15 years but worse over the past 2 months"). The problem occurs constantly. Pertinent negatives include no abdominal pain, anorexia, arthralgias, change in bowel habit, chest pain, chills, congestion, coughing, diaphoresis, fatigue, fever, headaches, joint swelling, myalgias, nausea, neck pain, numbness,  rash, urinary symptoms, visual change, vomiting or weakness. The symptoms are aggravated by standing and twisting. She has tried rest, sleep, position changes, lying down and eating for the symptoms. The treatment provided no relief.    Past Medical History:  Diagnosis Date  . Anxiety   . Clostridium difficile diarrhea 02/05/2015  . Dizziness   . 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 Headache   . Hypertension   . IBS (irritable bowel syndrome)   . Lactose intolerance   . Palpitation   . Palpitations     Past Surgical History:  Procedure Laterality Date  . ABDOMINAL HYSTERECTOMY     ovaries remained  . CHOLECYSTECTOMY  3 yrs  ago  . Precancerous  Tissue - Nose  February 14-2014    Family History  Problem Relation Age of Onset  . Lymphoma Mother   . Colon cancer Mother      Social History   Socioeconomic History  . Marital status: Married    Spouse name: None  . Number of children: 1  . Years of education: None  . Highest education level: None  Social Needs  . Financial resource strain: None  . Food insecurity - worry: None  . Food insecurity - inability: None  . Transportation needs - medical: None  . Transportation needs - non-medical: None  Occupational History  . None  Tobacco Use  . Smoking status: Never Smoker  . Smokeless tobacco: Never Used  Substance and Sexual Activity  . Alcohol use: No    Alcohol/week: 0.0 oz  . Drug use: No  . Sexual activity: None  Other Topics Concern  . None  Social History Narrative   Married. Has a daughter. 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    Grew up in [place].   Eats all food groups and has a great appetite.    Walks in house daily in her house, 1.5 miles a day.    Review of Systems  Constitutional: Negative for chills, diaphoresis, fatigue and fever.  HENT: Negative for congestion, ear discharge, ear pain, postnasal drip, sinus pressure, sinus pain, sneezing, trouble swallowing and voice change.   Eyes: Negative for visual disturbance.  Respiratory: Negative for cough, chest tightness and wheezing.   Cardiovascular: Negative for chest pain and leg swelling.  Gastrointestinal: Negative for abdominal pain, anorexia, change in bowel habit, nausea and vomiting.  Endocrine: Negative for polyphagia and polyuria.  Genitourinary: Negative for dysuria, hematuria and urgency.  Musculoskeletal: Negative for arthralgias, joint swelling, myalgias and neck pain.  Skin: Negative for rash.  Neurological: Positive for dizziness and light-headedness. Negative for tremors, seizures, syncope, facial asymmetry, speech difficulty, weakness, numbness and headaches.    Hematological: Negative for adenopathy. Does not bruise/bleed easily.  Psychiatric/Behavioral: Negative for agitation, behavioral problems and dysphoric mood.     Objective:   BP 140/80 (BP Location: Left Arm, Patient Position: Sitting, Cuff Size: Normal)   Pulse 76   Temp 98.8 F (37.1 C) (Other (Comment))   Resp 16   Ht 5\' 4"  (1.626 m)   Wt 130 lb 4 oz (59.1 kg)   SpO2 99%   BMI 22.36 kg/m   Physical Exam  Constitutional: She is oriented to person, place, and time. She appears well-developed and well-nourished. No distress.  HENT:  Head: Normocephalic and atraumatic.  Nose: Nose normal.  Mouth/Throat: Oropharynx is clear and moist.  Eyes: Pupils are equal, round, and reactive to light. No scleral icterus.  Neck: Trachea normal and normal range of motion. Neck supple. No JVD  present. Carotid bruit is not present. No tracheal deviation present. No thyroid mass and no thyromegaly present.  Cardiovascular: Normal rate, regular rhythm and normal heart sounds.  Pulmonary/Chest: Effort normal and breath sounds normal. No respiratory distress.  Abdominal: Soft.  Lymphadenopathy:    She has no cervical adenopathy.  Neurological: She is alert and oriented to person, place, and time. No cranial nerve deficit or sensory deficit. Coordination normal.  Skin: Skin is warm and dry. No rash noted.  Psychiatric: She has a normal mood and affect. Her behavior is normal. Thought content normal.  Nursing note and vitals reviewed.    Assessment and Plan  1. Dizzy Chronic history of lightheadedness and dizzy symptoms, worsened over the past several months.  Seen ENT and cardiology with negative workups.  MRI of the head performed in October 2018 which was within normal limits/reviewed today.  Upcoming appointment with neurology scheduled for January 2019.  At this time will obtain MRI of the neck to exclude this etiology.  Will check labs per patient request.  Patient was counseled concerning  worrisome signs and symptoms and if develop to the emergency department. - MR MRA NECK WO CONTRAST; Future - Magnesium - Basic metabolic panel  2. Low calcium levels Emergency department records reviewed and evidence of low calcium in the past.  Check levels today. - Calcium, ionized - Calcium - Parathyroid hormone, intact (no Ca)  3. Hyperglycemia History of elevated blood sugar on labs.  Check today to rule out for diabetes. - Hemoglobin A1c  4. Low vitamin B12 level B12 levels less than 400 on review of labs.  Start B12 1000 mcg a day.  5. Vitamin D deficiency Tria vitamin D deficiency and risk for osteoporosis.  Patient reports that she is unable to do a bone density test at this time due to her history of dizziness.  Will check labs.  Calcium supplementation reviewed today.  She is currently taking approximately 5-600 mg of calcium each day.  Appropriate levels of calcium discussed with patient. - VITAMIN D 25 Hydroxy (Vit-D Deficiency, Fractures)  6. Essential hypertension Compliance with medication as directed by cardiology was recommended and discussed with patient again today.  We discussed the fact that she should not be opening up the propranolol extended release capsules and emptying some of the beads out and putting the capsule back together and subsequently taking the capsule.  She reports that she weighs the medication but we did discuss the possibility of discrepancy with her dosing.  We discussed that this could contribute to her fluctuations in blood pressure.  She will consider whether she will continue doing this or not.  We did discuss that this would not be recommended. I did ask her to please take her medications as directed and follow back up as directed with cardiology for her blood pressure or follow-up in our office.  Return in about 4 weeks (around 09/14/2017) for Follow-up. Caren Macadam, MD 08/17/2017

## 2017-08-17 NOTE — Patient Instructions (Signed)
B12 1000 mcg tablet

## 2017-08-18 ENCOUNTER — Encounter: Payer: Self-pay | Admitting: Family Medicine

## 2017-08-18 LAB — HEMOGLOBIN A1C
Hgb A1c MFr Bld: 5.6 % of total Hgb (ref <5.7)
Mean Plasma Glucose: 114 (calc)
eAG (mmol/L): 6.3 (calc)

## 2017-08-18 LAB — BASIC METABOLIC PANEL WITH GFR
BUN/Creatinine Ratio: 23 (calc) — ABNORMAL HIGH (ref 6–22)
BUN: 22 mg/dL (ref 7–25)
CALCIUM: 9.2 mg/dL (ref 8.6–10.4)
CHLORIDE: 102 mmol/L (ref 98–110)
CO2: 31 mmol/L (ref 20–32)
Creat: 0.94 mg/dL — ABNORMAL HIGH (ref 0.60–0.93)
GFR, Est African American: 69 mL/min/{1.73_m2} (ref >=60)
GFR, Est Non African American: 60 mL/min/{1.73_m2} (ref >=60)
GLUCOSE: 111 mg/dL (ref 65–139)
POTASSIUM: 4.2 mmol/L (ref 3.5–5.3)
Sodium: 140 mmol/L (ref 135–146)

## 2017-08-18 LAB — PARATHYROID HORMONE, INTACT (NO CA): PTH: 55 pg/mL (ref 14–64)

## 2017-08-18 LAB — MAGNESIUM: MAGNESIUM: 1.9 mg/dL (ref 1.5–2.5)

## 2017-08-18 LAB — VITAMIN D 25 HYDROXY (VIT D DEFICIENCY, FRACTURES): VIT D 25 HYDROXY: 32 ng/mL (ref 30–100)

## 2017-08-18 LAB — CALCIUM, IONIZED: CALCIUM ION: 5.1 mg/dL (ref 4.8–5.6)

## 2017-08-27 ENCOUNTER — Ambulatory Visit (HOSPITAL_COMMUNITY): Payer: Medicare Other

## 2017-08-28 ENCOUNTER — Ambulatory Visit (HOSPITAL_COMMUNITY): Payer: Medicare Other

## 2017-09-10 ENCOUNTER — Ambulatory Visit (INDEPENDENT_AMBULATORY_CARE_PROVIDER_SITE_OTHER): Payer: Medicare Other | Admitting: Cardiology

## 2017-09-10 ENCOUNTER — Encounter: Payer: Self-pay | Admitting: Cardiology

## 2017-09-10 VITALS — BP 128/64 | HR 71 | Ht 64.0 in | Wt 136.0 lb

## 2017-09-10 DIAGNOSIS — I10 Essential (primary) hypertension: Secondary | ICD-10-CM

## 2017-09-10 DIAGNOSIS — R002 Palpitations: Secondary | ICD-10-CM

## 2017-09-10 MED ORDER — PROPRANOLOL HCL 20 MG PO TABS: 10.0000 mg | ORAL_TABLET | Freq: Two times a day (BID) | ORAL | 3 refills | Status: DC

## 2017-09-10 NOTE — Progress Notes (Signed)
Clinical Summary Rhonda Andrews is a 77 y.o.female seen today for follow up of the following medical problems.   1. Palpitations/HTN - previous holter 05/2009 showed no significant arrhythmias.  - during recent admission short episode of PSVT - she was changed from propanolol to atenolol. With change had increase in symptoms and she changed herself back to propanolol   - increased cough with propanolol she reports - taking propanolol capsule and breaks open still, has not been taking the short acting propanolol tablet we had prescribed.  - tried propanolol 20mg  bid caused dizziness.   - diltiazem caused low bp's at home per her report, she stopped taking.  - back on propanolol, still breaking open capsules.  - avg bp 110/65 HR 68 since last visit.   2.Headache/Dizziness -long history, has appt comint up with neuro. No clear cardiac etiology.    3. Vertigo - followed by ENT - upcoming appt with neuro     Past Medical History:  Diagnosis Date  . Anxiety   . Clostridium difficile diarrhea 02/05/2015  . Dizziness   . 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 Headache   . Hypertension   . IBS (irritable bowel syndrome)   . Lactose intolerance   . Palpitation   . Palpitations      Allergies  Allergen Reactions  . Prednisone Other (See Comments)    Heart races     Current Outpatient Medications  Medication Sig Dispense Refill  . calcium carbonate 1250 MG capsule Take 1,000 mg by mouth daily.    . cholecalciferol (VITAMIN D) 1000 units tablet Take 1,000 Units by mouth daily. 1/2 tablet each day    . diltiazem (CARDIZEM) 30 MG tablet Take 1 tablet (30 mg total) by mouth 2 (two) times daily. (Patient not taking: Reported on 08/17/2017) 60 tablet 6  . fluticasone (FLONASE) 50 MCG/ACT nasal spray Place 2 sprays daily into both nostrils. (Patient not taking: Reported on 08/13/2017) 16 g 6  . lactobacillus acidophilus (BACID) TABS tablet Take 1 tablet by mouth daily.     .  meclizine (ANTIVERT) 25 MG tablet Take 1 tablet (25 mg total) by mouth 3 (three) times daily as needed for dizziness. 60 tablet 1  . propranolol (INDERAL) 60 MG tablet 60 mg.     No current facility-administered medications for this visit.      Past Surgical History:  Procedure Laterality Date  . ABDOMINAL HYSTERECTOMY     ovaries remained  . CHOLECYSTECTOMY  3 yrs ago  . Precancerous  Tissue - Nose  February 14-2014     Allergies  Allergen Reactions  . Prednisone Other (See Comments)    Heart races      Family History  Problem Relation Age of Onset  . Lymphoma Mother   . Colon cancer Mother      Social History Rhonda Andrews reports that  has never smoked. she has never used smokeless tobacco. Rhonda Andrews reports that she does not drink alcohol.   Review of Systems CONSTITUTIONAL: No weight loss, fever, chills, weakness or fatigue.  HEENT: Eyes: No visual loss, blurred vision, double vision or yellow sclerae.No hearing loss, sneezing, congestion, runny nose or sore throat.  SKIN: No rash or itching.  CARDIOVASCULAR: per hpi RESPIRATORY: No shortness of breath, cough or sputum.  GASTROINTESTINAL: No anorexia, nausea, vomiting or diarrhea. No abdominal pain or blood.  GENITOURINARY: No burning on urination, no polyuria NEUROLOGICAL: No headache, dizziness, syncope, paralysis, ataxia, numbness or tingling in the extremities. No  change in bowel or bladder control.  MUSCULOSKELETAL: No muscle, back pain, joint pain or stiffness.  LYMPHATICS: No enlarged nodes. No history of splenectomy.  PSYCHIATRIC: No history of depression or anxiety.  ENDOCRINOLOGIC: No reports of sweating, cold or heat intolerance. No polyuria or polydipsia.  Marland Kitchen   Physical Examination Vitals:   09/10/17 1321  BP: 128/64  Pulse: 71  SpO2: 93%   Vitals:   09/10/17 1321  Weight: 136 lb (61.7 kg)  Height: 5\' 4"  (1.626 m)    Gen: resting comfortably, no acute distress HEENT: no scleral  icterus, pupils equal round and reactive, no palptable cervical adenopathy,  CV: RRR, no m/r/g, no jvd Resp: Clear to auscultation bilaterally GI: abdomen is soft, non-tender, non-distended, normal bowel sounds, no hepatosplenomegaly MSK: extremities are warm, no edema.  Skin: warm, no rash Neuro:  no focal deficits Psych: appropriate affect   Diagnostic Studies     Assessment and Plan  1. Palpitations/HTN - management difficult due to medication side effects. Did not tolerate atenolol or diltiazem - she contnues to break open her propanol capsule and adjust doses on her own despite our discussions - recommend trying short acting propanlol 10mg  bid, can take additional 10mg  prn.  - discussed again that none of her report bp's would be significant enough to be symptomatic. F/u with neuro regarding her headaches and dizziness       Rhonda Andrews, M.D.

## 2017-09-10 NOTE — Patient Instructions (Signed)
Medication Instructions:  STOP PROPRANOLOL (LONG ACTING)  TAKE PROPRANOLOL 10 MG TWO TIMES DAILY   Labwork: NONE  Testing/Procedures: NONE  Follow-Up: Your physician recommends that you schedule a follow-up appointment in: 4 MONTHS    Any Other Special Instructions Will Be Listed Below (If Applicable).     If you need a refill on your cardiac medications before your next appointment, please call your pharmacy.

## 2017-09-14 ENCOUNTER — Encounter: Payer: Self-pay | Admitting: Cardiology

## 2017-09-18 ENCOUNTER — Telehealth: Payer: Self-pay | Admitting: Family Medicine

## 2017-09-18 NOTE — Telephone Encounter (Signed)
Patient left message that she was not feeling well, that she did not know if it was dehydration but she felt 'washed out.' She states she has been drinking a lot of water. She states she has a neuro appointment on Tuesday that she has been waiting for but if she feels like this, she is not going.  Per Dr. Mannie Stabile, patient needs to make it to that appointment, so I can advised ED if necessary.   Patient informed of message, verbalized understanding.

## 2017-09-22 ENCOUNTER — Ambulatory Visit (INDEPENDENT_AMBULATORY_CARE_PROVIDER_SITE_OTHER): Payer: Medicare Other | Admitting: Neurology

## 2017-09-22 ENCOUNTER — Encounter: Payer: Self-pay | Admitting: Neurology

## 2017-09-22 VITALS — BP 151/78 | HR 70 | Ht 64.0 in | Wt 135.0 lb

## 2017-09-22 DIAGNOSIS — R42 Dizziness and giddiness: Secondary | ICD-10-CM

## 2017-09-22 DIAGNOSIS — H8149 Vertigo of central origin, unspecified ear: Secondary | ICD-10-CM | POA: Diagnosis not present

## 2017-09-22 DIAGNOSIS — H814 Vertigo of central origin: Secondary | ICD-10-CM

## 2017-09-22 NOTE — Patient Instructions (Signed)
Vestibular therapy

## 2017-09-22 NOTE — Progress Notes (Signed)
[facility]    Provider:  Dr Jaynee Eagles Referring Provider: [facility] Primary Care Physician:  Caren Macadam, MD  CC:  Dizziness for 50 years, worse the last 15 years.  HPI:  Rhonda Andrews is a 77 y.o. female here as a referral from Dr. Donavan Foil for dizziness for 50 years. PMHx dizziness for 50 years, HTN, vertigo, palpitations,anxiety, headache, IBS. Worse the last 15 years. She has tinnitus and allergy problems. When she was 77 years old she fell, she hit her head, she wasn;t dizzy then. Started being dizzy along with onset of allergies. Started feeling dizzy 50 years When she gets up in the morning, she is fine she gets dizzy when she moves around, more lightheaded, uncomfortable, feels like she is going to pass out. She may go several days and be fine but then it comes back. She stays well hydrated. Head movements will make her feel dizzy if she does it multiple times, if she is sitting side by side and she moves her eyes to the left, it bothers her, She denies headaches or migraines.   Reviewed notes, labs and imaging from outside physicians, which showed:   MRI brain, personally reviewed images and agree with the following: No acute intracranial abnormality, and largely unremarkable for age noncontrast MRI appearance of the brain.  Patient seen in the ED for dizziness x 15 years. In the ED, patient got up and walked to triage without problems, states that when she gets up she feels like she is going to pass out, ativert helps.   She sees cardiology for palpitations, Holters have been negative,she is on propranolol, dizziness better with antivert, follows with ENT for vertigo, has HTN  Per pcp notes, Each day around 930 she starts feeling dizzy. Not vertigo, is an effor to look straight ahead walking, makes her feel weak.  Review of Systems: Patient complains of symptoms per HPI as well as the following symptoms: flushing, dizziness. Pertinent  negatives and positives per HPI. All others negative.   Social History   Socioeconomic History  . Marital status: Married    Spouse name: Not on file  . Number of children: 1  . Years of education: Not on file  . Highest education level: Some college, no degree  Social Needs  . Financial resource strain: Not on file  . Food insecurity - worry: Not on file  . Food insecurity - inability: Not on file  . Transportation needs - medical: Not on file  . Transportation needs - non-medical: Not on file  Occupational History  . Not on file  Tobacco Use  . Smoking status: Never Smoker  . Smokeless tobacco: Never Used  Substance and Sexual Activity  . Alcohol use: No    Alcohol/week: 0.0 oz  . Drug use: No  . Sexual activity: Not on file  Other Topics Concern  . Not on file  Social History Narrative   Married. Has a daughter. 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    Grew up in [place].   Eats all food groups and has a great appetite.    Walks in house daily in her house, 1.5 miles a day.   Right handed   Quit drinking caffeine 15 years ago    Family History  Problem Relation Age of Onset  . Lymphoma Mother   . Colon cancer Mother     Past Medical History:  Diagnosis Date  . Anxiety   . Clostridium difficile diarrhea 02/05/2015  . Dizziness   .  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 Headache   . Hypertension   . IBS (irritable bowel syndrome)   . Lactose intolerance   . Palpitation   . Palpitations     Past Surgical History:  Procedure Laterality Date  . ABDOMINAL HYSTERECTOMY     ovaries remained  . CHOLECYSTECTOMY  3 yrs ago  . Precancerous  Tissue - Nose  February 14-2014    Current Outpatient Medications  Medication Sig Dispense Refill  . calcium carbonate 1250 MG capsule Take 1,000 mg by mouth daily.    . cholecalciferol (VITAMIN D) 1000 units tablet Take 1,000 Units by mouth daily. 1/2 tablet each day    . fluticasone (FLONASE) 50 MCG/ACT nasal spray Place 2 sprays daily  into both nostrils. 16 g 6  . lactobacillus acidophilus (BACID) TABS tablet Take 1 tablet by mouth daily.     . meclizine (ANTIVERT) 25 MG tablet Take 1 tablet (25 mg total) by mouth 3 (three) times daily as needed for dizziness. 60 tablet 1  . propranolol (INDERAL) 20 MG tablet Take 0.5 tablets (10 mg total) by mouth 2 (two) times daily. 90 tablet 3   No current facility-administered medications for this visit.     Allergies as of 09/22/2017 - Review Complete 09/22/2017  Allergen Reaction Noted  . Prednisone Other (See Comments) 10/28/2014    Vitals: BP (!) 151/78 (BP Location: Right Arm, Patient Position: Sitting) Comment: pt states it was 106/68 at home this afternoon before visit  Pulse 70   Ht 5\' 4"  (1.626 m)   Wt 135 lb (61.2 kg)   BMI 23.17 kg/m  Last Weight:  Wt Readings from Last 1 Encounters:  09/22/17 135 lb (61.2 kg)   Last Height:   Ht Readings from Last 1 Encounters:  09/22/17 5\' 4"  (1.626 m)   Physical exam: Exam: Gen: NAD, conversant, well nourised,  well groomed                     CV: RRR, no MRG. No Carotid Bruits. No peripheral edema, warm, nontender Eyes: Conjunctivae clear without exudates or hemorrhage  Neuro: Detailed Neurologic Exam  Speech:    Speech is normal; fluent and spontaneous with normal comprehension.  Cognition:    The patient is oriented to person, place, and time;     recent and remote memory intact;     language fluent;     normal attention, concentration,     fund of knowledge Cranial Nerves:    The pupils are equal, round, and reactive to light. The fundi are normal and spontaneous venous pulsations are present. Visual fields are full to finger confrontation. Extraocular movements are intact. Trigeminal sensation is intact and the muscles of mastication are normal. The face is symmetric. The palate elevates in the midline. Hearing intact. Voice is normal. Shoulder shrug is normal. The tongue has normal motion without  fasciculations.   Coordination:    Normal finger to nose and heel to shin. Normal rapid alternating movements.   Gait:    Heel-toe and tandem gait are normal.   Motor Observation:    No asymmetry, no atrophy, and no involuntary movements noted. Tone:    Normal muscle tone.    Posture:    Posture is normal. normal erect    Strength:    Strength is V/V in the upper and lower limbs.      Sensation: intact to LT     Reflex Exam:  DTR's:    Deep tendon  reflexes in the upper and lower extremities are normal bilaterally.   Toes:    The toes are downgoing bilaterally.   Clonus:    Clonus is absent.       Assessment/Plan:   77 y.o. female here as a referral from Dr. Donavan Foil for dizziness for 50 years. PMHx dizziness for 50 years, HTN, vertigo, palpitations,anxiety, headache, IBS. Worsening last 15 years. MRI brain unremarkable. 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 Patient to go to vestibular therapy and f/u as needed.   Orders Placed This Encounter  Procedures  . Ambulatory referral to Physical Therapy     Sarina Ill, MD  [facility] [address] [place], [place] [address]  Phone [phone] Fax [phone]

## 2017-09-24 DIAGNOSIS — G441 Vascular headache, not elsewhere classified: Secondary | ICD-10-CM | POA: Diagnosis not present

## 2017-09-24 DIAGNOSIS — M9902 Segmental and somatic dysfunction of thoracic region: Secondary | ICD-10-CM | POA: Diagnosis not present

## 2017-09-24 DIAGNOSIS — M546 Pain in thoracic spine: Secondary | ICD-10-CM | POA: Diagnosis not present

## 2017-09-24 DIAGNOSIS — M9901 Segmental and somatic dysfunction of cervical region: Secondary | ICD-10-CM | POA: Diagnosis not present

## 2017-09-24 DIAGNOSIS — M9903 Segmental and somatic dysfunction of lumbar region: Secondary | ICD-10-CM | POA: Diagnosis not present

## 2017-09-24 DIAGNOSIS — S338XXA Sprain of other parts of lumbar spine and pelvis, initial encounter: Secondary | ICD-10-CM | POA: Diagnosis not present

## 2017-09-28 ENCOUNTER — Other Ambulatory Visit: Payer: Self-pay

## 2017-09-28 ENCOUNTER — Ambulatory Visit (INDEPENDENT_AMBULATORY_CARE_PROVIDER_SITE_OTHER): Payer: Medicare Other | Admitting: Family Medicine

## 2017-09-28 ENCOUNTER — Encounter: Payer: Self-pay | Admitting: Family Medicine

## 2017-09-28 VITALS — BP 142/68 | HR 71 | Temp 98.6°F | Resp 17 | Ht 64.0 in | Wt 135.8 lb

## 2017-09-28 DIAGNOSIS — Z1231 Encounter for screening mammogram for malignant neoplasm of breast: Secondary | ICD-10-CM | POA: Diagnosis not present

## 2017-09-28 DIAGNOSIS — Z780 Asymptomatic menopausal state: Secondary | ICD-10-CM

## 2017-09-28 DIAGNOSIS — Z1239 Encounter for other screening for malignant neoplasm of breast: Secondary | ICD-10-CM

## 2017-09-28 DIAGNOSIS — I10 Essential (primary) hypertension: Secondary | ICD-10-CM | POA: Diagnosis not present

## 2017-09-28 MED ORDER — PROPRANOLOL HCL ER 60 MG PO CP24: 60.0000 mg | ORAL_CAPSULE | Freq: Every day | ORAL | 1 refills | Status: DC

## 2017-09-28 NOTE — Progress Notes (Signed)
Patient ID: Rhonda Andrews, female    DOB: 11-29-1940, 77 y.o.   MRN: [id number]  Chief Complaint  Patient presents with  . Follow-up    Allergies Prednisone  Subjective:   Rhonda Andrews is a 77 y.o. female who presents to [facility] today.  HPI Here for follow up. Has been doing much better. Reports that feels better, believe that she had pneumonia and is finally feeling better. No wheezing. Appetite is good. Energy is better. Sleeping well. Not feeling as dizzy as she was in the past but reports that when she gets the C. Difficile that it wears her out. Had C. Difficile in 10/18 but not since then. Wants a refill of the propranolol. Takes 1/2 dose of the pill. BP has been running well. Taking the propranolol each day. Feeling much better. Getting ready to go to vestibular therapy for dizzy. Does not want flu shot or pneumonia shot. Husband got GBS from flu shot. Feels the best that she has felt in years.       Past Medical History:  Diagnosis Date  . Anxiety   . Clostridium difficile diarrhea 02/05/2015  . Dizziness   . 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 Headache   . Hypertension   . IBS (irritable bowel syndrome)   . Lactose intolerance   . Palpitation   . Palpitations     Past Surgical History:  Procedure Laterality Date  . ABDOMINAL HYSTERECTOMY     ovaries remained  . CHOLECYSTECTOMY  3 yrs ago  . Precancerous  Tissue - Nose  February 14-2014    Family History  Problem Relation Age of Onset  . Lymphoma Mother   . Colon cancer Mother      Social History   Socioeconomic History  . Marital status: Married    Spouse name: None  . Number of children: 1  . Years of education: None  . Highest education level: Some college, no degree  Social Needs  . Financial resource strain: None  . Food insecurity - worry: None  . Food insecurity - inability: None  . Transportation needs - medical: None  . Transportation needs - non-medical: None    Occupational History  . None  Tobacco Use  . Smoking status: Never Smoker  . Smokeless tobacco: Never Used  Substance and Sexual Activity  . Alcohol use: No    Alcohol/week: 0.0 oz  . Drug use: No  . Sexual activity: None  Other Topics Concern  . None  Social History Narrative   Married. Has a daughter. 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    Grew up in [place].   Eats all food groups and has a great appetite.    Walks in house daily in her house, 1.5 miles a day.   Right handed   Quit drinking caffeine 15 years ago    Review of Systems  Constitutional: Negative for diaphoresis and fatigue.  Respiratory: Negative for choking and chest tightness.   Cardiovascular: Negative for chest pain and leg swelling.  Gastrointestinal: Negative for diarrhea and nausea.  Musculoskeletal: Negative for arthralgias.  Neurological: Negative for syncope, weakness, light-headedness and headaches.  Hematological: Negative for adenopathy. Does not bruise/bleed easily.  Psychiatric/Behavioral: Negative for dysphoric mood. The patient is not nervous/anxious.      Objective:   BP (!) 142/68 (BP Location: Left Arm, Patient Position: Sitting, Cuff Size: Normal)   Pulse 71   Temp 98.6 F (37 C) (Temporal)   Resp 17   Ht  5\' 4"  (1.626 m)   Wt 135 lb 12 oz (61.6 kg)   SpO2 98%   BMI 23.30 kg/m   Physical Exam  Constitutional: She appears well-developed and well-nourished.  HENT:  Head: Normocephalic and atraumatic.  Neck: Normal range of motion. Neck supple.  Cardiovascular: Normal rate and regular rhythm.  Pulmonary/Chest: Effort normal and breath sounds normal.  Skin: Skin is warm and dry.  Psychiatric: She has a normal mood and affect. Her behavior is normal. Judgment and thought content normal.  Vitals reviewed.    Assessment and Plan  1. Essential hypertension Stable. Improved.  Refill  Medications. Continue to monitor BP. Follow up with Dr. Harl Bowie as directed.  - propranolol ER  (INDERAL LA) 60 MG 24 hr capsule; Take 1 capsule (60 mg total) by mouth daily.  Dispense: 90 capsule; Refill: 1  2. Screening for breast cancer -Ordered.  - MM Digital Screening; Future  3. Post-menopausal -Ordered.  - DG Bone Density; Future -Continue the Vitamin D 2000 IU each day and Calcium 600 mg bid.   Return in about 3 months (around 12/27/2017) for follow up. Caren Macadam, MD 09/28/2017

## 2017-10-07 ENCOUNTER — Ambulatory Visit: Payer: Medicare Other | Attending: Neurology | Admitting: Physical Therapy

## 2017-10-07 ENCOUNTER — Encounter: Payer: Self-pay | Admitting: Physical Therapy

## 2017-10-07 DIAGNOSIS — R2681 Unsteadiness on feet: Secondary | ICD-10-CM | POA: Diagnosis not present

## 2017-10-07 DIAGNOSIS — R42 Dizziness and giddiness: Secondary | ICD-10-CM | POA: Diagnosis not present

## 2017-10-07 NOTE — Therapy (Signed)
[facility] [address] [place], [place], [address] Phone: [phone]   Fax:  [phone]  Physical Therapy Evaluation  Patient Details  Name: Rhonda Andrews MRN: [id number] Date of Birth: [birth year] Referring Provider: Dr. Sarina Ill   Encounter Date: 10/07/2017  PT End of Session - 10/07/17 1348    Visit Number  1    Number of Visits  6    Date for PT Re-Evaluation  11/18/17    Authorization Type  Medicare/[employer]    PT Start Time  1258    PT Stop Time  1344    PT Time Calculation (min)  46 min    Activity Tolerance  Patient tolerated treatment well    Behavior During Therapy  [facility] for tasks assessed/performed       Past Medical History:  Diagnosis Date  . Anxiety   . Clostridium difficile diarrhea 02/05/2015  . Dizziness   . 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 Headache   . Hypertension   . IBS (irritable bowel syndrome)   . Lactose intolerance   . Palpitation   . Palpitations     Past Surgical History:  Procedure Laterality Date  . ABDOMINAL HYSTERECTOMY     ovaries remained  . CHOLECYSTECTOMY  3 yrs ago  . Precancerous  Tissue - Nose  February 14-2014    There were no vitals filed for this visit.   Subjective Assessment - 10/07/17 1258    Subjective  Pt is a 77 y/o female who presents to OPPT for vertigo, with worsening symtoms since Oct 2018.  Pt reports low BP in Oct, followed by significantly elevated BP, went to ED and no known cause for BP.  Pt describes symptoms as lightheaded which has been present for many years, and has difficulty with motion sickness "since I was born."      Patient Stated Goals  "get rid of drunk feeling"    Currently in Pain?  No/denies occasional neck pain         [facility] PT Assessment - 10/07/17 1302      Assessment   Medical Diagnosis  vertigo    Referring Provider  Dr. Sarina Ill    Onset Date/Surgical Date  -- "decades" with exacerbation in Oct 2018    Next MD Visit  not scheduled (thinks she may need to follow up in May 2019)    Prior Therapy  none for this condition      Precautions   Precautions  None      Restrictions   Weight Bearing Restrictions  No      Balance Screen   Has the patient fallen in the past 6 months  No    Has the patient had a decrease in activity level because of a fear of falling?   Yes    Is the patient reluctant to leave their home because of a fear of falling?   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; perform grave digging    Leisure  gardening, Biomedical scientist, walking daily      Cognition   Overall Cognitive Status  Within Functional Limits for tasks assessed      Observation/Other Assessments   Focus on Therapeutic Outcomes (FOTO)   56 (44% limited; predicted 25% limited)      Posture/Postural Control   Posture/Postural Control  Postural limitations    Postural Limitations  Rounded Shoulders;Forward head      Ambulation/Gait   Gait Comments  mild staggering noted with head turns with amb, but no LOB noted; otherwise gait WNL      High Level Balance   High Level Balance Comments  on compliant surface: able to stand with feet apart and EC; LOB after 5 sec with feet together and EC         Vestibular Assessment - 10/07/17 1307      Vestibular Assessment   General Observation  no symptoms at rest      Symptom Behavior   Type of Dizziness  Lightheadedness motion sensitivity, imbalance, "my head's 1 step ahead of me    Frequency of Dizziness  intermittent:  with activity    Duration of Dizziness  2-3 days    Aggravating Factors  -- horizontal head motions (L>R); standing still looking up    Relieving Factors  Rest;Lying supine sit down      Occulomotor Exam   Occulomotor Alignment  Normal    Spontaneous  Absent    Gaze-induced  Absent    Smooth Pursuits  Intact increase in symptoms    Saccades  Intact      Vestibulo-Occular Reflex   VOR 1 Head Only (x 1 viewing)  intact; increase in symptoms with vertical VOR    Comment  head impulse test positive to Lt      Positional Testing   Sidelying Test  Sidelying Right;Sidelying Left    Horizontal Canal Testing  Horizontal Canal Right;Horizontal Canal Left      Sidelying Right   Sidelying Right Duration  none    Sidelying Right Symptoms  No nystagmus      Sidelying Left   Sidelying Left Duration  none    Sidelying Left Symptoms  No nystagmus      Horizontal Canal Right   Horizontal Canal Right Duration  none    Horizontal Canal Right Symptoms  Normal      Horizontal Canal Left   Horizontal Canal Left Duration  none    Horizontal Canal Left Symptoms  Normal         Objective measurements completed on examination: See above findings.       Vestibular Treatment/Exercise - 10/07/17 1346      Vestibular Treatment/Exercise   Vestibular Treatment Provided  Gaze    Gaze Exercises  X1 Viewing Horizontal;X1 Viewing Vertical      X1 Viewing Horizontal   Foot Position  seated    Time  -- 10 sec; limited time per pt request due to driving    Reps  1      X1 Viewing Vertical   Foot Position  seated    Time  -- 10 sec; limited time per pt request due to driving    Reps  1         Balance Exercises - 10/07/17 1347      Balance Exercises: Standing   Standing Eyes Closed  Foam/compliant surface;Narrow base of support (BOS);1 rep;20 secs  PT Education - 10/07/17 1347    Education provided  Yes    Education Details  HEP, clinical findings, adjustments to  glasses/impacts of vision on balance    Person(s) Educated  Patient    Methods  Explanation;Demonstration;Handout    Comprehension  Verbalized understanding;Returned demonstration;Need further instruction          PT Long Term Goals - 10/07/17 1352      PT LONG TERM GOAL #1   Title  independent with HEP    Status  New    Target Date  11/18/17      PT LONG TERM GOAL #2   Title  tolerate EC on compliant surface with feet together for > 30 sec for improved balance    Status  New    Target Date  11/18/17      PT LONG TERM GOAL #3   Title  report 50% improvement in symptoms for improved function    Status  New    Target Date  11/18/17      PT LONG TERM GOAL #4   Title  amb > 350' on various indoor/outdoor surfaces performing head turns without gait deviations or symptoms    Status  New    Target Date  11/18/17             Plan - 10/07/17 1348    Clinical Impression Statement  Pt is a 77 y/o female who presents to OPPT for vertigo.  Pt reports she's had symptoms essentially since as long as she can remember, but reports exacerbation since Oct 2018.  Clinical findings indicate symptoms are likely multifactorial, including changes in vision and glasses, as well as decreased vestibular input.  Pt will benefit from PT to address deficits listed.    History and Personal Factors relevant to plan of care:  anxiety, headache, HTN    Clinical Presentation  Evolving    Clinical Presentation due to:  fall risk, symptoms x at least 50 years    Clinical Decision Making  Moderate    Rehab Potential  Good    PT Frequency  1x / week    PT Duration  6 weeks    PT Treatment/Interventions  ADLs/Self Care Home Management;Canalith Repostioning;Cryotherapy;Electrical Stimulation;Moist Heat;Gait training;Stair training;Functional mobility training;Therapeutic activities;Therapeutic exercise;Balance training;Patient/family education;Neuromuscular re-education;Vestibular    PT Next Visit Plan   review HEP, consider DVA and/or SOT; work on dynamic balance and vestibular input; pt changing back to contacts so ask if that's made a difference in symptoms    Consulted and Agree with Plan of Care  Patient       Patient will benefit from skilled therapeutic intervention in order to improve the following deficits and impairments:  Abnormal gait, Dizziness, Decreased balance, Impaired vision/preception  Visit Diagnosis: Unsteadiness on feet - Plan: PT plan of care cert/re-cert  Dizziness and giddiness - Plan: PT plan of care cert/re-cert     Problem List Patient Active Problem List   Diagnosis Date Noted  . Elevated troponin 06/15/2017  . C. difficile colitis 06/15/2017  . Essential hypertension   . Nonintractable headache   . Vertigo   . Dizziness 06/10/2017  . Bloating 11/24/2013  . 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 GERD (gastroesophageal reflux disease) 11/03/2013  . Diarrhea 04/28/2012  . ANXIETY 05/06/2009  . IBS 05/06/2009  . CHEST PAIN, ATYPICAL 05/06/2009  . PALPITATIONS 04/30/2009      Laureen Abrahams, PT, DPT 10/07/17 1:55 PM     [facility]  [facility] [address] [place], [place], [address] Phone: [phone]   Fax:  [phone]  Name: Rhonda Andrews MRN: [id number] Date of Birth: 07-18-1941

## 2017-10-07 NOTE — Patient Instructions (Signed)
Gaze Stabilization: Sitting    Keeping eyes on target on wall 3-5 feet away, and move head side to side for _30-60___ seconds. Repeat while moving head up and down for __30-60__ seconds. Do __2-3__ sessions per day.   Gaze Stabilization: Tip Card  1.Target must remain in focus, not blurry, and appear stationary while head is in motion. 2.Perform exercises with small head movements (45 to either side of midline). 3.Increase speed of head motion so long as target is in focus. 4.If you wear eyeglasses, be sure you can see target through lens (therapist will give specific instructions for bifocal / progressive lenses). 5.These exercises may provoke dizziness or nausea. Work through these symptoms. If too dizzy, slow head movement slightly. Rest between each exercise. 6.Exercises demand concentration; avoid distractions.    Feet Together (Compliant Surface) Varied Arm Positions - Eyes Closed    Stand on compliant surface: _pillow or cushion__ with feet together and arms AT YOUR SIDE. Close eyes and try to stand still. Hold__15-20__ seconds. Repeat __3-5__ times per session. Do __2-3__ sessions per day.

## 2017-10-08 DIAGNOSIS — M9901 Segmental and somatic dysfunction of cervical region: Secondary | ICD-10-CM | POA: Diagnosis not present

## 2017-10-08 DIAGNOSIS — M9902 Segmental and somatic dysfunction of thoracic region: Secondary | ICD-10-CM | POA: Diagnosis not present

## 2017-10-08 DIAGNOSIS — M546 Pain in thoracic spine: Secondary | ICD-10-CM | POA: Diagnosis not present

## 2017-10-08 DIAGNOSIS — M9903 Segmental and somatic dysfunction of lumbar region: Secondary | ICD-10-CM | POA: Diagnosis not present

## 2017-10-08 DIAGNOSIS — G441 Vascular headache, not elsewhere classified: Secondary | ICD-10-CM | POA: Diagnosis not present

## 2017-10-08 DIAGNOSIS — S338XXA Sprain of other parts of lumbar spine and pelvis, initial encounter: Secondary | ICD-10-CM | POA: Diagnosis not present

## 2017-10-14 ENCOUNTER — Ambulatory Visit: Payer: Medicare Other

## 2017-10-14 DIAGNOSIS — R2681 Unsteadiness on feet: Secondary | ICD-10-CM

## 2017-10-14 DIAGNOSIS — R42 Dizziness and giddiness: Secondary | ICD-10-CM

## 2017-10-14 NOTE — Therapy (Signed)
[facility] [address] [name], [place], [address] Phone: [phone]   Fax:  [phone]  Physical Therapy Treatment  Patient Details  Name: Rhonda Andrews MRN: [id number] Date of Birth: 05/30/1941 Referring Provider: Dr. Sarina Ill   Encounter Date: 10/14/2017  PT End of Session - 10/14/17 1240    Visit Number  2    Number of Visits  6    Date for PT Re-Evaluation  11/18/17    Authorization Type  Medicare/[employer]    PT Start Time  1145    PT Stop Time  1230    PT Time Calculation (min)  45 min    Activity Tolerance  Patient tolerated treatment well    Behavior During Therapy  [facility] for tasks assessed/performed       Past Medical History:  Diagnosis Date  . Anxiety   . Clostridium difficile diarrhea 02/05/2015  . Dizziness   . 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 Headache   . Hypertension   . IBS (irritable bowel syndrome)   . Lactose intolerance   . Palpitation   . Palpitations     Past Surgical History:  Procedure Laterality Date  . ABDOMINAL HYSTERECTOMY     ovaries remained  . CHOLECYSTECTOMY  3 yrs ago  . Precancerous  Tissue - Nose  February 14-2014    There were no vitals filed for this visit.  Subjective Assessment - 10/14/17 1149    Subjective  pt reports no fall since last PT session.  pt reports she continue to have difficulty with standing HEP exercise. Pt also reports that she is having more s/s with her HEP exercises when she is sitting rather than standing. Pt also reports that is she is in a car she needs to be the driver to avoid dizziness.    Patient Stated Goals  "get rid of drunk feeling"    Currently in Pain?  No/denies             Vestibular Assessment - 10/14/17 1152      Visual Acuity   Static  line 7    Dynamic  Line 2 significant difference from static.( 5 lines).              [facility] PT Treatment/Exercise - 10/14/17 1152      Transfers   Transfers   Sit to Stand;Stand to Sit    Sit to Stand  6: Modified independent (Device/Increase time);With upper extremity assist;Without upper extremity assist;With armrests;From chair/3-in-1    Stand to Sit  6: Modified independent (Device/Increase time);To chair/3-in-1;With upper extremity assist;Without upper extremity assist;With armrests      Posture/Postural Control   Posture/Postural Control  Postural limitations    Postural Limitations  Rounded Shoulders;Forward head;Weight shift right        Vestibular Treatment/Exercise - 10/14/17 1214      Vestibular Treatment/Exercise   Vestibular Treatment Provided  Gaze    Habituation Exercises  --    Gaze Exercises  X1 Viewing Horizontal;X1 Viewing Vertical      Seated Horizontal Head Turns   Number of Reps   --    Symptom Description   --      Seated Vertical Head Turns   Number of Reps   --    Symptom Description   --      Standing Horizontal Head Turns   Number of Reps   --    Symptom Description   --  Standing Vertical Head Turns   Number of Reps   --    Symptom Description   --      X1 Viewing Horizontal   Foot Position  Seating and standing    Time  -- 30 sec.    Reps  2    Comments  Pt reported incr. dizziness during seated activity. Pt required cues for technique.       X1 Viewing Vertical   Foot Position  Seated and standing    Time  -- 30 sec.    Reps  2    Comments  Pt reported incr. dizziness in seated vs. standing. Cues for technique.      Neuro re-ed: Neuro re-ed: sensory organization test performed with following results: Conditions: 1: WNL 2: WNL 3: WNL  4: 1 trial below normal, 2 WNL 5: 1 trial below normal, 2 WNL 6: WNL Composite score: 74 (WNL) Sensory Analysis Som: WNL Vis: WNL Vest: WNL Pref: WNL Strategy analysis: Good use of ankle/hip strategies, except for conditions 5 and 6-both ankle/hip strategies were reduced.       COG alignment: Slight anterior bias.              PT  Education - 10/14/17 1237    Education provided  Yes    Education Details  SOT use and results, impact on her balance, HEP review and relevance to imrpving her funciton, DVA assessment and outcome.     Methods  Explanation;Demonstration;Verbal cues;Tactile cues;Handout    Comprehension  Verbalized understanding;Verbal cues required          PT Long Term Goals - 10/07/17 1352      PT LONG TERM GOAL #1   Title  independent with HEP    Status  New    Target Date  11/18/17      PT LONG TERM GOAL #2   Title  tolerate EC on compliant surface with feet together for > 30 sec for improved balance    Status  New    Target Date  11/18/17      PT LONG TERM GOAL #3   Title  report 50% improvement in symptoms for improved function    Status  New    Target Date  11/18/17      PT LONG TERM GOAL #4   Title  amb > 350' on various indoor/outdoor surfaces performing head turns without gait deviations or symptoms    Status  New    Target Date  11/18/17            Plan - 10/14/17 1248    Clinical Impression Statement  Today Pt completed the Sensory Organization test with a composite score and sensory analysis that were WNL. However, pt did experience incr. postural sway during activities which require incr. vestibular input. Pt's ankle and hip strategies were reduced during conditions which required incr. vestibular input. During Dynamic Visual Acuity assessment pt experienced a significant 5 line difference between static(line 7) and dynamic(line 2) indicating that pt Gaze stability continues to be a source of her balance/dizziness related symptoms. Pt will continue to benefit from skilled PT to address issues noted above.     Rehab Potential  Good    PT Frequency  1x / week    PT Duration  6 weeks    PT Treatment/Interventions  ADLs/Self Care Home Management;Canalith Repostioning;Cryotherapy;Electrical Stimulation;Moist Heat;Gait training;Stair training;Functional mobility  training;Therapeutic activities;Therapeutic exercise;Balance training;Patient/family education;Neuromuscular re-education;Vestibular    PT Next Visit Plan  Pt to continue to work on HEP progression and Gaze stability training as well as other high level balance activates. (head turning, compliant surfaces)    Consulted and Agree with Plan of Care  Patient          Patient will benefit from skilled therapeutic intervention in order to improve the following deficits and impairments:  Abnormal gait, Dizziness, Decreased balance, Impaired vision/preception  Visit Diagnosis: Unsteadiness on feet  Dizziness and giddiness     Problem List Patient Active Problem List   Diagnosis Date Noted  . Elevated troponin 06/15/2017  . C. difficile colitis 06/15/2017  . Essential hypertension   . Nonintractable headache   . Vertigo   . Dizziness 06/10/2017  . Bloating 11/24/2013  . 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 GERD (gastroesophageal reflux disease) 11/03/2013  . Diarrhea 04/28/2012  . ANXIETY 05/06/2009  . IBS 05/06/2009  . CHEST PAIN, ATYPICAL 05/06/2009  . PALPITATIONS 04/30/2009    Waunita Schooner SPT 10/14/2017, 1:11 PM  [facility] [address] [place], [place], [address] Phone: [phone]   Fax:  [phone]  Name: DELANI KOHLI MRN: [id number] Date of Birth: [birth year]

## 2017-10-15 ENCOUNTER — Ambulatory Visit: Payer: Medicare Other | Admitting: Cardiology

## 2017-10-21 ENCOUNTER — Encounter: Payer: Self-pay | Admitting: Physical Therapy

## 2017-10-21 ENCOUNTER — Ambulatory Visit: Payer: Medicare Other | Admitting: Physical Therapy

## 2017-10-21 DIAGNOSIS — R2681 Unsteadiness on feet: Secondary | ICD-10-CM

## 2017-10-21 DIAGNOSIS — R42 Dizziness and giddiness: Secondary | ICD-10-CM

## 2017-10-21 NOTE — Therapy (Signed)
[facility] [address] [place], [place], [address] Phone: [phone]   Fax:  [phone]  Physical Therapy Treatment  Patient Details  Name: Rhonda Andrews MRN: [id number] Date of Birth: 1941-08-02 Referring Provider: Dr. Sarina Ill   Encounter Date: 10/21/2017  PT End of Session - 10/21/17 1511    Visit Number  3    Number of Visits  6    Date for PT Re-Evaluation  11/18/17    Authorization Type  Medicare/[employer]    PT Start Time  1410    PT Stop Time  1451    PT Time Calculation (min)  41 min    Equipment Utilized During Treatment  Gait belt    Activity Tolerance  Patient tolerated treatment well    Behavior During Therapy  [facility] for tasks assessed/performed       Past Medical History:  Diagnosis Date  . Anxiety   . Clostridium difficile diarrhea 02/05/2015  . Dizziness   . 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 Headache   . Hypertension   . IBS (irritable bowel syndrome)   . Lactose intolerance   . Palpitation   . Palpitations     Past Surgical History:  Procedure Laterality Date  . ABDOMINAL HYSTERECTOMY     ovaries remained  . CHOLECYSTECTOMY  3 yrs ago  . Precancerous  Tissue - Nose  February 14-2014    There were no vitals filed for this visit.  Subjective Assessment - 10/21/17 1413    Subjective  had some increased neck pain with vertical exercises so hasn't done those in a couple days.  still trying to get order for new contacts (MD is on vacation so she has to wait).    Patient Stated Goals  "get rid of drunk feeling"    Currently in Pain?  No/denies             Vestibular Assessment - 10/21/17 1415      Vestibular Assessment   General Observation  feels a little lightheaded today               Vestibular Treatment/Exercise - 10/21/17 1425      Vestibular Treatment/Exercise   Vestibular Treatment Provided  Gaze    Gaze Exercises  X1 Viewing Horizontal;X1 Viewing  Vertical      X1 Viewing Horizontal   Foot Position  amb 30'x2    Comments  mild LOB; pt able to self correct      X1 Viewing Vertical   Foot Position  amb 30' x 2         Balance Exercises - 10/21/17 1419      Balance Exercises: Standing   Standing Eyes Closed  Foam/compliant surface;Narrow base of support (BOS);Head turns    Rockerboard  Anterior/posterior;Lateral;Head turns;EC;10 seconds;5 reps    Gait with Head Turns  Forward 120' each: ball toss up/down and horizontal    Tandem Gait  Forward;Retro;Foam/compliant surface ball toss forward    Partial Tandem Stance  Eyes open;Foam/compliant surface head turns    Other Standing Exercises  marching on compliant surface with horizontal/vertical head turns        PT Education - 10/21/17 1511    Education provided  Yes    Education Details  progressed HEP    Person(s) Educated  Patient    Methods  Explanation;Demonstration;Handout    Comprehension  Verbalized understanding;Returned demonstration          PT Long Term Goals -  10/07/17 1352      PT LONG TERM GOAL #1   Title  independent with HEP    Status  New    Target Date  11/18/17      PT LONG TERM GOAL #2   Title  tolerate EC on compliant surface with feet together for > 30 sec for improved balance    Status  New    Target Date  11/18/17      PT LONG TERM GOAL #3   Title  report 50% improvement in symptoms for improved function    Status  New    Target Date  11/18/17      PT LONG TERM GOAL #4   Title  amb > 350' on various indoor/outdoor surfaces performing head turns without gait deviations or symptoms    Status  New    Target Date  11/18/17            Plan - 10/21/17 1511    Clinical Impression Statement  Pt tolerated high level balance activities well today needing minguard to min A with compliant surface activities.  Overall needs min cues to slow down as pt tends to want to rush through exercises resulting in decreased balance.  Pt reported  improved symptoms following exercise today.      PT Treatment/Interventions  ADLs/Self Care Home Management;Canalith Repostioning;Cryotherapy;Electrical Stimulation;Moist Heat;Gait training;Stair training;Functional mobility training;Therapeutic activities;Therapeutic exercise;Balance training;Patient/family education;Neuromuscular re-education;Vestibular    PT Next Visit Plan  gaze stabilization exercises, high level balance activities    Consulted and Agree with Plan of Care  Patient       Patient will benefit from skilled therapeutic intervention in order to improve the following deficits and impairments:  Abnormal gait, Dizziness, Decreased balance, Impaired vision/preception  Visit Diagnosis: Unsteadiness on feet  Dizziness and giddiness     Problem List Patient Active Problem List   Diagnosis Date Noted  . Elevated troponin 06/15/2017  . C. difficile colitis 06/15/2017  . Essential hypertension   . Nonintractable headache   . Vertigo   . Dizziness 06/10/2017  . Bloating 11/24/2013  . 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 GERD (gastroesophageal reflux disease) 11/03/2013  . Diarrhea 04/28/2012  . ANXIETY 05/06/2009  . IBS 05/06/2009  . CHEST PAIN, ATYPICAL 05/06/2009  . PALPITATIONS 04/30/2009     Laureen Abrahams, PT, DPT 10/21/17 3:13 PM    [facility] [address] [place], [place], [address] Phone: [phone]   Fax:  [phone]  Name: Rhonda Andrews MRN: [id number] Date of Birth: April 29, 1941

## 2017-10-21 NOTE — Patient Instructions (Signed)
Feet Together (Compliant Surface) Head Motion - Eyes Closed    Stand on compliant surface: __pillow or cushion____ with feet together. Close eyes and move head slowly, up and down 10 times each way and side to side 10 times each way. Repeat _1-2___ times per session. Do ___1-2_ sessions per day.  Copyright  V[employer]. All rights reserved.

## 2017-10-22 ENCOUNTER — Ambulatory Visit: Payer: Medicare Other | Admitting: Neurology

## 2017-10-22 DIAGNOSIS — S338XXA Sprain of other parts of lumbar spine and pelvis, initial encounter: Secondary | ICD-10-CM | POA: Diagnosis not present

## 2017-10-22 DIAGNOSIS — M9903 Segmental and somatic dysfunction of lumbar region: Secondary | ICD-10-CM | POA: Diagnosis not present

## 2017-10-22 DIAGNOSIS — M9901 Segmental and somatic dysfunction of cervical region: Secondary | ICD-10-CM | POA: Diagnosis not present

## 2017-10-22 DIAGNOSIS — G441 Vascular headache, not elsewhere classified: Secondary | ICD-10-CM | POA: Diagnosis not present

## 2017-10-22 DIAGNOSIS — M546 Pain in thoracic spine: Secondary | ICD-10-CM | POA: Diagnosis not present

## 2017-10-22 DIAGNOSIS — M9902 Segmental and somatic dysfunction of thoracic region: Secondary | ICD-10-CM | POA: Diagnosis not present

## 2017-10-28 ENCOUNTER — Ambulatory Visit: Payer: Medicare Other | Admitting: Physical Therapy

## 2017-11-02 ENCOUNTER — Encounter: Payer: Self-pay | Admitting: Physical Therapy

## 2017-11-02 ENCOUNTER — Ambulatory Visit: Payer: Medicare Other | Attending: Neurology | Admitting: Physical Therapy

## 2017-11-02 DIAGNOSIS — R42 Dizziness and giddiness: Secondary | ICD-10-CM | POA: Insufficient documentation

## 2017-11-02 DIAGNOSIS — R2681 Unsteadiness on feet: Secondary | ICD-10-CM | POA: Diagnosis not present

## 2017-11-02 NOTE — Therapy (Signed)
[facility] [address] [place], [place], [address] Phone: [phone]   Fax:  [phone]  Physical Therapy Treatment  Patient Details  Name: Rhonda Andrews MRN: [id number] Date of Birth: November 13, 1940 Referring Provider: Dr. Sarina Ill   Encounter Date: 11/02/2017  PT End of Session - 11/02/17 1052    Visit Number  4    Number of Visits  6    Date for PT Re-Evaluation  11/18/17    Authorization Type  Medicare/[employer]    PT Start Time  1013    PT Stop Time  1052    PT Time Calculation (min)  39 min    Equipment Utilized During Treatment  Gait belt    Activity Tolerance  Patient tolerated treatment well    Behavior During Therapy  [facility] for tasks assessed/performed       Past Medical History:  Diagnosis Date  . Anxiety   . Clostridium difficile diarrhea 02/05/2015  . Dizziness   . 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 Headache   . Hypertension   . IBS (irritable bowel syndrome)   . Lactose intolerance   . Palpitation   . Palpitations     Past Surgical History:  Procedure Laterality Date  . ABDOMINAL HYSTERECTOMY     ovaries remained  . CHOLECYSTECTOMY  3 yrs ago  . Precancerous  Tissue - Nose  February 14-2014    There were no vitals filed for this visit.  Subjective Assessment - 11/02/17 1016    Subjective  legs are achy and she didn't sleep well.  contacts arrived but wrong Rx, so new ones will arrive tomorrow.  "I stayed dizzy for a good 3 days after I left here."    Patient Stated Goals  "get rid of drunk feeling"    Currently in Pain?  No/denies             Vestibular Assessment - 11/02/17 0001      Visual Acuity   Static  Line 8    Dynamic  Line 2              [facility] Adult PT Treatment/Exercise - 11/02/17 1041      Knee/Hip Exercises: Standing   Step Down  Both;10 reps;Hand Hold: 0;Step Height: 6"    Step Down Limitations  from top step      Vestibular Treatment/Exercise -  11/02/17 1045      Vestibular Treatment/Exercise   Vestibular Treatment Provided  Gaze    Gaze Exercises  X1 Viewing Horizontal;X1 Viewing Vertical      X1 Viewing Horizontal   Foot Position  feet together with full field stimulus    Time  -- 30 sec    Reps  1      X1 Viewing Vertical   Foot Position  feet together with full field stimulus    Time  -- 30 sec    Reps  1         Balance Exercises - 11/02/17 1017      Balance Exercises: Standing   Balance Beam  feet together: EO horizontal/vertical head turns; EC 3x15 sec; SLS to cones; sidestepping; forward stepover; tandem forwards/backwards    Gait with Head Turns  Forward 230' each ball toss vertical and horizontal        PT Education - 11/02/17 1052    Education provided  Yes    Education Details  progressed gaze to full field stimulus    Person(s) Educated  Patient    Methods  Explanation;Demonstration    Comprehension  Verbalized understanding;Returned demonstration          PT Long Term Goals - 10/07/17 1352      PT LONG TERM GOAL #1   Title  independent with HEP    Status  New    Target Date  11/18/17      PT LONG TERM GOAL #2   Title  tolerate EC on compliant surface with feet together for > 30 sec for improved balance    Status  New    Target Date  11/18/17      PT LONG TERM GOAL #3   Title  report 50% improvement in symptoms for improved function    Status  New    Target Date  11/18/17      PT LONG TERM GOAL #4   Title  amb > 350' on various indoor/outdoor surfaces performing head turns without gait deviations or symptoms    Status  New    Target Date  11/18/17            Plan - 11/02/17 1053    Clinical Impression Statement  Pt progressing well, but hasn't been able to try new contact since wrong Rx was sent to pt.  Plan to follow up in 2 weeks and see if pt notices improvement.  Likely d/c next visit.    PT Treatment/Interventions  ADLs/Self Care Home Management;Canalith  Repostioning;Cryotherapy;Electrical Stimulation;Moist Heat;Gait training;Stair training;Functional mobility training;Therapeutic activities;Therapeutic exercise;Balance training;Patient/family education;Neuromuscular re-education;Vestibular    PT Next Visit Plan  plan for d/c next visit if able    Consulted and Agree with Plan of Care  Patient       Patient will benefit from skilled therapeutic intervention in order to improve the following deficits and impairments:  Abnormal gait, Dizziness, Decreased balance, Impaired vision/preception  Visit Diagnosis: Unsteadiness on feet  Dizziness and giddiness     Problem List Patient Active Problem List   Diagnosis Date Noted  . Elevated troponin 06/15/2017  . C. difficile colitis 06/15/2017  . Essential hypertension   . Nonintractable headache   . Vertigo   . Dizziness 06/10/2017  . Bloating 11/24/2013  . 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 GERD (gastroesophageal reflux disease) 11/03/2013  . Diarrhea 04/28/2012  . ANXIETY 05/06/2009  . IBS 05/06/2009  . CHEST PAIN, ATYPICAL 05/06/2009  . PALPITATIONS 04/30/2009      Laureen Abrahams, PT, DPT 11/02/17 10:55 AM    [facility] [address] [place], [place], [address] Phone: [phone]   Fax:  [phone]  Name: Rhonda Andrews MRN: [id number] Date of Birth: September 07, 1940

## 2017-11-05 DIAGNOSIS — S338XXA Sprain of other parts of lumbar spine and pelvis, initial encounter: Secondary | ICD-10-CM | POA: Diagnosis not present

## 2017-11-05 DIAGNOSIS — M546 Pain in thoracic spine: Secondary | ICD-10-CM | POA: Diagnosis not present

## 2017-11-05 DIAGNOSIS — M9903 Segmental and somatic dysfunction of lumbar region: Secondary | ICD-10-CM | POA: Diagnosis not present

## 2017-11-05 DIAGNOSIS — M9902 Segmental and somatic dysfunction of thoracic region: Secondary | ICD-10-CM | POA: Diagnosis not present

## 2017-11-05 DIAGNOSIS — M9901 Segmental and somatic dysfunction of cervical region: Secondary | ICD-10-CM | POA: Diagnosis not present

## 2017-11-05 DIAGNOSIS — G441 Vascular headache, not elsewhere classified: Secondary | ICD-10-CM | POA: Diagnosis not present

## 2017-11-09 ENCOUNTER — Encounter: Payer: Self-pay | Admitting: Family Medicine

## 2017-11-16 ENCOUNTER — Ambulatory Visit: Payer: Medicare Other | Admitting: Physical Therapy

## 2017-11-16 ENCOUNTER — Encounter: Payer: Self-pay | Admitting: Physical Therapy

## 2017-11-16 DIAGNOSIS — R42 Dizziness and giddiness: Secondary | ICD-10-CM | POA: Diagnosis not present

## 2017-11-16 DIAGNOSIS — R2681 Unsteadiness on feet: Secondary | ICD-10-CM

## 2017-11-16 NOTE — Therapy (Signed)
[facility] [address] [place], [place], [address] Phone: [phone]   Fax:  [phone]  Physical Therapy Treatment/Discharge  Patient Details  Name: Rhonda Andrews MRN: [id number] Date of Birth: [birth year] Referring Provider: Dr. Sarina Ill   Encounter Date: 11/16/2017  PT End of Session - 11/16/17 1040    Visit Number  5    Authorization Type  Medicare/[employer]    PT Start Time  1018 d/c visit    PT Stop Time  1037    PT Time Calculation (min)  19 min    Activity Tolerance  Patient tolerated treatment well    Behavior During Therapy  [facility] for tasks assessed/performed       Past Medical History:  Diagnosis Date  . Anxiety   . Clostridium difficile diarrhea 02/05/2015  . Dizziness   . 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 Headache   . Hypertension   . IBS (irritable bowel syndrome)   . Lactose intolerance   . Palpitation   . Palpitations     Past Surgical History:  Procedure Laterality Date  . ABDOMINAL HYSTERECTOMY     ovaries remained  . CHOLECYSTECTOMY  3 yrs ago  . Precancerous  Tissue - Nose  February 14-2014    There were no vitals filed for this visit.  Subjective Assessment - 11/16/17 1018    Subjective  still can't get the contact Rx correct, so hasn't been able to try them at this point.  over past 2 weeks had 2-3 days that "weren't good, but other than that I've been great."    Patient Stated Goals  "get rid of drunk feeling"    Currently in Pain?  -- c/o chronic LBP, not rated and not addressing at this time         [facility] PT Assessment - 11/16/17 1025      Assessment   Medical Diagnosis  vertigo      Observation/Other Assessments   Focus on Therapeutic Outcomes (FOTO)   93 (7% limited)                  [facility] Adult PT Treatment/Exercise - 11/16/17 1039      Ambulation/Gait   Ambulation/Gait  Yes    Ambulation/Gait Assistance  7: Independent    Ambulation Distance  (Feet)  550 Feet    Assistive device  None    Ambulation Surface  Level;Unlevel;Indoor;Outdoor;Paved;Grass mulch      Vestibular Treatment/Exercise - 11/16/17 1038      Vestibular Treatment/Exercise   Vestibular Treatment Provided  Gaze      X1 Viewing Horizontal   Foot Position  feet together on compliant    Time  -- 30    Reps  1    Comments  no increase in symptoms      X1 Viewing Vertical   Foot Position  feet together on compliant    Time  -- 30 sec    Reps  1    Comments  no increase in symptoms         Balance Exercises - 11/16/17 1028      Balance Exercises: Standing   Standing Eyes Closed  Foam/compliant surface;Narrow base of support (BOS);30 secs             PT Long Term Goals - 11/16/17 1040      PT LONG TERM GOAL #1   Title  independent with HEP    Status  Achieved  PT LONG TERM GOAL #2   Title  tolerate EC on compliant surface with feet together for > 30 sec for improved balance    Status  Achieved      PT LONG TERM GOAL #3   Title  report 50% improvement in symptoms for improved function    Status  Achieved      PT LONG TERM GOAL #4   Title  amb > 350' on various indoor/outdoor surfaces performing head turns without gait deviations or symptoms    Status  Achieved            Plan - 11/16/17 1040    Clinical Impression Statement  Pt has met all goals and is ready for d/c.  Overall significantly improved, but still reports occasional days of dizziness, but symptoms less intense and less frequent.    PT Treatment/Interventions  ADLs/Self Care Home Management;Canalith Repostioning;Cryotherapy;Electrical Stimulation;Moist Heat;Gait training;Stair training;Functional mobility training;Therapeutic activities;Therapeutic exercise;Balance training;Patient/family education;Neuromuscular re-education;Vestibular    PT Next Visit Plan  d/c PT    Consulted and Agree with Plan of Care  Patient       Patient will benefit from skilled  therapeutic intervention in order to improve the following deficits and impairments:  Abnormal gait, Dizziness, Decreased balance, Impaired vision/preception  Visit Diagnosis: Unsteadiness on feet  Dizziness and giddiness     Problem List Patient Active Problem List   Diagnosis Date Noted  . Elevated troponin 06/15/2017  . C. difficile colitis 06/15/2017  . Essential hypertension   . Nonintractable headache   . Vertigo   . Dizziness 06/10/2017  . Bloating 11/24/2013  . 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 GERD (gastroesophageal reflux disease) 11/03/2013  . Diarrhea 04/28/2012  . ANXIETY 05/06/2009  . IBS 05/06/2009  . CHEST PAIN, ATYPICAL 05/06/2009  . PALPITATIONS 04/30/2009      Laureen Abrahams, PT, DPT 11/16/17 10:42 AM     [facility] [address] [place], [place], [address] Phone: [phone]   Fax:  [phone]  Name: LESLEY ATKIN MRN: [id number] Date of Birth: 07/11/1941

## 2017-12-19 ENCOUNTER — Other Ambulatory Visit (INDEPENDENT_AMBULATORY_CARE_PROVIDER_SITE_OTHER): Payer: Self-pay | Admitting: Internal Medicine

## 2017-12-19 ENCOUNTER — Other Ambulatory Visit (HOSPITAL_COMMUNITY)
Admission: AD | Admit: 2017-12-19 | Discharge: 2017-12-19 | Disposition: A | Discharge status: Home / Self Care | Payer: Medicare Other | Source: Skilled Nursing Facility | Attending: Internal Medicine | Admitting: Internal Medicine

## 2017-12-19 DIAGNOSIS — R197 Diarrhea, unspecified: Secondary | ICD-10-CM | POA: Insufficient documentation

## 2017-12-19 LAB — C DIFFICILE QUICK SCREEN W PCR REFLEX
C DIFFICILE (CDIFF) INTERP: DETECTED
C Diff antigen: POSITIVE — AB
C Diff toxin: POSITIVE — AB

## 2017-12-19 NOTE — Progress Notes (Signed)
Patient called. She states her husband was released from hospital after therapy for C. difficile colitis. She has a history of C. difficile.  Last therapy was over 6 months ago.  She is having diarrhea.  No fever or abdominal pain or rectal bleeding. She will go to the lab for C. difficile testing for antigen and toxin.

## 2017-12-20 ENCOUNTER — Other Ambulatory Visit (INDEPENDENT_AMBULATORY_CARE_PROVIDER_SITE_OTHER): Payer: Self-pay | Admitting: Internal Medicine

## 2017-12-20 MED ORDER — VANCOMYCIN 50 MG/ML ORAL SOLUTION: 125.0000 mg | Freq: Four times a day (QID) | ORAL | 1 refills | Status: DC

## 2017-12-20 NOTE — Progress Notes (Unsigned)
vancomycin

## 2018-01-11 ENCOUNTER — Ambulatory Visit (INDEPENDENT_AMBULATORY_CARE_PROVIDER_SITE_OTHER): Payer: Medicare Other | Admitting: Cardiology

## 2018-01-11 ENCOUNTER — Encounter: Payer: Self-pay | Admitting: Cardiology

## 2018-01-11 VITALS — BP 122/80 | HR 74 | Ht 64.0 in | Wt 135.0 lb

## 2018-01-11 DIAGNOSIS — R002 Palpitations: Secondary | ICD-10-CM

## 2018-01-11 DIAGNOSIS — I10 Essential (primary) hypertension: Secondary | ICD-10-CM

## 2018-01-11 MED ORDER — PROPRANOLOL HCL ER 60 MG PO CP24: 60.0000 mg | ORAL_CAPSULE | Freq: Every day | ORAL | 3 refills | Status: DC

## 2018-01-11 NOTE — Patient Instructions (Signed)

## 2018-01-11 NOTE — Progress Notes (Signed)
Clinical Summary Rhonda Andrews is a 77 y.o.female seen today for follow up of the following medical problems.   1. Palpitations/HTN - previous holter 05/2009 showed no significant arrhythmias.  - during prior admission did have short episode of PSVT - she was changed from propanolol to atenolol. With change had increase in symptoms and she changed herself back to propanolol - she reports short acting propanolol does not work as well, prefers long acting.  - diltiazem caused low bp's at home per her report, she stopped taking.     - no recent palpitations. - compliant with propanolol.    2.Headache/Dizziness -long history, has appt comint up with neuro. No clear cardiac etiology.  - ungerdoing vestibular disorder treatments per neuro   3. Vertigo - followed by ENT and neuro  - symptoms improved since last visit.    Past Medical History:  Diagnosis Date  . Anxiety   . Clostridium difficile diarrhea 02/05/2015  . Dizziness   . 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 Headache   . Hypertension   . IBS (irritable bowel syndrome)   . Lactose intolerance   . Palpitation   . Palpitations      Allergies  Allergen Reactions  . Prednisone Other (See Comments)    Heart races     Current Outpatient Medications  Medication Sig Dispense Refill  . calcium carbonate 1250 MG capsule Take 1,000 mg by mouth daily.    . cholecalciferol (VITAMIN D) 1000 units tablet Take 1,000 Units by mouth daily. 1/2 tablet each day    . cyanocobalamin 100 MCG tablet Take 100 mcg by mouth daily.    . fluticasone (FLONASE) 50 MCG/ACT nasal spray Place 2 sprays daily into both nostrils. 16 g 6  . lactobacillus acidophilus (BACID) TABS tablet Take 1 tablet by mouth daily.     . meclizine (ANTIVERT) 25 MG tablet Take 1 tablet (25 mg total) by mouth 3 (three) times daily as needed for dizziness. 60 tablet 1  . propranolol ER (INDERAL LA) 60 MG 24 hr capsule Take 1 capsule (60 mg total) by mouth daily. 90  capsule 1  . vancomycin (VANCOCIN) 50 mg/mL oral solution Take 2.5 mLs (125 mg total) by mouth every 6 (six) hours. 140 mL 1   No current facility-administered medications for this visit.      Past Surgical History:  Procedure Laterality Date  . ABDOMINAL HYSTERECTOMY     ovaries remained  . CHOLECYSTECTOMY  3 yrs ago  . Precancerous  Tissue - Nose  February 14-2014     Allergies  Allergen Reactions  . Prednisone Other (See Comments)    Heart races      Family History  Problem Relation Age of Onset  . Lymphoma Mother   . Colon cancer Mother      Social History Rhonda Andrews reports that she has never smoked. She has never used smokeless tobacco. Rhonda Andrews reports that she does not drink alcohol.   Review of Systems CONSTITUTIONAL: No weight loss, fever, chills, weakness or fatigue.  HEENT: Eyes: No visual loss, blurred vision, double vision or yellow sclerae.No hearing loss, sneezing, congestion, runny nose or sore throat.  SKIN: No rash or itching.  CARDIOVASCULAR: per hpi RESPIRATORY: No shortness of breath, cough or sputum.  GASTROINTESTINAL: No anorexia, nausea, vomiting or diarrhea. No abdominal pain or blood.  GENITOURINARY: No burning on urination, no polyuria NEUROLOGICAL: No headache, dizziness, syncope, paralysis, ataxia, numbness or tingling in the extremities. No change in bowel  or bladder control.  MUSCULOSKELETAL: No muscle, back pain, joint pain or stiffness.  LYMPHATICS: No enlarged nodes. No history of splenectomy.  PSYCHIATRIC: No history of depression or anxiety.  ENDOCRINOLOGIC: No reports of sweating, cold or heat intolerance. No polyuria or polydipsia.  Marland Kitchen   Physical Examination Vitals:   01/11/18 1256  BP: 122/80  Pulse: 74  SpO2: 96%   Vitals:   01/11/18 1256  Weight: 135 lb (61.2 kg)  Height: 5\' 4"  (1.626 m)    Gen: resting comfortably, no acute distress HEENT: no scleral icterus, pupils equal round and reactive, no  palptable cervical adenopathy,  CV: RRR, no m/r/g, no jvd Resp: Clear to auscultation bilaterally GI: abdomen is soft, non-tender, non-distended, normal bowel sounds, no hepatosplenomegaly MSK: extremities are warm, no edema.  Skin: warm, no rash Neuro:  no focal deficits Psych: appropriate affect   Diagnostic Studies     Assessment and Plan  1. Palpitations - no recent symptoms - medical therapy complicated by side effects. Did not tolerate dilt or atenolol, only long acting propanol has seemed to help her symptoms, though previously this caused some low bp's which seem to have resolved - continue current meds. If recurrent palpitations and issues with medical therapy would consider repeating event monitor, if having runs of SVT could be a candidate for ablation  2. HTN - at goal, continue current meds       Arnoldo Lenis, M.D.,

## 2018-01-23 ENCOUNTER — Other Ambulatory Visit (HOSPITAL_COMMUNITY)
Admission: RE | Admit: 2018-01-23 | Discharge: 2018-01-23 | Disposition: A | Discharge status: Home / Self Care | Payer: Medicare Other | Source: Ambulatory Visit | Attending: Internal Medicine | Admitting: Internal Medicine

## 2018-01-23 ENCOUNTER — Other Ambulatory Visit (INDEPENDENT_AMBULATORY_CARE_PROVIDER_SITE_OTHER): Payer: Self-pay | Admitting: Internal Medicine

## 2018-01-23 DIAGNOSIS — R197 Diarrhea, unspecified: Secondary | ICD-10-CM | POA: Diagnosis not present

## 2018-01-23 LAB — CLOSTRIDIUM DIFFICILE BY PCR, REFLEXED: Toxigenic C. Difficile by PCR: POSITIVE — AB

## 2018-01-23 LAB — C DIFFICILE QUICK SCREEN W PCR REFLEX
C DIFFICILE (CDIFF) TOXIN: NEGATIVE
C Diff antigen: POSITIVE — AB

## 2018-01-23 MED ORDER — VANCOMYCIN 50 MG/ML ORAL SOLUTION: 125.0000 mg | Freq: Four times a day (QID) | ORAL | 1 refills | Status: DC

## 2018-01-23 NOTE — Progress Notes (Signed)
Patient  patient finished vancomycin 1 week ago.  Diarrhea is back.  She is not febrile or vomiting. Her husband also had C. difficile colitis and doing fine with vancomycin which she finished few days ago. Patient will come to the lab for stool study.  Prescription for vancomycin sent to her pharmacy. We will refer patient to Dr. Carlean Purl for [place].

## 2018-02-02 DIAGNOSIS — M546 Pain in thoracic spine: Secondary | ICD-10-CM | POA: Diagnosis not present

## 2018-02-02 DIAGNOSIS — M9901 Segmental and somatic dysfunction of cervical region: Secondary | ICD-10-CM | POA: Diagnosis not present

## 2018-02-02 DIAGNOSIS — M9902 Segmental and somatic dysfunction of thoracic region: Secondary | ICD-10-CM | POA: Diagnosis not present

## 2018-02-02 DIAGNOSIS — G441 Vascular headache, not elsewhere classified: Secondary | ICD-10-CM | POA: Diagnosis not present

## 2018-02-02 DIAGNOSIS — S338XXA Sprain of other parts of lumbar spine and pelvis, initial encounter: Secondary | ICD-10-CM | POA: Diagnosis not present

## 2018-02-02 DIAGNOSIS — M9903 Segmental and somatic dysfunction of lumbar region: Secondary | ICD-10-CM | POA: Diagnosis not present

## 2018-02-04 ENCOUNTER — Encounter: Payer: Self-pay | Admitting: Family Medicine

## 2018-02-05 ENCOUNTER — Encounter: Payer: Self-pay | Admitting: Family Medicine

## 2018-02-15 ENCOUNTER — Telehealth (INDEPENDENT_AMBULATORY_CARE_PROVIDER_SITE_OTHER): Payer: Self-pay | Admitting: Internal Medicine

## 2018-02-15 NOTE — Telephone Encounter (Signed)
She is following up on appointment with specialist for fecal implant , she states that it has been since May 24 th and she had not heard. She ask if she is to continue taking the medication? She also states that her stomach still does not feel right. Patient was advised that this would be addressed with Dr.Rehman, then we would call her back.

## 2018-02-15 NOTE — Telephone Encounter (Signed)
Patient stopped by office would like a call back

## 2018-02-17 ENCOUNTER — Other Ambulatory Visit (INDEPENDENT_AMBULATORY_CARE_PROVIDER_SITE_OTHER): Payer: Self-pay | Admitting: *Deleted

## 2018-02-17 DIAGNOSIS — Z1211 Encounter for screening for malignant neoplasm of colon: Secondary | ICD-10-CM

## 2018-02-18 NOTE — Telephone Encounter (Signed)
I ask Dr.Rehman. He ask that Rhonda Andrews followup on this, reference to the patient having a fecal implant. She stated that she had been waiting since May 24 th.

## 2018-02-19 ENCOUNTER — Telehealth: Payer: Self-pay | Admitting: Internal Medicine

## 2018-02-19 DIAGNOSIS — A09 Infectious gastroenteritis and colitis, unspecified: Secondary | ICD-10-CM

## 2018-02-19 NOTE — Telephone Encounter (Signed)
Ok to schedule with you?

## 2018-02-19 NOTE — Telephone Encounter (Signed)
Referral placed in Epic, [facility] will review and contact patient with appt, patient aware

## 2018-02-22 NOTE — Telephone Encounter (Signed)
I can see her - will need to work in next week I think and also get an appointment Dr. Baxter Flattery - ID  She could take an APP appt as long as I am in office on 3rd floor and I can coordinate it that way also  Get a sx update from patient and double check that she is on vancomycin and let me know

## 2018-02-22 NOTE — Telephone Encounter (Signed)
Patient notified and is scheduled for OV 03/01/18 3:30. Referral placed for ID.  Patient aware that she will get a phone call from ID as well.

## 2018-03-01 ENCOUNTER — Ambulatory Visit (INDEPENDENT_AMBULATORY_CARE_PROVIDER_SITE_OTHER): Payer: Medicare Other | Admitting: Internal Medicine

## 2018-03-01 ENCOUNTER — Encounter: Payer: Self-pay | Admitting: Internal Medicine

## 2018-03-01 VITALS — BP 142/80 | HR 72 | Ht 64.0 in | Wt 136.6 lb

## 2018-03-01 DIAGNOSIS — K6389 Other specified diseases of intestine: Secondary | ICD-10-CM

## 2018-03-01 DIAGNOSIS — K529 Noninfective gastroenteritis and colitis, unspecified: Secondary | ICD-10-CM

## 2018-03-01 DIAGNOSIS — A0471 Enterocolitis due to Clostridium difficile, recurrent: Secondary | ICD-10-CM

## 2018-03-01 NOTE — Progress Notes (Signed)
Referred by Hildred Laser, MD  ASSESSMENT AND PLAN    Encounter Diagnoses  Name Primary?  . Recurrent Clostridium difficile diarrhea Yes  . Chronic diarrhea of unknown origin - ? IBS, ? post cholecystectomy, ? SIBO   . Small intestinal bacterial overgrowth    This is a somewhat complicated history but I think she has had C. difficile at least 3 times may before.  It has been recurrent.  Her husband has just had it in the last couple of months and perhaps they are passing it back and forth.  She does not relate a history of significant incontinence episodes and contamination though there is some carpet upstairs in the house I have recommended she consider steam cleaning.  I think she is a reasonable candidate to have a fecal transplant but wonder if her husband ought to have one as well.   Would definitely pursue open biome stool donation.  I have reviewed with her the risks of fecal transplant including obtaining diseases related to changes in the fecal biome that we cannot predict and that there is a risk of transmission of infection as is recently happened though using open biome with the fastidious screening they do I think those risks are very unlikely.  Some of her diarrhea she is had over the years is clearly not C. difficile.  She could be someone who is at risk for and have IBD perhaps.  She also could have underlying microscopic colitis.  Given that she said Salmonella and I think Campylobacter the possibility of an immune deficiency comes to mind though there are not other recurrent infections so seems unlikely.  She has an appointment with Dr. Baxter Flattery of infectious disease next week.  I have given the patient instructions on how to prep for colonoscopy and reviewed the risks benefits and indications of colonoscopy as well.  Will await her appointment with Dr. Baxter Flattery and continue vancomycin at 125 mg daily at this point.  Would anticipate that stopping that  before her  colonoscopy as her typical protocol.  We will see what Dr. Baxter Flattery thinks about her husband's problems with C. difficile, we may want to test both of them to see if they still have antigen but not toxin.  I think it would be reasonable to get a set of immunoglobulins on her and will ask Dr. Baxter Flattery to do that.  I appreciate the opportunity to care for this patient. I will send a copy to Dr. Hildred Laser CC: Caren Macadam, MD    Subjective:   Chief Complaint: Recurrent C. difficile colitis consider fecal transplantation  HPI The patient is a 77 year old married woman who has had recurrent C. difficile colitis lately, she has a history of chronic diarrhea and saw Dr. Jearld Adjutant in November 2013, after she had taken Xifaxan for a positive breath test suggesting small intestinal bacterial overgrowth, work-up performed to [facility].  At that time in November 2013 she was retreated she did well for months after that if not years.  Using metronidazole.  She had a normal small bowel follow-through.  In June 2016 she was found to have C. difficile positive stools and was treated with 10 days of vancomycin. Had some persistent diarrhea but a C. difficile PCR check was negative in July 2016. The diarrhea was not a severe C. difficile apparently.  She continued to have diarrhea problems and was treated with probiotic and Imodium.  In June 2018 she was being treated for Campylobacter in stool.  Was on Flagyl which might have been empiric, switch to 3 days of azithromycin.  She did not take that and felt like she spontaneously cleared.  In 2018 she was treated with vancomycin which I think was empiric but had negative C. Difficile. In October 2018 she was admitted to the hospital with diarrhea and had a positive Salmonella and C. difficile.    More diarrhea and Had positive C. difficile antigen and toxin 2 months ago April 2019.  Husband had C. difficile at this time and was hospitalized in April and had  persistent sxs and + C diff Ag/tox and was retreated vancomycin 125 mg qid x 14 d and I think is better  She was treated and improved and then relapsed and was treated again with vancomycinPositive C. difficile antigen negative toxin 01/23/2018 and referred for consideration of fecal transplant at this time. Now on vancomycin 125 mg bid  And w/o sig diarrhea  Note that she does think after a cholecystectomy which was probably around her before 2010 she has had some diarrhea problems as well.  I think this was part of the reason she was referred to [facility] by Dr. Stephani Police years ago.  Last colonoscopy 2010 left-sided diverticulosis.  Imaging review CT the abdomen and pelvis 2011 having diarrhea than possible mild sigmoid colon wall thickening without surrounding inflammation  Normal small bowel series as mentioned above in 2013 Allergies  Allergen Reactions  . Prednisone Other (See Comments)    Heart races   Current Meds  Medication Sig  . calcium carbonate 1250 MG capsule Take 1,000 mg by mouth daily.  . cholecalciferol (VITAMIN D) 1000 units tablet Take 1,000 Units by mouth daily. 1/2 tablet each day  . cyanocobalamin 100 MCG tablet Take 100 mcg by mouth daily.  . fluticasone (FLONASE) 50 MCG/ACT nasal spray Place 2 sprays daily into both nostrils.  . lactobacillus acidophilus (BACID) TABS tablet Take 1 tablet by mouth daily.   . meclizine (ANTIVERT) 25 MG tablet Take 1 tablet (25 mg total) by mouth 3 (three) times daily as needed for dizziness.  . propranolol ER (INDERAL LA) 60 MG 24 hr capsule Take 1 capsule (60 mg total) by mouth daily.  . [DISCONTINUED] vancomycin (VANCOCIN) 50 mg/mL oral solution Take 2.5 mLs (125 mg total) by mouth every 6 (six) hours. (Patient taking differently: Take 125 mg by mouth every 12 (twelve) hours. )   Past Medical History:  Diagnosis Date  . Anxiety   . 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 Clostridium difficile diarrhea 02/05/2015  .  Dizziness   . 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 Headache   . Hypertension   . IBS (irritable bowel syndrome)   . Lactose intolerance   . Palpitation   . Palpitations    Past Surgical History:  Procedure Laterality Date  . ABDOMINAL HYSTERECTOMY     ovaries remained  . CHOLECYSTECTOMY    . COLONOSCOPY  2010  . Precancerous  Tissue - Nose  February 14-2014   Social History   Social History Narrative   Married. Has a daughter. 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   Eats all food groups and has a great appetite.    Walks in house daily in her house, 1.5 miles a day.   Right handed   Quit drinking caffeine 15 years ago   No EtOH/tobacco   family history includes Colon cancer in her mother; Lymphoma in  her mother.   Review of Systems Allergies, fatigue, chronic dizziness, arthritis All other ROS negative  Objective:   Physical Exam @BP  (!) 142/80   Pulse 72   Ht 5\' 4"  (1.626 m)   Wt 136 lb 9.6 oz (62 kg)   BMI 23.45 kg/m @  General:  Well-developed, well-nourished and in no acute distress Eyes:  anicteric. ENT:   Mouth and posterior pharynx free of lesions.  Neck:   supple w/o thyromegaly or mass.  Lungs: Clear to auscultation bilaterally. Heart:  S1S2, no rubs, murmurs, gallops. Abdomen:  soft, non-tender, no hepatosplenomegaly, hernia, or mass and BS+.  Rectal: deferred Lymph:  no cervical or supraclavicular adenopathy. Extremities:   no edema, cyanosis or clubbing Skin   no rash. Neuro:  A&O x 3.  Psych:  appropriate mood and  Affect.   Data Reviewed: See HPI

## 2018-03-01 NOTE — Assessment & Plan Note (Signed)
FMT  Risks, etc reviewed  Husband???  vanco 125 qd for now  [employer]

## 2018-03-01 NOTE — Patient Instructions (Addendum)
  We are giving you a set of blank instructions to fill in the blanks once your procedure has been set up for your fecal transplant.  Call and let me know what your procedure date is.   Dr Carlean Purl has adjusted your Vancomycin instructions. Take one capsule daily.    I appreciate the opportunity to care for you. Silvano Rusk, MD, [facility]

## 2018-03-04 ENCOUNTER — Encounter: Payer: Self-pay | Admitting: Internal Medicine

## 2018-03-04 DIAGNOSIS — A0471 Enterocolitis due to Clostridium difficile, recurrent: Secondary | ICD-10-CM | POA: Insufficient documentation

## 2018-03-11 ENCOUNTER — Ambulatory Visit (INDEPENDENT_AMBULATORY_CARE_PROVIDER_SITE_OTHER): Payer: Medicare Other | Admitting: Internal Medicine

## 2018-03-11 ENCOUNTER — Other Ambulatory Visit: Payer: Self-pay

## 2018-03-11 ENCOUNTER — Encounter: Payer: Self-pay | Admitting: Internal Medicine

## 2018-03-11 VITALS — BP 154/83 | HR 80 | Wt 137.0 lb

## 2018-03-11 DIAGNOSIS — A0471 Enterocolitis due to Clostridium difficile, recurrent: Secondary | ICD-10-CM | POA: Diagnosis not present

## 2018-03-11 NOTE — Progress Notes (Signed)
RFV: referral for evaluation of cdifficile. Patient ID: Rhonda Andrews, female   DOB: Feb 09, 1941, 77 y.o.   MRN: [id number]  HPI Rhonda Andrews is a 77yo F who has hx of IBS, palpitations, who has had recurrent cdifficile since spring 2018. She states it was diagnosed after Also hx of campylobacter from [facility] chicken, hospitalized, then  Relapse in October. She states that she relapsed after her husband has had a few bouts of cdifficile. She notes that when she is on oral vancomycin at 4x per day-- she suffers from hypotension and has had to take salt supplementation. Currently she is on once a day vanco, BP ok. She reports having Twice a day, bm -soft. No abdominal cramping with BM. No weight loss. Up 5 lb since October but first event, she reports having 15-18# weight loss  Her husband has 2 relapse, but none 3 months.   She was seen by dr Carlean Purl for evaluation of fecal transplant  She has several questions in terms of fecal transplant. how it is done and reisks associated with fecal transplant. She also states that her out of pocket costs are significant.  Outpatient Encounter Medications as of 03/11/2018  Medication Sig  . calcium carbonate 1250 MG capsule Take 1,000 mg by mouth daily.  . cholecalciferol (VITAMIN D) 1000 units tablet Take 1,000 Units by mouth daily. 1/2 tablet each day  . cyanocobalamin 100 MCG tablet Take 100 mcg by mouth daily.  Marland Kitchen lactobacillus acidophilus (BACID) TABS tablet Take 1 tablet by mouth daily.   . propranolol ER (INDERAL LA) 60 MG 24 hr capsule Take 1 capsule (60 mg total) by mouth daily.  . vancomycin (VANCOCIN) 125 MG capsule Take 1 capsule (125 mg total) by mouth daily.  . fluticasone (FLONASE) 50 MCG/ACT nasal spray Place 2 sprays daily into both nostrils. (Patient not taking: Reported on 03/11/2018)  . meclizine (ANTIVERT) 25 MG tablet Take 1 tablet (25 mg total) by mouth 3 (three) times daily as needed for dizziness. (Patient not taking: Reported on  03/11/2018)   No facility-administered encounter medications on file as of 03/11/2018.      Patient Active Problem List   Diagnosis Date Noted  . 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 Elevated troponin 06/15/2017  . 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 Essential hypertension   . Nonintractable headache   . Vertigo   . Dizziness 06/10/2017  . Bloating 11/24/2013  . 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 GERD (gastroesophageal reflux disease) 11/03/2013  . Diarrhea 04/28/2012  . ANXIETY 05/06/2009  . IBS 05/06/2009  . CHEST PAIN, ATYPICAL 05/06/2009  . PALPITATIONS 04/30/2009     Health Maintenance Due  Topic Date Due  . DEXA SCAN  03/31/2008  . TETANUS/TDAP  10/11/2015  . MAMMOGRAM  12/21/2015    Social History   Tobacco Use  . Smoking status: Never Smoker  . Smokeless tobacco: Never Used  Substance Use Topics  . Alcohol use: No    Alcohol/week: 0.0 oz  . Drug use: No   family history includes Colon cancer in her mother; Lymphoma in her mother. Review of Systems  Constitutional: Negative for fever, chills, diaphoresis, activity change, appetite change, fatigue and unexpected weight change.  HENT: Negative for congestion, sore throat, rhinorrhea, sneezing, trouble swallowing and sinus pressure.  Eyes: Negative for photophobia and visual disturbance.  Respiratory: Negative for cough, chest tightness, shortness of breath, wheezing and stridor.  Cardiovascular: Negative for chest pain, palpitations and leg swelling.  Gastrointestinal: +  hx of diarrhea. Negative for nausea, vomiting, abdominal pain,constipation, blood in stool, abdominal distention and anal bleeding.  Genitourinary: Negative for dysuria, hematuria, flank pain and difficulty urinating.  Musculoskeletal: Negative for myalgias, back pain, joint swelling, arthralgias and gait problem.  Skin: Negative for color change, pallor, rash and wound.  Neurological: Negative for  dizziness, tremors, weakness and light-headedness.  Hematological: Negative for adenopathy. Does not bruise/bleed easily.  Psychiatric/Behavioral: Negative for behavioral problems, confusion, sleep disturbance, dysphoric mood, decreased concentration and agitation.    Physical Exam   BP (!) 154/83   Pulse 80   Wt 137 lb (62.1 kg)   BMI 23.52 kg/m   Physical Exam  Constitutional: He is oriented to person, place, and time. He appears well-developed and well-nourished. No distress.  HENT:  Mouth/Throat: Oropharynx is clear and moist. No oropharyngeal exudate.  Cardiovascular: Normal rate, regular rhythm and normal heart sounds. Exam reveals no gallop and no friction rub.  No murmur heard.  Pulmonary/Chest: Effort normal and breath sounds normal. No respiratory distress. He has no wheezes.  Abdominal: Soft. Bowel sounds are normal. He exhibits no distension. There is no tenderness.   Neurological: He is alert and oriented to person, place, and time.  Skin: Skin is warm and dry. No rash noted. No erythema.  Psychiatric: He has a normal mood and affect. His behavior is normal.    CBC Lab Results  Component Value Date   WBC 6.9 06/15/2017   RBC 5.01 06/15/2017   HGB 15.0 06/15/2017   HCT 45.4 06/15/2017   PLT 209 06/15/2017   MCV 90.6 06/15/2017   MCH 29.9 06/15/2017   MCHC 33.0 06/15/2017   RDW 13.1 06/15/2017   LYMPHSABS 2.3 06/15/2017   MONOABS 0.5 06/15/2017   EOSABS 0.1 06/15/2017    BMET Lab Results  Component Value Date   NA 140 08/17/2017   K 4.2 08/17/2017   CL 102 08/17/2017   CO2 31 08/17/2017   GLUCOSE 111 08/17/2017   BUN 22 08/17/2017   CREATININE 0.94 (H) 08/17/2017   CALCIUM 9.2 08/17/2017   GFRNONAA 60 08/17/2017   GFRAA 69 08/17/2017    Assessment and Plan Recurrent cdifficile infection = since she has had several relapses. She would likley benefit from getting FMT. We have discussed the risk and benefits of FMT. due to to her high out of pocket  costs with medicaiotn, we will try to see if can get her medications cheaper. Will have her take bid dosing prior to the [facility] for 12 days prior to the [place]. Will need to coordinate with dr Carlean Purl scheduling the fmt. Likely in 3 wk. Submitting IRB to comply with FDA recommendations.  Spent 45 min with patient with greater than 50% coordination of care

## 2018-03-17 DIAGNOSIS — R5383 Other fatigue: Secondary | ICD-10-CM | POA: Diagnosis not present

## 2018-03-17 DIAGNOSIS — A0472 Enterocolitis due to Clostridium difficile, not specified as recurrent: Secondary | ICD-10-CM | POA: Diagnosis not present

## 2018-03-17 DIAGNOSIS — K589 Irritable bowel syndrome without diarrhea: Secondary | ICD-10-CM | POA: Diagnosis not present

## 2018-03-19 ENCOUNTER — Telehealth: Payer: Self-pay | Admitting: *Deleted

## 2018-03-19 NOTE — Telephone Encounter (Signed)
Spoke with Jana Half. She has enough oral vancomycin to take daily until Monday 7/22. Please advise on refills. Landis Gandy, RN

## 2018-03-19 NOTE — Telephone Encounter (Signed)
-----   Message from Carlyle Basques, MD sent at 03/12/2018 10:55 PM EDT ----- Could you call mrs Mcclurkin for me to let her know that we will contact her when we figure out when dr Carlean Purl can do the FMT. Once that date is established then we can figure out how many days of oral vancomycin we need to give her. At the moment, I don't want to refill it yet. Just have her stay on once a day dosing. Thank you

## 2018-03-22 ENCOUNTER — Telehealth: Payer: Self-pay | Admitting: Pharmacist

## 2018-03-22 ENCOUNTER — Other Ambulatory Visit: Payer: Self-pay | Admitting: Pharmacist

## 2018-03-22 DIAGNOSIS — A0472 Enterocolitis due to Clostridium difficile, not specified as recurrent: Secondary | ICD-10-CM

## 2018-03-22 MED ORDER — VANCOMYCIN HCL 50 MG/ML PO SOLR: ORAL | 1 refills | Status: DC

## 2018-03-22 MED ORDER — VANCOMYCIN HCL 50 MG/ML PO SOLR: 125.0000 mg | Freq: Two times a day (BID) | ORAL | 1 refills | Status: DC

## 2018-03-22 MED FILL — FIRVANQ 50 MG/ML SOLN: 50 | 14 days supply | Qty: 150 | Fill #0

## 2018-03-22 NOTE — Telephone Encounter (Addendum)
Spoke to patient to try and coordinate oral vancomycin for her recurrent c diff. [facility] was able to fill Firvanq (new solution formulation) for ~$100. Rhonda Andrews is also working on getting her co-pay assistance through [employer].  She has enough medication to last her until Wednesday. She will pick it up then.

## 2018-03-31 DIAGNOSIS — L570 Actinic keratosis: Secondary | ICD-10-CM | POA: Diagnosis not present

## 2018-03-31 DIAGNOSIS — Z85828 Personal history of other malignant neoplasm of skin: Secondary | ICD-10-CM | POA: Diagnosis not present

## 2018-03-31 DIAGNOSIS — D2330 Other benign neoplasm of skin of unspecified part of face: Secondary | ICD-10-CM | POA: Diagnosis not present

## 2018-04-08 MED FILL — FIRVANQ 50 MG/ML SOLN: 50 | 14 days supply | Qty: 150 | Fill #1

## 2018-04-21 ENCOUNTER — Encounter: Payer: Self-pay | Admitting: Internal Medicine

## 2018-04-21 ENCOUNTER — Ambulatory Visit (INDEPENDENT_AMBULATORY_CARE_PROVIDER_SITE_OTHER): Payer: Medicare Other | Admitting: Internal Medicine

## 2018-04-21 VITALS — BP 124/80 | HR 74 | Wt 136.8 lb

## 2018-04-21 DIAGNOSIS — A0472 Enterocolitis due to Clostridium difficile, not specified as recurrent: Secondary | ICD-10-CM | POA: Diagnosis not present

## 2018-04-21 DIAGNOSIS — R110 Nausea: Secondary | ICD-10-CM | POA: Diagnosis not present

## 2018-04-21 MED ORDER — ONDANSETRON HCL 4 MG PO TABS: 4.0000 mg | ORAL_TABLET | Freq: Three times a day (TID) | ORAL | 0 refills | Status: DC | PRN

## 2018-04-21 MED ORDER — VANCOMYCIN HCL 50 MG/ML PO SOLR: 125.0000 mg | Freq: Two times a day (BID) | ORAL | 1 refills | Status: DC

## 2018-04-21 MED FILL — ONDANSETRON HCL 4 MG TABLET: 4 | 7 days supply | Qty: 20 | Fill #0

## 2018-04-21 MED FILL — FIRVANQ 50 MG/ML SOLN: 50 | 14 days supply | Qty: 150 | Fill #0

## 2018-04-21 NOTE — Progress Notes (Signed)
RFV: follow up for recurrent cdiff  Patient ID: Rhonda Andrews, female   DOB: April 19, 1941, 77 y.o.   MRN: [id number]  HPI Rhonda Andrews is 77yo F with HTN, GERD, and history of recurrent cdifficile. She has been maintained on oral vanco 125mg  BID. She maintainas that she is still feeling poorly, mostly nausea with taking oral vancomycin. She reports Has 1-2 loose stools per day . She denies abdominal cramping. No blood in stool. Some loss of appetite.  Husband with symptoms too Outpatient Encounter Medications as of 04/21/2018  Medication Sig  . calcium carbonate 1250 MG capsule Take 1,000 mg by mouth daily.  . cholecalciferol (VITAMIN D) 1000 units tablet Take 1,000 Units by mouth daily. 1/2 tablet each day  . cyanocobalamin 100 MCG tablet Take 100 mcg by mouth daily.  . fluticasone (FLONASE) 50 MCG/ACT nasal spray Place 2 sprays daily into both nostrils.  . lactobacillus acidophilus (BACID) TABS tablet Take 1 tablet by mouth daily.   . meclizine (ANTIVERT) 25 MG tablet Take 1 tablet (25 mg total) by mouth 3 (three) times daily as needed for dizziness.  . propranolol ER (INDERAL LA) 60 MG 24 hr capsule Take 1 capsule (60 mg total) by mouth daily.  . Vancomycin HCl (FIRVANQ) 50 MG/ML SOLR Take 2.5 mLs (125 mg total) by mouth 2 (two) times daily.   No facility-administered encounter medications on file as of 04/21/2018.      Patient Active Problem List   Diagnosis Date Noted  . 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 Elevated troponin 06/15/2017  . 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 Essential hypertension   . Nonintractable headache   . Vertigo   . Dizziness 06/10/2017  . Bloating 11/24/2013  . 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 GERD (gastroesophageal reflux disease) 11/03/2013  . Diarrhea 04/28/2012  . ANXIETY 05/06/2009  . IBS 05/06/2009  . CHEST PAIN, ATYPICAL 05/06/2009  . PALPITATIONS 04/30/2009     Health Maintenance Due  Topic Date  Due  . DEXA SCAN  03/31/2008  . TETANUS/TDAP  10/11/2015  . INFLUENZA VACCINE  04/01/2018     Review of Systems Per hpi otherwise 12 point ros is negative- has long standing hearing loss Physical Exam   BP 124/80   Pulse 74   Wt 136 lb 12.8 oz (62.1 kg)   BMI 23.48 kg/m   gen = a xo by 4 in nad heent = MMM, [place]/AT, PERRLA, EOMI abd = soft, BS+ Ext+ no c/c/e   CBC Lab Results  Component Value Date   WBC 6.9 06/15/2017   RBC 5.01 06/15/2017   HGB 15.0 06/15/2017   HCT 45.4 06/15/2017   PLT 209 06/15/2017   MCV 90.6 06/15/2017   MCH 29.9 06/15/2017   MCHC 33.0 06/15/2017   RDW 13.1 06/15/2017   LYMPHSABS 2.3 06/15/2017   MONOABS 0.5 06/15/2017   EOSABS 0.1 06/15/2017    BMET Lab Results  Component Value Date   NA 140 08/17/2017   K 4.2 08/17/2017   CL 102 08/17/2017   CO2 31 08/17/2017   GLUCOSE 111 08/17/2017   BUN 22 08/17/2017   CREATININE 0.94 (H) 08/17/2017   CALCIUM 9.2 08/17/2017   GFRNONAA 60 08/17/2017   GFRAA 69 08/17/2017      Assessment and Plan  Recurrent cdifficile Will refill oral vanco with plan to do FMT in 2-3wk  Will get zofran  Discus with dr Carlean Purl? Any need to assess husband.  Spent 30 min with patient  with greater than 50% discussing process of FMT

## 2018-04-22 ENCOUNTER — Telehealth: Payer: Self-pay | Admitting: *Deleted

## 2018-04-22 NOTE — Telephone Encounter (Signed)
Per Dr Baxter Flattery called [facility] to have them draw a Sed Rate and CRP after that they can D/C the PICC today 04/22/18 if possible. The patient is aware of labs and PICC plan.

## 2018-04-27 ENCOUNTER — Telehealth: Payer: Self-pay

## 2018-04-27 DIAGNOSIS — A0471 Enterocolitis due to Clostridium difficile, recurrent: Secondary | ICD-10-CM

## 2018-04-27 NOTE — Telephone Encounter (Signed)
I left a detailed message on her voice mail we will be back in touch this week .

## 2018-04-27 NOTE — Telephone Encounter (Signed)
Rhonda Andrews called to touch base to see when her FMT will be done. She saw Dr Baxter Flattery 04/21/18 and the after visit summary says hopefully it will be done in the next 2-3 weeks. Please advise Sir, thank you.

## 2018-04-27 NOTE — Telephone Encounter (Signed)
Sorry - Dr. Baxter Flattery and I are trying to figure that out and will get back this week

## 2018-04-29 NOTE — Telephone Encounter (Signed)
I spoke to her and told herwe are aiming for week of 9/16

## 2018-04-30 NOTE — Telephone Encounter (Signed)
Once we get the date/time I'll instruct Jazel.

## 2018-05-10 ENCOUNTER — Telehealth: Payer: Self-pay | Admitting: *Deleted

## 2018-05-10 DIAGNOSIS — I10 Essential (primary) hypertension: Secondary | ICD-10-CM | POA: Diagnosis not present

## 2018-05-10 DIAGNOSIS — A0471 Enterocolitis due to Clostridium difficile, recurrent: Secondary | ICD-10-CM | POA: Diagnosis not present

## 2018-05-10 MED FILL — FIRVANQ 50 MG/ML SOLN: 50 | 14 days supply | Qty: 150 | Fill #1

## 2018-05-10 NOTE — Telephone Encounter (Signed)
Patient left message in triage stating her oral vancomycin expired 9/7. She would like to know if she is supposed to continue this until her stool transplant. Please advise. Landis Gandy, RN

## 2018-05-11 NOTE — Telephone Encounter (Signed)
Per Dr Carlean Purl we are in a holding pattern for the FMT to be approved.

## 2018-05-12 ENCOUNTER — Ambulatory Visit: Payer: Medicare Other | Admitting: Internal Medicine

## 2018-05-12 NOTE — Telephone Encounter (Signed)
Vancomycin 125 mg one twice daily for 10 days called to pharmacy per Dr Baxter Flattery.     Laverle Patter, RN

## 2018-05-12 NOTE — Telephone Encounter (Signed)
Can you refill at 125mg  twice a day x 10 d. We are trying to arrange to get her FMT at end of next week

## 2018-05-18 NOTE — Telephone Encounter (Signed)
Can we get her set up for a colonoscopy and FMT on Friday? [place] MAC or moderate ok  She needs to stop vancomycin (do not take after today)  MiraLAx prep all at once ok does not have to split dose   Take 2 Imodium 2 hours before scheduled colonoscopy time  Please let me know and I am ccing Dr. Baxter Flattery

## 2018-05-18 NOTE — Telephone Encounter (Signed)
Patient is scheudled for 05/21/18 at 10:30 at [facility].  She will need to arrive at 9:00.  Patient is not at home and will call me back when she gets home and has locates her instructions to review the details.

## 2018-05-18 NOTE — Telephone Encounter (Signed)
I reviewed the instructions with the patient. She verbalized understanding of all instructions.

## 2018-05-19 ENCOUNTER — Other Ambulatory Visit: Payer: Self-pay

## 2018-05-19 ENCOUNTER — Encounter (HOSPITAL_COMMUNITY): Payer: Self-pay | Admitting: *Deleted

## 2018-05-20 NOTE — Anesthesia Preprocedure Evaluation (Addendum)
Anesthesia Evaluation  Patient identified by MRN, date of birth, ID band Patient awake    Reviewed: Allergy & Precautions, H&P , NPO status , Patient's Chart, lab work & pertinent test results, reviewed documented beta blocker date and time   Airway Mallampati: II  TM Distance: >3 FB Neck ROM: full    Dental no notable dental hx. (+) Partial Upper, Missing, Poor Dentition, Caps,    Pulmonary neg pulmonary ROS,    Pulmonary exam normal breath sounds clear to auscultation       Cardiovascular Exercise Tolerance: Good hypertension, Pt. on medications and Pt. on home beta blockers  Rhythm:regular Rate:Normal     Neuro/Psych  Headaches, Anxiety    GI/Hepatic Neg liver ROS, GERD  ,  Endo/Other  negative endocrine ROS  Renal/GU negative Renal ROS  negative genitourinary   Musculoskeletal   Abdominal   Peds  Hematology negative hematology ROS (+)   Anesthesia Other Findings   Reproductive/Obstetrics negative OB ROS                            Anesthesia Physical Anesthesia Plan  ASA: II  Anesthesia Plan: MAC   Post-op Pain Management:    Induction: Intravenous  PONV Risk Score and Plan:   Airway Management Planned: Mask and Natural Airway  Additional Equipment:   Intra-op Plan:   Post-operative Plan:   Informed Consent: I have reviewed the patients History and Physical, chart, labs and discussed the procedure including the risks, benefits and alternatives for the proposed anesthesia with the patient or authorized representative who has indicated his/her understanding and acceptance.   Dental Advisory Given  Plan Discussed with: CRNA, Anesthesiologist and Surgeon  Anesthesia Plan Comments:         Anesthesia Quick Evaluation

## 2018-05-21 ENCOUNTER — Other Ambulatory Visit: Payer: Self-pay

## 2018-05-21 ENCOUNTER — Ambulatory Visit (HOSPITAL_COMMUNITY): Payer: Medicare Other | Admitting: Anesthesiology

## 2018-05-21 ENCOUNTER — Encounter (HOSPITAL_COMMUNITY)
Admission: RE | Disposition: A | Discharge status: Home / Self Care | Payer: Self-pay | Source: Ambulatory Visit | Attending: Internal Medicine

## 2018-05-21 ENCOUNTER — Encounter (HOSPITAL_COMMUNITY): Payer: Self-pay | Admitting: *Deleted

## 2018-05-21 ENCOUNTER — Ambulatory Visit (HOSPITAL_COMMUNITY)
Admission: RE | Admit: 2018-05-21 | Discharge: 2018-05-21 | Disposition: A | Discharge status: Home / Self Care | Payer: Medicare Other | Source: Ambulatory Visit | Attending: Internal Medicine | Admitting: Internal Medicine

## 2018-05-21 DIAGNOSIS — I10 Essential (primary) hypertension: Secondary | ICD-10-CM | POA: Diagnosis not present

## 2018-05-21 DIAGNOSIS — Z9049 Acquired absence of other specified parts of digestive tract: Secondary | ICD-10-CM | POA: Diagnosis not present

## 2018-05-21 DIAGNOSIS — K589 Irritable bowel syndrome without diarrhea: Secondary | ICD-10-CM | POA: Diagnosis not present

## 2018-05-21 DIAGNOSIS — Z888 Allergy status to other drugs, medicaments and biological substances status: Secondary | ICD-10-CM | POA: Insufficient documentation

## 2018-05-21 DIAGNOSIS — Z800 Family history of malignant neoplasm of digestive organs: Secondary | ICD-10-CM | POA: Diagnosis not present

## 2018-05-21 DIAGNOSIS — E739 Lactose intolerance, unspecified: Secondary | ICD-10-CM | POA: Diagnosis not present

## 2018-05-21 DIAGNOSIS — Z90710 Acquired absence of both cervix and uterus: Secondary | ICD-10-CM | POA: Diagnosis not present

## 2018-05-21 DIAGNOSIS — K219 Gastro-esophageal reflux disease without esophagitis: Secondary | ICD-10-CM | POA: Diagnosis not present

## 2018-05-21 DIAGNOSIS — F419 Anxiety disorder, unspecified: Secondary | ICD-10-CM | POA: Diagnosis not present

## 2018-05-21 DIAGNOSIS — A0471 Enterocolitis due to Clostridium difficile, recurrent: Secondary | ICD-10-CM | POA: Diagnosis not present

## 2018-05-21 DIAGNOSIS — Z807 Family history of other malignant neoplasms of lymphoid, hematopoietic and related tissues: Secondary | ICD-10-CM | POA: Insufficient documentation

## 2018-05-21 DIAGNOSIS — K5730 Diverticulosis of large intestine without perforation or abscess without bleeding: Secondary | ICD-10-CM | POA: Insufficient documentation

## 2018-05-21 DIAGNOSIS — R002 Palpitations: Secondary | ICD-10-CM | POA: Insufficient documentation

## 2018-05-21 DIAGNOSIS — Z79899 Other long term (current) drug therapy: Secondary | ICD-10-CM | POA: Diagnosis not present

## 2018-05-21 DIAGNOSIS — R51 Headache: Secondary | ICD-10-CM | POA: Diagnosis not present

## 2018-05-21 HISTORY — PX: COLONOSCOPY WITH PROPOFOL: SHX5780

## 2018-05-21 HISTORY — PX: FECAL TRANSPLANT: SHX6383

## 2018-05-21 SURGERY — COLONOSCOPY WITH PROPOFOL
Anesthesia: Monitor Anesthesia Care

## 2018-05-21 MED ORDER — PHENYLEPHRINE 40 MCG/ML (10ML) SYRINGE FOR IV PUSH (FOR BLOOD PRESSURE SUPPORT)
PREFILLED_SYRINGE | INTRAVENOUS | Status: DC | PRN
  Administered 2018-05-21: 80 ug via INTRAVENOUS
  Administered 2018-05-21: 40 ug via INTRAVENOUS

## 2018-05-21 MED ORDER — LIDOCAINE 2% (20 MG/ML) 5 ML SYRINGE
INTRAMUSCULAR | Status: DC | PRN
  Administered 2018-05-21: 80 mg via INTRAVENOUS

## 2018-05-21 MED ORDER — SODIUM CHLORIDE 0.9 % IV SOLN: INTRAVENOUS | Status: DC

## 2018-05-21 MED ORDER — PROPOFOL 10 MG/ML IV BOLUS
INTRAVENOUS | Status: DC | PRN
  Administered 2018-05-21: 40 mg via INTRAVENOUS

## 2018-05-21 MED ORDER — PROPOFOL 500 MG/50ML IV EMUL
INTRAVENOUS | Status: DC | PRN
  Administered 2018-05-21: 100 ug/kg/min via INTRAVENOUS

## 2018-05-21 MED ORDER — PROPOFOL 10 MG/ML IV BOLUS
INTRAVENOUS | Status: AC
  Filled 2018-05-21: qty 40

## 2018-05-21 MED ORDER — LACTATED RINGERS IV SOLN
INTRAVENOUS | Status: DC
  Administered 2018-05-21: 10:00:00 via INTRAVENOUS

## 2018-05-21 SURGICAL SUPPLY — 23 items
ELECT REM PT RETURN 9FT ADLT (ELECTROSURGICAL)
ELECTRODE REM PT RTRN 9FT ADLT (ELECTROSURGICAL) IMPLANT
FCP BXJMBJMB 240X2.8X (CUTTING FORCEPS)
FLOOR PAD 36X40 (MISCELLANEOUS) ×4
FORCEPS BIOP RAD 4 LRG CAP 4 (CUTTING FORCEPS) IMPLANT
FORCEPS BIOP RJ4 240 W/NDL (CUTTING FORCEPS)
FORCEPS BXJMBJMB 240X2.8X (CUTTING FORCEPS) IMPLANT
INJECTOR/SNARE I SNARE (MISCELLANEOUS) IMPLANT
LUBRICANT JELLY 4.5OZ STERILE (MISCELLANEOUS) IMPLANT
MANIFOLD NEPTUNE II (INSTRUMENTS) IMPLANT
NDL SCLEROTHERAPY 25GX240 (NEEDLE) IMPLANT
NEEDLE SCLEROTHERAPY 25GX240 (NEEDLE) IMPLANT
PAD FLOOR 36X40 (MISCELLANEOUS) ×2 IMPLANT
PREP MICROBIOTA FECAL (Tissue) ×2 IMPLANT
PROBE APC STR FIRE (PROBE) IMPLANT
PROBE INJECTION GOLD (MISCELLANEOUS)
PROBE INJECTION GOLD 7FR (MISCELLANEOUS) IMPLANT
SNARE ROTATE MED OVAL 20MM (MISCELLANEOUS) IMPLANT
SYR 50ML LL SCALE MARK (SYRINGE) IMPLANT
TRAP SPECIMEN MUCOUS 40CC (MISCELLANEOUS) IMPLANT
TUBING ENDO SMARTCAP PENTAX (MISCELLANEOUS) IMPLANT
TUBING IRRIGATION ENDOGATOR (MISCELLANEOUS) ×4 IMPLANT
WATER STERILE IRR 1000ML POUR (IV SOLUTION) IMPLANT

## 2018-05-21 NOTE — Anesthesia Postprocedure Evaluation (Signed)
Anesthesia Post Note  Patient: Rhonda Andrews  Procedure(s) Performed: COLONOSCOPY WITH PROPOFOL (N/A )     Patient location during evaluation: PACU Anesthesia Type: MAC Level of consciousness: awake and alert Pain management: pain level controlled Vital Signs Assessment: post-procedure vital signs reviewed and stable Respiratory status: spontaneous breathing, nonlabored ventilation, respiratory function stable and patient connected to nasal cannula oxygen Cardiovascular status: stable and blood pressure returned to baseline Postop Assessment: no apparent nausea or vomiting Anesthetic complications: no    Last Vitals:  Vitals:   05/21/18 0945 05/21/18 1117  BP: (!) 188/97   Pulse: 86   Resp: (!) 21   Temp: (!) 36.4 C (!) 36.1 C  SpO2: 100%     Last Pain:  Vitals:   05/21/18 1150  TempSrc:   PainSc: 0-No pain                 Tosca Pletz

## 2018-05-21 NOTE — H&P (Signed)
[facility] History and Physical   Primary Care Physician:  Caren Macadam, MD   Reason for Procedure:   recurrent c diff  Plan:    Colonoscopy and FMT The risks and benefits as well as alternatives of endoscopic procedure(s) have been discussed and reviewed. All questions answered. The patient agrees to proceed.   HPI: Rhonda Andrews is a 77 y.o. female here for colonoscopy and fecal microbiotica transplant.   Past Medical History:  Diagnosis Date  . Anxiety   . 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 Clostridium difficile diarrhea 02/05/2015  . Dizziness   . 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 Hypertension   . IBS (irritable bowel syndrome)   . Lactose intolerance   . Palpitation   . Palpitations     Past Surgical History:  Procedure Laterality Date  . ABDOMINAL HYSTERECTOMY     ovaries remained  . CHOLECYSTECTOMY    . COLONOSCOPY  2010  . KNEE ARTHROSCOPY Right   . Precancerous  Tissue - Nose  February 14-2014    Prior to Admission medications   Medication Sig Start Date End Date Taking? Authorizing Provider  Calcium Carb-Cholecalciferol (CALCIUM + VITAMIN D3 PO) Take 1 tablet by mouth 2 (two) times daily.   Yes [provider]  Cholecalciferol (VITAMIN D) 2000 units tablet Take 2,000 Units by mouth daily.    Yes [provider]  ondansetron (ZOFRAN) 4 MG tablet Take 1 tablet (4 mg total) by mouth every 8 (eight) hours as needed for nausea or vomiting. 04/21/18  Yes Carlyle Basques, MD  propranolol ER (INDERAL LA) 60 MG 24 hr capsule Take 1 capsule (60 mg total) by mouth daily. Patient taking differently: Take 30 mg by mouth every evening. Takes propranolol er 01/11/18  Yes Branch, Alphonse Guild, MD  Vancomycin HCl [facility]) 50 MG/ML SOLR Take 2.5 mLs (125 mg total) by mouth 2 (two) times daily. 04/21/18  Yes Carlyle Basques, MD  fluticasone [facility]) 50 MCG/ACT nasal spray Place 2 sprays daily into both nostrils. Patient not taking: Reported  on 05/18/2018 07/17/17   Caren Macadam, MD  meclizine (ANTIVERT) 25 MG tablet Take 1 tablet (25 mg total) by mouth 3 (three) times daily as needed for dizziness. Patient not taking: Reported on 05/18/2018 06/22/17   Butch Penny, NP    Current Facility-Administered Medications  Medication Dose Route Frequency Provider Last Rate Last Dose  . 0.9 %  sodium chloride infusion   Intravenous Continuous Gatha Mayer, MD      . lactated ringers infusion   Intravenous Continuous Gatha Mayer, MD 20 mL/hr at 05/21/18 0539      Allergies as of 05/18/2018 - Review Complete 05/18/2018  Allergen Reaction Noted  . Prednisone Other (See Comments) 10/28/2014    Family History  Problem Relation Age of Onset  . Lymphoma Mother   . Colon cancer Mother     Social History   Socioeconomic History  . Marital status: Married    Spouse name: Not on file  . Number of children: 1  . Years of education: Not on file  . Highest education level: Some college, no degree  Occupational History  . Not on file  Social Needs  . Financial resource strain: Not on file  . Food insecurity:    Worry: Not on file    Inability: Not on file  . Transportation needs:    Medical: Not on file    Non-medical: Not on file  Tobacco Use  .  Smoking status: Never Smoker  . Smokeless tobacco: Never Used  Substance and Sexual Activity  . Alcohol use: No    Alcohol/week: 0.0 standard drinks  . Drug use: No  . Sexual activity: Not on file  Lifestyle  . Physical activity:    Days per week: 0 days    Minutes per session: 0 min  . Stress: Only a little  Relationships  . Social connections:    Talks on phone: More than three times a week    Gets together: More than three times a week    Attends religious service: More than 4 times per year    Active member of club or organization: Yes    Attends meetings of clubs or organizations: More than 4 times per year    Relationship status: Married  . Intimate partner  violence:    Fear of current or ex partner: No    Emotionally abused: No    Physically abused: No    Forced sexual activity: No  Other Topics Concern  . Not on file  Social History Narrative   Married. Has a daughter. 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   Eats all food groups and has a great appetite.    Walks in house daily in her house, 1.5 miles a day.   Right handed   Quit drinking caffeine 15 years ago   No EtOH/tobacco    Review of Systems:  All other review of systems negative except as mentioned in the HPI.  Physical Exam: Vital signs in last 24 hours: Temp:  [97.5 F (36.4 C)] 97.5 F (36.4 C) (09/20 0945) Pulse Rate:  [86] 86 (09/20 0945) Resp:  [21] 21 (09/20 0945) BP: (188)/(97) 188/97 (09/20 0945) SpO2:  [100 %] 100 % (09/20 0945) Weight:  [62.1 kg] 62.1 kg (09/20 0945)   General:   Alert,  Well-developed, well-nourished, pleasant and cooperative in NAD Lungs:  Clear throughout to auscultation.   Heart:  Regular rate and rhythm; no murmurs, clicks, rubs,  or gallops. Abdomen:  Soft, nontender and nondistended. Normal bowel sounds.   Neuro/Psych:  Alert and cooperative. Normal mood and affect. A and O x 3   @[name]  Simonne Maffucci, MD, [facility] [phone] (pager) 05/21/2018 10:50 AM@

## 2018-05-21 NOTE — Anesthesia Procedure Notes (Signed)
Date/Time: 05/21/2018 10:54 AM Performed by: Claudia Desanctis, CRNA Oxygen Delivery Method: Nasal cannula

## 2018-05-21 NOTE — Discharge Instructions (Addendum)
° °  The fecal transplant was successful.  Hope it works and no more problems.  Let me know if return of diarrhea.    I appreciate the opportunity to care for you. Gatha Mayer, MD, FACG  YOU HAD AN ENDOSCOPIC PROCEDURE TODAY: Refer to the procedure report and other information in the discharge instructions given to you for any specific questions about what was found during the examination. If this information does not answer your questions, please call Dr. Celesta Aver office at [phone] to clarify.   YOU SHOULD EXPECT: Some feelings of bloating in the abdomen. Passage of more gas than usual. Walking can help get rid of the air that was put into your GI tract during the procedure and reduce the bloating. If you had a lower endoscopy (such as a colonoscopy or flexible sigmoidoscopy) you may notice spotting of blood in your stool or on the toilet paper. Some abdominal soreness may be present for a day or two, also.  DIET: Your first meal following the procedure should be a light meal and then it is ok to progress to your normal diet. A half-sandwich or bowl of soup is an example of a good first meal. Heavy or fried foods are harder to digest and may make you feel nauseous or bloated. Drink plenty of fluids but you should avoid alcoholic beverages for 24 hours.   ACTIVITY: Your care partner should take you home directly after the procedure. You should plan to take it easy, moving slowly for the rest of the day. You can resume normal activity the day after the procedure however YOU SHOULD NOT DRIVE, use power tools, machinery or perform tasks that involve climbing or major physical exertion for 24 hours (because of the sedation medicines used during the test).   SYMPTOMS TO REPORT IMMEDIATELY: A gastroenterologist can be reached at any hour. Please call [phone]  for any of the following symptoms:  Following lower endoscopy (colonoscopy, flexible sigmoidoscopy) Excessive amounts of  blood in the stool  Significant tenderness, worsening of abdominal pains  Swelling of the abdomen that is new, acute  Fever of 100 or higher

## 2018-05-21 NOTE — Transfer of Care (Signed)
Immediate Anesthesia Transfer of Care Note  Patient: Rhonda Andrews  Procedure(s) Performed: COLONOSCOPY WITH PROPOFOL (N/A )  Patient Location: PACU  Anesthesia Type:MAC  Level of Consciousness: awake, alert  and oriented  Airway & Oxygen Therapy: Patient Spontanous Breathing and Patient connected to nasal cannula oxygen  Post-op Assessment: Report given to RN and Post -op Vital signs reviewed and stable  Post vital signs: Reviewed and stable  Last Vitals:  Vitals Value Taken Time  BP 111/56 05/21/2018 11:20 AM  Temp    Pulse 71 05/21/2018 11:22 AM  Resp 16 05/21/2018 11:22 AM  SpO2 100 % 05/21/2018 11:22 AM  Vitals shown include unvalidated device data.  Last Pain:  Vitals:   05/21/18 1117  TempSrc:   PainSc: 0-No pain         Complications: No apparent anesthesia complications

## 2018-05-21 NOTE — Op Note (Signed)
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 Estimated Blood Loss:     Estimated blood loss: none. Procedure:                Pre-Anesthesia Assessment:                           - Prior to the procedure, a History and Physical                            was performed, and patient medications and                            allergies were reviewed. The patient's tolerance of                            previous anesthesia was also reviewed. The risks                            and benefits of the procedure and the sedation                            options and risks were discussed with the patient.                            All questions were answered, and informed consent                            was obtained. Prior Anticoagulants: The patient has                            taken no previous anticoagulant or antiplatelet                            agents. ASA Grade Assessment: II - A patient with  mild systemic disease. After reviewing the risks                            and benefits, the patient was deemed in                            satisfactory condition to undergo the procedure.                           After obtaining informed consent, the colonoscope                            was passed under direct vision. Throughout the   procedure, the patient's blood pressure, pulse, and                            oxygen saturations were monitored continuously. The                            PCF-H190DL ([id number]) Olympus peds colonoscope was                            introduced through the anus and advanced to the the                            terminal ileum, with identification of the                            appendiceal orifice and IC valve. The colonoscopy                            was performed without difficulty. The patient                            tolerated the procedure well. The quality of the                            bowel preparation was good. The terminal ileum, the                            ileocecal valve and the appendiceal orifice were                            photographed. Scope In: 10:59:55 AM Scope Out: 11:05:42 AM Scope Withdrawal Time: 0 hours 2 minutes 19 seconds  Total Procedure Duration: 0 hours 5 minutes 47 seconds  Findings:      The perianal and digital rectal examinations were normal.      A few diverticula were found in the sigmoid colon.      The terminal ileum appeared normal.      The exam was otherwise without abnormality on direct and retroflexion       views.      Fecal Microbiota Transplant (Bacteriotherapy): Donor stool was prepared       by a third party (purchased) as per protocol. Approximately 450  mL of       the emulsified donor stool was instilled in the terminal ileum. A       detailed colonoscopic exam could not be performed upon scope withdrawal       secondary to limited visibility from the instilled stool. Impression:               - Diverticulosis in the sigmoid colon.                           - The examined portion of the ileum was normal.                           - The examination was otherwise normal on direct                            and retroflexion views. LIMITED EXAM - WAS FOR FMT                            AND NOT DIAGNOSTICS                            - Fecal Microbiota Transplant (Bacteriotherapy)                            performed in the terminal ileum.                           - No specimens collected. Moderate Sedation:      N/A- Per Anesthesia Care Recommendation:           - Patient has a contact number available for                            emergencies. The signs and symptoms of potential                            delayed complications were discussed with the                            patient. Return to normal activities tomorrow.                            Written discharge instructions were provided to the                            patient.                           - Resume previous diet.                           - Continue present medications.                           - No repeat colonoscopy due to current age (64  years or older).                           - take 2 Imodium when she gets home                           Cautious abx use in future Procedure Code(s):        --- Professional ---                           [phone], Preparation with instillation of fecal                            microbiota by any method, including assessment of                            donor specimen Diagnosis Code(s):        --- Professional ---                           A04.71, Enterocolitis due to Clostridium difficile,                            recurrent                           K57.30, Diverticulosis of large intestine without                            perforation or abscess without bleeding CPT copyright 2017 American Medical Association. All rights reserved. The codes documented in this report are preliminary and upon coder review may  be revised to meet current compliance requirements. Gatha Mayer, MD 05/21/2018 11:17:32 AM This report has been signed electronically. Number of Addenda: 0

## 2018-05-24 ENCOUNTER — Encounter (HOSPITAL_COMMUNITY): Payer: Self-pay | Admitting: Internal Medicine

## 2018-05-26 ENCOUNTER — Telehealth: Payer: Self-pay

## 2018-05-26 NOTE — Telephone Encounter (Signed)
Patient called today requesting that Dr. Baxter Flattery call regarding fecal transplant. Will route message to Dr. Baxter Flattery to give patient call back. [place]

## 2018-06-07 ENCOUNTER — Ambulatory Visit (INDEPENDENT_AMBULATORY_CARE_PROVIDER_SITE_OTHER): Payer: Medicare Other | Admitting: Internal Medicine

## 2018-06-07 ENCOUNTER — Encounter (INDEPENDENT_AMBULATORY_CARE_PROVIDER_SITE_OTHER): Payer: Self-pay | Admitting: Internal Medicine

## 2018-06-07 VITALS — BP 142/82 | HR 64 | Temp 97.6°F | Ht 64.0 in | Wt 135.6 lb

## 2018-06-07 DIAGNOSIS — A0472 Enterocolitis due to Clostridium difficile, not specified as recurrent: Secondary | ICD-10-CM

## 2018-06-07 NOTE — Progress Notes (Signed)
Subjective:    Patient ID: Rhonda Andrews, female    DOB: 1940-09-06, 77 y.o.   MRN: [id number]  HPI Here today for f/u. She underwent a colonoscopy 05/21/2018 by Dr. Carlean Purl for fecal transplant for tx of recurrent C diff colitis. Impression:               - Diverticulosis in the sigmoid colon.                           - The examined portion of the ileum was normal.                           - The examination was otherwise normal on direct                            and retroflexion views. LIMITED EXAM - WAS FOR FMT                            AND NOT DIAGNOSTICS                           - Fecal Microbiota Transplant (Bacteriotherapy)                            performed in the terminal ileum.   She says she is doing good. She is having a BM every other day. Has had C-diff she thinks about 5 times. Husband has hx of C-diff. Her appetite is good. She has gained about 2.5 pounds. No abdominal pain. She says she feels better.  She is worried about her blood pressure fluctuating.    Review of Systems Past Medical History:  Diagnosis Date  . Anxiety   . 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 Clostridium difficile diarrhea 02/05/2015  . Dizziness   . 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 Hypertension   . IBS (irritable bowel syndrome)   . Lactose intolerance   . Palpitation   . Palpitations     Past Surgical History:  Procedure Laterality Date  . ABDOMINAL HYSTERECTOMY     ovaries remained  . CHOLECYSTECTOMY    . COLONOSCOPY  2010  . COLONOSCOPY WITH PROPOFOL N/A 05/21/2018   Procedure: COLONOSCOPY WITH PROPOFOL;  Surgeon: Gatha Mayer, MD;  Location: [facility];  Service: Endoscopy;  Laterality: N/A;  with FMT  . 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 KNEE ARTHROSCOPY Right   . Precancerous  Tissue - Nose  February 14-2014    Allergies  Allergen Reactions  . Hydrocodone  Nausea And Vomiting  . Prednisone Other (See Comments)    Heart races    Current Outpatient Medications on File Prior to Visit  Medication Sig Dispense Refill  . Calcium Carb-Cholecalciferol (CALCIUM + VITAMIN D3 PO) Take 1 tablet by mouth 2 (two) times daily.    . Cholecalciferol (VITAMIN D) 2000 units tablet Take 2,000 Units by mouth daily.     . ondansetron (ZOFRAN) 4 MG tablet Take 1 tablet (4 mg total) by mouth every 8 (eight) hours as needed for nausea or vomiting. 20 tablet 0  . propranolol ER (INDERAL LA) 60 MG 24  hr capsule Take 1 capsule (60 mg total) by mouth daily. (Patient taking differently: Take 30 mg by mouth every evening. Takes propranolol er) 90 capsule 3   No current facility-administered medications on file prior to visit.         Objective:   Physical Exam Blood pressure (!) 142/82, pulse 64, temperature 97.6 F (36.4 C), height 5\' 4"  (1.626 m), weight 135 lb 9.6 oz (61.5 kg). Alert and oriented. Skin warm and dry. Oral mucosa is moist.   . Sclera anicteric, conjunctivae is pink. Thyroid not enlarged. No cervical lymphadenopathy. Lungs clear. Heart regular rate and rhythm.  Abdomen is soft. Bowel sounds are positive. No hepatomegaly. No abdominal masses felt. No tenderness.  No edema to lower extremities.        Assessment & Plan:  C diff. She is doing better. Had fecal transplant in September. Having a stool every other day. Will see back in a year. If any problems, she will call our office.

## 2018-06-07 NOTE — Patient Instructions (Addendum)
OV in 1 year.  

## 2018-06-28 ENCOUNTER — Telehealth: Payer: Self-pay | Admitting: Cardiology

## 2018-06-28 NOTE — Telephone Encounter (Signed)
Patient feels that her BP has been to low for her the past 6 weeks.   Made appointment for her to see Branch with his first available in Nov.  Please call patient to discuss BP

## 2018-06-28 NOTE — Telephone Encounter (Signed)
Pt says she splits her capsule and takes 30 mg of inderal and notes BP 85/55 with HR 60-70  If she doesn't take it her BP is ok , but HR is 90-100    States she sometimes takes Inderal every other day

## 2018-06-29 NOTE — Telephone Encounter (Signed)
Any reasons she would be dehydrated. I see some notes in her chart about c diff, has she been having diarrhea? Any nausease or vomiting, decreased oral hydration. How much water a day does she drink.    Zandra Abts MD

## 2018-06-29 NOTE — Telephone Encounter (Signed)
Returned pt call. She states that she has not had c diff since earlier in the year. She has not had any nausea, vomiting or diarrhea in a long while. She stated that she drinks at least 80 oz of water daily .

## 2018-06-30 DIAGNOSIS — I10 Essential (primary) hypertension: Secondary | ICD-10-CM | POA: Diagnosis not present

## 2018-06-30 DIAGNOSIS — R5383 Other fatigue: Secondary | ICD-10-CM | POA: Diagnosis not present

## 2018-06-30 DIAGNOSIS — R7303 Prediabetes: Secondary | ICD-10-CM | POA: Diagnosis not present

## 2018-06-30 DIAGNOSIS — R42 Dizziness and giddiness: Secondary | ICD-10-CM | POA: Diagnosis not present

## 2018-06-30 NOTE — Telephone Encounter (Signed)
Its the propanolol that's causing it to get low. An option is to repeat her heart monitor, if she we a certain type of abnormal heart rhythm she may be a candidate for something called an ablation procedure which could cure her palpitations and she would no longer need to take propanolol. If she is interested would order a 2 week event monitor for palpitations.   Zandra Abts MD

## 2018-07-06 ENCOUNTER — Ambulatory Visit: Payer: Medicare Other | Admitting: Internal Medicine

## 2018-07-15 ENCOUNTER — Ambulatory Visit (INDEPENDENT_AMBULATORY_CARE_PROVIDER_SITE_OTHER): Payer: Medicare Other | Admitting: Internal Medicine

## 2018-07-15 ENCOUNTER — Encounter: Payer: Self-pay | Admitting: Internal Medicine

## 2018-07-15 VITALS — BP 147/74 | HR 68 | Temp 97.8°F | Ht 64.0 in | Wt 139.0 lb

## 2018-07-15 DIAGNOSIS — A0472 Enterocolitis due to Clostridium difficile, not specified as recurrent: Secondary | ICD-10-CM

## 2018-07-15 NOTE — Progress Notes (Signed)
No longer having episodes of diarrhea

## 2018-07-15 NOTE — Progress Notes (Signed)
   RFV: follow up for fecal transplant Patient ID: Rhonda Andrews, female   DOB: 07-03-1941, 77 y.o.   MRN: [id number]  HPI It has been almost 60 days since FMT. Doing well. No diarrhea. Has some issues with symptomatic hypertension  Outpatient Encounter Medications as of 07/15/2018  Medication Sig  . Calcium Carb-Cholecalciferol (CALCIUM + VITAMIN D3 PO) Take 1 tablet by mouth 2 (two) times daily.  . Cholecalciferol (VITAMIN D) 2000 units tablet Take 2,000 Units by mouth daily.   . ondansetron (ZOFRAN) 4 MG tablet Take 1 tablet (4 mg total) by mouth every 8 (eight) hours as needed for nausea or vomiting.  . propranolol ER (INDERAL LA) 60 MG 24 hr capsule Take 1 capsule (60 mg total) by mouth daily. (Patient taking differently: Take 30 mg by mouth every evening. Takes propranolol er)   No facility-administered encounter medications on file as of 07/15/2018.      Patient Active Problem List   Diagnosis Date Noted  . 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 Elevated troponin 06/15/2017  . 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 Essential hypertension   . Nonintractable headache   . Vertigo   . Dizziness 06/10/2017  . Bloating 11/24/2013  . 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 GERD (gastroesophageal reflux disease) 11/03/2013  . Diarrhea 04/28/2012  . ANXIETY 05/06/2009  . IBS 05/06/2009  . CHEST PAIN, ATYPICAL 05/06/2009  . PALPITATIONS 04/30/2009     Health Maintenance Due  Topic Date Due  . DEXA SCAN  03/31/2008  . TETANUS/TDAP  10/11/2015  . INFLUENZA VACCINE  04/01/2018     Review of Systems No diarrhea, occasionally feels headache with BP Physical Exam   BP (!) 147/74   Pulse 68   Temp 97.8 F (36.6 C)   Ht 5\' 4"  (1.626 m)   Wt 139 lb (63 kg)   BMI 23.86 kg/m    Did not examine CBC Lab Results  Component Value Date   WBC 6.9 06/15/2017   RBC 5.01 06/15/2017   HGB 15.0 06/15/2017   HCT 45.4 06/15/2017   PLT 209  06/15/2017   MCV 90.6 06/15/2017   MCH 29.9 06/15/2017   MCHC 33.0 06/15/2017   RDW 13.1 06/15/2017   LYMPHSABS 2.3 06/15/2017   MONOABS 0.5 06/15/2017   EOSABS 0.1 06/15/2017    BMET Lab Results  Component Value Date   NA 140 08/17/2017   K 4.2 08/17/2017   CL 102 08/17/2017   CO2 31 08/17/2017   GLUCOSE 111 08/17/2017   BUN 22 08/17/2017   CREATININE 0.94 (H) 08/17/2017   CALCIUM 9.2 08/17/2017   GFRNONAA 60 08/17/2017   GFRAA 69 08/17/2017      Assessment and Plan Hx of cdiffcile colitis s/p FMT= cured at 60 day mark. Recommend to Avoid unnecesary abtx  Return if needed

## 2018-07-20 ENCOUNTER — Ambulatory Visit (INDEPENDENT_AMBULATORY_CARE_PROVIDER_SITE_OTHER): Payer: Medicare Other | Admitting: Cardiology

## 2018-07-20 ENCOUNTER — Encounter: Payer: Self-pay | Admitting: Cardiology

## 2018-07-20 VITALS — BP 118/68 | HR 82 | Ht 64.0 in | Wt 136.0 lb

## 2018-07-20 DIAGNOSIS — R002 Palpitations: Secondary | ICD-10-CM

## 2018-07-20 MED ORDER — MIDODRINE HCL 2.5 MG PO TABS: 2.5000 mg | ORAL_TABLET | Freq: Two times a day (BID) | ORAL | 3 refills | Status: DC

## 2018-07-20 NOTE — Patient Instructions (Signed)
Medication Instructions:  Start midodrine 2.5 mg- two times daily   Labwork: none  Testing/Procedures: none  Follow-Up: Your physician recommends that you schedule a follow-up appointment in: 2 months   Any Other Special Instructions Will Be Listed Below (If Applicable).     If you need a refill on your cardiac medications before your next appointment, please call your pharmacy.

## 2018-07-20 NOTE — Progress Notes (Signed)
Clinical Summary Rhonda Andrews is a 77 y.o.female seen today for follow up of the following medical problems.   1. Palpitations/HTN - previous holter 05/2009 showed no significant arrhythmias.  - during prior admission did have short episode of PSVT - she was changed from propanolol to atenolol. With change had increase in symptoms and she changed herself back to propanolol - she reports short acting propanolol does not work as well, prefers long acting.  - diltiazemcausedlow bp'sat home per her report, she stopped taking.   - significant palpitations if she does not take her propanolol, this essentially has been the only medication that works but seems to lower her bp's significantly at times.    Past Medical History:  Diagnosis Date  . Anxiety   . 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 Clostridium difficile diarrhea 02/05/2015  . Dizziness   . 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 Hypertension   . IBS (irritable bowel syndrome)   . Lactose intolerance   . Palpitation   . Palpitations      Allergies  Allergen Reactions  . Hydrocodone Nausea And Vomiting  . Prednisone Other (See Comments)    Heart races     Current Outpatient Medications  Medication Sig Dispense Refill  . Calcium Carb-Cholecalciferol (CALCIUM + VITAMIN D3 PO) Take 1 tablet by mouth 2 (two) times daily.    . Cholecalciferol (VITAMIN D) 2000 units tablet Take 2,000 Units by mouth daily.     . ondansetron (ZOFRAN) 4 MG tablet Take 1 tablet (4 mg total) by mouth every 8 (eight) hours as needed for nausea or vomiting. 20 tablet 0  . propranolol ER (INDERAL LA) 60 MG 24 hr capsule Take 1 capsule (60 mg total) by mouth daily. (Patient taking differently: Take 30 mg by mouth every evening. Takes propranolol er) 90 capsule 3   No current facility-administered medications for this visit.      Past Surgical History:  Procedure Laterality Date  . ABDOMINAL HYSTERECTOMY     ovaries remained  .  CHOLECYSTECTOMY    . COLONOSCOPY  2010  . COLONOSCOPY WITH PROPOFOL N/A 05/21/2018   Procedure: COLONOSCOPY WITH PROPOFOL;  Surgeon: Gatha Mayer, MD;  Location: [facility];  Service: Endoscopy;  Laterality: N/A;  with FMT  . 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 KNEE ARTHROSCOPY Right   . Precancerous  Tissue - Nose  February 14-2014     Allergies  Allergen Reactions  . Hydrocodone Nausea And Vomiting  . Prednisone Other (See Comments)    Heart races      Family History  Problem Relation Age of Onset  . Lymphoma Mother   . Colon cancer Mother      Social History Rhonda Andrews reports that she has never smoked. She has never used smokeless tobacco. Rhonda Andrews reports that she does not drink alcohol.   Review of Systems CONSTITUTIONAL: No weight loss, fever, chills, weakness or fatigue.  HEENT: Eyes: No visual loss, blurred vision, double vision or yellow sclerae.No hearing loss, sneezing, congestion, runny nose or sore throat.  SKIN: No rash or itching.  CARDIOVASCULAR: per hpi RESPIRATORY: No shortness of breath, cough or sputum.  GASTROINTESTINAL: No anorexia, nausea, vomiting or diarrhea. No abdominal pain or blood.  GENITOURINARY: No burning on urination, no polyuria NEUROLOGICAL: +dizziness MUSCULOSKELETAL: No muscle, back pain, joint pain or stiffness.  LYMPHATICS: No enlarged nodes.  No history of splenectomy.  PSYCHIATRIC: No history of depression or anxiety.  ENDOCRINOLOGIC: No reports of sweating, cold or heat intolerance. No polyuria or polydipsia.  Marland Kitchen   Physical Examination Vitals:   07/20/18 1313  BP: 118/68  Pulse: 82  SpO2: 97%   Vitals:   07/20/18 1313  Weight: 136 lb (61.7 kg)  Height: 5\' 4"  (1.626 m)    Gen: resting comfortably, no acute distress HEENT: no scleral icterus, pupils equal round and reactive, no palptable cervical  adenopathy,  CV: RRR, no m/r/g,no jvd Resp: Clear to auscultation bilaterally GI: abdomen is soft, non-tender, non-distended, normal bowel sounds, no hepatosplenomegaly MSK: extremities are warm, no edema.  Skin: warm, no rash Neuro:  no focal deficits Psych: appropriate affect     Assessment and Plan   1. Palpitations - significant symptoms if she does not take her propanolol, historically this is the only medication that has worked. But this seems to significantly lower her bp's as well - will try adding midodrine 2.5mg  bid to see if stabilizes her bp to allow her to take her propanolol. Seems to be the only solution to try to limit her symptoms.  - orthostatics negative today     Arnoldo Lenis, M.D.

## 2018-07-22 ENCOUNTER — Encounter: Payer: Self-pay | Admitting: Cardiology

## 2018-08-02 DIAGNOSIS — R42 Dizziness and giddiness: Secondary | ICD-10-CM | POA: Diagnosis not present

## 2018-08-05 DIAGNOSIS — M9902 Segmental and somatic dysfunction of thoracic region: Secondary | ICD-10-CM | POA: Diagnosis not present

## 2018-08-05 DIAGNOSIS — M9903 Segmental and somatic dysfunction of lumbar region: Secondary | ICD-10-CM | POA: Diagnosis not present

## 2018-08-05 DIAGNOSIS — G441 Vascular headache, not elsewhere classified: Secondary | ICD-10-CM | POA: Diagnosis not present

## 2018-08-05 DIAGNOSIS — M9901 Segmental and somatic dysfunction of cervical region: Secondary | ICD-10-CM | POA: Diagnosis not present

## 2018-08-05 DIAGNOSIS — M546 Pain in thoracic spine: Secondary | ICD-10-CM | POA: Diagnosis not present

## 2018-08-05 DIAGNOSIS — S338XXA Sprain of other parts of lumbar spine and pelvis, initial encounter: Secondary | ICD-10-CM | POA: Diagnosis not present

## 2018-08-10 DIAGNOSIS — M9901 Segmental and somatic dysfunction of cervical region: Secondary | ICD-10-CM | POA: Diagnosis not present

## 2018-08-10 DIAGNOSIS — M546 Pain in thoracic spine: Secondary | ICD-10-CM | POA: Diagnosis not present

## 2018-08-10 DIAGNOSIS — G441 Vascular headache, not elsewhere classified: Secondary | ICD-10-CM | POA: Diagnosis not present

## 2018-08-10 DIAGNOSIS — S338XXA Sprain of other parts of lumbar spine and pelvis, initial encounter: Secondary | ICD-10-CM | POA: Diagnosis not present

## 2018-08-10 DIAGNOSIS — M9902 Segmental and somatic dysfunction of thoracic region: Secondary | ICD-10-CM | POA: Diagnosis not present

## 2018-08-10 DIAGNOSIS — M9903 Segmental and somatic dysfunction of lumbar region: Secondary | ICD-10-CM | POA: Diagnosis not present

## 2018-08-17 DIAGNOSIS — M546 Pain in thoracic spine: Secondary | ICD-10-CM | POA: Diagnosis not present

## 2018-08-17 DIAGNOSIS — S338XXA Sprain of other parts of lumbar spine and pelvis, initial encounter: Secondary | ICD-10-CM | POA: Diagnosis not present

## 2018-08-17 DIAGNOSIS — M9902 Segmental and somatic dysfunction of thoracic region: Secondary | ICD-10-CM | POA: Diagnosis not present

## 2018-08-17 DIAGNOSIS — G441 Vascular headache, not elsewhere classified: Secondary | ICD-10-CM | POA: Diagnosis not present

## 2018-08-17 DIAGNOSIS — M9903 Segmental and somatic dysfunction of lumbar region: Secondary | ICD-10-CM | POA: Diagnosis not present

## 2018-08-17 DIAGNOSIS — M9901 Segmental and somatic dysfunction of cervical region: Secondary | ICD-10-CM | POA: Diagnosis not present

## 2018-08-30 DIAGNOSIS — M9902 Segmental and somatic dysfunction of thoracic region: Secondary | ICD-10-CM | POA: Diagnosis not present

## 2018-08-30 DIAGNOSIS — G441 Vascular headache, not elsewhere classified: Secondary | ICD-10-CM | POA: Diagnosis not present

## 2018-08-30 DIAGNOSIS — M546 Pain in thoracic spine: Secondary | ICD-10-CM | POA: Diagnosis not present

## 2018-08-30 DIAGNOSIS — M9901 Segmental and somatic dysfunction of cervical region: Secondary | ICD-10-CM | POA: Diagnosis not present

## 2018-08-30 DIAGNOSIS — S338XXA Sprain of other parts of lumbar spine and pelvis, initial encounter: Secondary | ICD-10-CM | POA: Diagnosis not present

## 2018-08-30 DIAGNOSIS — M9903 Segmental and somatic dysfunction of lumbar region: Secondary | ICD-10-CM | POA: Diagnosis not present

## 2018-09-06 DIAGNOSIS — M9901 Segmental and somatic dysfunction of cervical region: Secondary | ICD-10-CM | POA: Diagnosis not present

## 2018-09-06 DIAGNOSIS — S338XXA Sprain of other parts of lumbar spine and pelvis, initial encounter: Secondary | ICD-10-CM | POA: Diagnosis not present

## 2018-09-06 DIAGNOSIS — M546 Pain in thoracic spine: Secondary | ICD-10-CM | POA: Diagnosis not present

## 2018-09-06 DIAGNOSIS — M9902 Segmental and somatic dysfunction of thoracic region: Secondary | ICD-10-CM | POA: Diagnosis not present

## 2018-09-06 DIAGNOSIS — G441 Vascular headache, not elsewhere classified: Secondary | ICD-10-CM | POA: Diagnosis not present

## 2018-09-06 DIAGNOSIS — M9903 Segmental and somatic dysfunction of lumbar region: Secondary | ICD-10-CM | POA: Diagnosis not present

## 2018-09-10 ENCOUNTER — Encounter: Payer: Self-pay | Admitting: Cardiology

## 2018-09-10 ENCOUNTER — Ambulatory Visit (INDEPENDENT_AMBULATORY_CARE_PROVIDER_SITE_OTHER): Payer: Medicare Other | Admitting: Cardiology

## 2018-09-10 VITALS — BP 128/63 | HR 58 | Ht 64.0 in | Wt 139.0 lb

## 2018-09-10 DIAGNOSIS — R002 Palpitations: Secondary | ICD-10-CM

## 2018-09-10 DIAGNOSIS — R42 Dizziness and giddiness: Secondary | ICD-10-CM | POA: Diagnosis not present

## 2018-09-10 NOTE — Progress Notes (Signed)
Clinical Summary Rhonda Andrews is a 78 y.o.female seen today for follow up of the following medical problems.   1. Palpitations/Dizziness - previous holter 05/2009 showed no significant arrhythmias.  - duringprioradmission did haveshort episode of PSVT - she was changed from propanolol to atenolol. With change had increase in symptoms and she changed herself back to propanolol - she reports short acting propanolol does not work as well, prefers long acting. - diltiazemcausedlow bp'sat home per her report, she stopped taking.   - significant palpitations if she does not take her propanolol, this essentially has been the only medication that works but seems to lower her bp's significantly at times.   - tried midodine x 2 weeks, SBP 130s. Reports insomnia and she stopped taking. - no recent palpitations, no dizziness.   Past Medical History:  Diagnosis Date  . Anxiety   . 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 Clostridium difficile diarrhea 02/05/2015  . Dizziness   . 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 Hypertension   . IBS (irritable bowel syndrome)   . Lactose intolerance   . Palpitation   . Palpitations      Allergies  Allergen Reactions  . Hydrocodone Nausea And Vomiting  . Prednisone Other (See Comments)    Heart races     Current Outpatient Medications  Medication Sig Dispense Refill  . Calcium Carb-Cholecalciferol (CALCIUM + VITAMIN D3 PO) Take 1 tablet by mouth 2 (two) times daily.    . Cholecalciferol (VITAMIN D) 2000 units tablet Take 2,000 Units by mouth daily.     . midodrine (PROAMATINE) 2.5 MG tablet Take 1 tablet (2.5 mg total) by mouth 2 (two) times daily. 60 tablet 3  . ondansetron (ZOFRAN) 4 MG tablet Take 1 tablet (4 mg total) by mouth every 8 (eight) hours as needed for nausea or vomiting. 20 tablet 0  . propranolol ER (INDERAL LA) 60 MG 24 hr capsule Take 1 capsule (60 mg total) by mouth daily. (Patient taking differently: Take 30 mg by  mouth every evening. Takes propranolol er) 90 capsule 3   No current facility-administered medications for this visit.      Past Surgical History:  Procedure Laterality Date  . ABDOMINAL HYSTERECTOMY     ovaries remained  . CHOLECYSTECTOMY    . COLONOSCOPY  2010  . COLONOSCOPY WITH PROPOFOL N/A 05/21/2018   Procedure: COLONOSCOPY WITH PROPOFOL;  Surgeon: Gatha Mayer, MD;  Location: [facility];  Service: Endoscopy;  Laterality: N/A;  with FMT  . 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 KNEE ARTHROSCOPY Right   . Precancerous  Tissue - Nose  February 14-2014     Allergies  Allergen Reactions  . Hydrocodone Nausea And Vomiting  . Prednisone Other (See Comments)    Heart races      Family History  Problem Relation Age of Onset  . Lymphoma Mother   . Colon cancer Mother      Social History Ms. Kief reports that she has never smoked. She has never used smokeless tobacco. Ms. Berish reports no history of alcohol use.   Review of Systems CONSTITUTIONAL: No weight loss, fever, chills, weakness or fatigue.  HEENT: Eyes: No visual loss, blurred vision, double vision or yellow sclerae.No hearing loss, sneezing, congestion, runny nose or sore throat.  SKIN: No rash or itching.  CARDIOVASCULAR: per hpi RESPIRATORY: No shortness of breath, cough or  sputum.  GASTROINTESTINAL: No anorexia, nausea, vomiting or diarrhea. No abdominal pain or blood.  GENITOURINARY: No burning on urination, no polyuria NEUROLOGICAL: No headache, dizziness, syncope, paralysis, ataxia, numbness or tingling in the extremities. No change in bowel or bladder control.  MUSCULOSKELETAL: No muscle, back pain, joint pain or stiffness.  LYMPHATICS: No enlarged nodes. No history of splenectomy.  PSYCHIATRIC: No history of depression or anxiety.  ENDOCRINOLOGIC: No reports of sweating, cold or heat  intolerance. No polyuria or polydipsia.  Marland Kitchen   Physical Examination Vitals:   09/10/18 1552  BP: 128/63  Pulse: (!) 58  SpO2: 98%   Vitals:   09/10/18 1552  Weight: 139 lb (63 kg)  Height: 5\' 4"  (1.626 m)    Gen: resting comfortably, no acute distress HEENT: no scleral icterus, pupils equal round and reactive, no palptable cervical adenopathy,  CV: RRR, no m/r/g, no jvd Resp: Clear to auscultation bilaterally GI: abdomen is soft, non-tender, non-distended, normal bowel sounds, no hepatosplenomegaly MSK: extremities are warm, no edema.  Skin: warm, no rash Neuro:  no focal deficits Psych: appropriate affect   Assessment and Plan   1. Palpitations/Dizziness - significant symptoms if she does not take her propanolol, historically this is the only medication that has worked. But this seems to significantly lower her bp's as well - we tried midodrine which helped bp's but she reports insomnia while taking and stopped - no recent palpitations, low bp's, or dizziness. Remain off midodrine for now. If symptoms reoccur would consider retrial vs florinef.  F/u 6 months    Arnoldo Lenis, M.D.,

## 2018-09-10 NOTE — Patient Instructions (Signed)

## 2018-09-22 DIAGNOSIS — H43391 Other vitreous opacities, right eye: Secondary | ICD-10-CM | POA: Diagnosis not present

## 2018-11-23 DIAGNOSIS — M130 Polyarthritis, unspecified: Secondary | ICD-10-CM | POA: Diagnosis not present

## 2018-11-23 DIAGNOSIS — Z8679 Personal history of other diseases of the circulatory system: Secondary | ICD-10-CM | POA: Diagnosis not present

## 2018-11-23 DIAGNOSIS — R42 Dizziness and giddiness: Secondary | ICD-10-CM | POA: Diagnosis not present

## 2019-01-11 DIAGNOSIS — S338XXA Sprain of other parts of lumbar spine and pelvis, initial encounter: Secondary | ICD-10-CM | POA: Diagnosis not present

## 2019-01-11 DIAGNOSIS — M9902 Segmental and somatic dysfunction of thoracic region: Secondary | ICD-10-CM | POA: Diagnosis not present

## 2019-01-11 DIAGNOSIS — M9903 Segmental and somatic dysfunction of lumbar region: Secondary | ICD-10-CM | POA: Diagnosis not present

## 2019-01-11 DIAGNOSIS — M546 Pain in thoracic spine: Secondary | ICD-10-CM | POA: Diagnosis not present

## 2019-01-11 DIAGNOSIS — M9901 Segmental and somatic dysfunction of cervical region: Secondary | ICD-10-CM | POA: Diagnosis not present

## 2019-01-11 DIAGNOSIS — G441 Vascular headache, not elsewhere classified: Secondary | ICD-10-CM | POA: Diagnosis not present

## 2019-01-14 DIAGNOSIS — G441 Vascular headache, not elsewhere classified: Secondary | ICD-10-CM | POA: Diagnosis not present

## 2019-01-14 DIAGNOSIS — M9903 Segmental and somatic dysfunction of lumbar region: Secondary | ICD-10-CM | POA: Diagnosis not present

## 2019-01-14 DIAGNOSIS — M9902 Segmental and somatic dysfunction of thoracic region: Secondary | ICD-10-CM | POA: Diagnosis not present

## 2019-01-14 DIAGNOSIS — M9901 Segmental and somatic dysfunction of cervical region: Secondary | ICD-10-CM | POA: Diagnosis not present

## 2019-01-14 DIAGNOSIS — M546 Pain in thoracic spine: Secondary | ICD-10-CM | POA: Diagnosis not present

## 2019-01-14 DIAGNOSIS — S338XXA Sprain of other parts of lumbar spine and pelvis, initial encounter: Secondary | ICD-10-CM | POA: Diagnosis not present

## 2019-01-18 DIAGNOSIS — M9902 Segmental and somatic dysfunction of thoracic region: Secondary | ICD-10-CM | POA: Diagnosis not present

## 2019-01-18 DIAGNOSIS — M9903 Segmental and somatic dysfunction of lumbar region: Secondary | ICD-10-CM | POA: Diagnosis not present

## 2019-01-18 DIAGNOSIS — M9901 Segmental and somatic dysfunction of cervical region: Secondary | ICD-10-CM | POA: Diagnosis not present

## 2019-01-18 DIAGNOSIS — G441 Vascular headache, not elsewhere classified: Secondary | ICD-10-CM | POA: Diagnosis not present

## 2019-01-18 DIAGNOSIS — S338XXA Sprain of other parts of lumbar spine and pelvis, initial encounter: Secondary | ICD-10-CM | POA: Diagnosis not present

## 2019-01-18 DIAGNOSIS — M546 Pain in thoracic spine: Secondary | ICD-10-CM | POA: Diagnosis not present

## 2019-01-20 DIAGNOSIS — S338XXA Sprain of other parts of lumbar spine and pelvis, initial encounter: Secondary | ICD-10-CM | POA: Diagnosis not present

## 2019-01-20 DIAGNOSIS — M9902 Segmental and somatic dysfunction of thoracic region: Secondary | ICD-10-CM | POA: Diagnosis not present

## 2019-01-20 DIAGNOSIS — M9903 Segmental and somatic dysfunction of lumbar region: Secondary | ICD-10-CM | POA: Diagnosis not present

## 2019-01-20 DIAGNOSIS — M546 Pain in thoracic spine: Secondary | ICD-10-CM | POA: Diagnosis not present

## 2019-01-20 DIAGNOSIS — G441 Vascular headache, not elsewhere classified: Secondary | ICD-10-CM | POA: Diagnosis not present

## 2019-01-20 DIAGNOSIS — M9901 Segmental and somatic dysfunction of cervical region: Secondary | ICD-10-CM | POA: Diagnosis not present

## 2019-01-26 DIAGNOSIS — M546 Pain in thoracic spine: Secondary | ICD-10-CM | POA: Diagnosis not present

## 2019-01-26 DIAGNOSIS — M9903 Segmental and somatic dysfunction of lumbar region: Secondary | ICD-10-CM | POA: Diagnosis not present

## 2019-01-26 DIAGNOSIS — S338XXA Sprain of other parts of lumbar spine and pelvis, initial encounter: Secondary | ICD-10-CM | POA: Diagnosis not present

## 2019-01-26 DIAGNOSIS — M9902 Segmental and somatic dysfunction of thoracic region: Secondary | ICD-10-CM | POA: Diagnosis not present

## 2019-01-26 DIAGNOSIS — M9901 Segmental and somatic dysfunction of cervical region: Secondary | ICD-10-CM | POA: Diagnosis not present

## 2019-01-26 DIAGNOSIS — G441 Vascular headache, not elsewhere classified: Secondary | ICD-10-CM | POA: Diagnosis not present

## 2019-02-02 DIAGNOSIS — M9902 Segmental and somatic dysfunction of thoracic region: Secondary | ICD-10-CM | POA: Diagnosis not present

## 2019-02-02 DIAGNOSIS — G441 Vascular headache, not elsewhere classified: Secondary | ICD-10-CM | POA: Diagnosis not present

## 2019-02-02 DIAGNOSIS — S338XXA Sprain of other parts of lumbar spine and pelvis, initial encounter: Secondary | ICD-10-CM | POA: Diagnosis not present

## 2019-02-02 DIAGNOSIS — M9903 Segmental and somatic dysfunction of lumbar region: Secondary | ICD-10-CM | POA: Diagnosis not present

## 2019-02-02 DIAGNOSIS — M546 Pain in thoracic spine: Secondary | ICD-10-CM | POA: Diagnosis not present

## 2019-02-02 DIAGNOSIS — M9901 Segmental and somatic dysfunction of cervical region: Secondary | ICD-10-CM | POA: Diagnosis not present

## 2019-02-09 DIAGNOSIS — M9901 Segmental and somatic dysfunction of cervical region: Secondary | ICD-10-CM | POA: Diagnosis not present

## 2019-02-09 DIAGNOSIS — S338XXA Sprain of other parts of lumbar spine and pelvis, initial encounter: Secondary | ICD-10-CM | POA: Diagnosis not present

## 2019-02-09 DIAGNOSIS — M9903 Segmental and somatic dysfunction of lumbar region: Secondary | ICD-10-CM | POA: Diagnosis not present

## 2019-02-09 DIAGNOSIS — G441 Vascular headache, not elsewhere classified: Secondary | ICD-10-CM | POA: Diagnosis not present

## 2019-02-09 DIAGNOSIS — M9902 Segmental and somatic dysfunction of thoracic region: Secondary | ICD-10-CM | POA: Diagnosis not present

## 2019-02-09 DIAGNOSIS — M546 Pain in thoracic spine: Secondary | ICD-10-CM | POA: Diagnosis not present

## 2019-02-23 ENCOUNTER — Ambulatory Visit (INDEPENDENT_AMBULATORY_CARE_PROVIDER_SITE_OTHER): Payer: Medicare Other | Admitting: Internal Medicine

## 2019-02-23 DIAGNOSIS — M9902 Segmental and somatic dysfunction of thoracic region: Secondary | ICD-10-CM | POA: Diagnosis not present

## 2019-02-23 DIAGNOSIS — S338XXA Sprain of other parts of lumbar spine and pelvis, initial encounter: Secondary | ICD-10-CM | POA: Diagnosis not present

## 2019-02-23 DIAGNOSIS — M9901 Segmental and somatic dysfunction of cervical region: Secondary | ICD-10-CM | POA: Diagnosis not present

## 2019-02-23 DIAGNOSIS — E559 Vitamin D deficiency, unspecified: Secondary | ICD-10-CM | POA: Diagnosis not present

## 2019-02-23 DIAGNOSIS — G441 Vascular headache, not elsewhere classified: Secondary | ICD-10-CM | POA: Diagnosis not present

## 2019-02-23 DIAGNOSIS — M130 Polyarthritis, unspecified: Secondary | ICD-10-CM | POA: Diagnosis not present

## 2019-02-23 DIAGNOSIS — I10 Essential (primary) hypertension: Secondary | ICD-10-CM | POA: Diagnosis not present

## 2019-02-23 DIAGNOSIS — M546 Pain in thoracic spine: Secondary | ICD-10-CM | POA: Diagnosis not present

## 2019-02-23 DIAGNOSIS — M9903 Segmental and somatic dysfunction of lumbar region: Secondary | ICD-10-CM | POA: Diagnosis not present

## 2019-03-09 ENCOUNTER — Telehealth: Payer: Self-pay | Admitting: Cardiology

## 2019-03-09 DIAGNOSIS — M9901 Segmental and somatic dysfunction of cervical region: Secondary | ICD-10-CM | POA: Diagnosis not present

## 2019-03-09 DIAGNOSIS — M546 Pain in thoracic spine: Secondary | ICD-10-CM | POA: Diagnosis not present

## 2019-03-09 DIAGNOSIS — M9902 Segmental and somatic dysfunction of thoracic region: Secondary | ICD-10-CM | POA: Diagnosis not present

## 2019-03-09 DIAGNOSIS — G441 Vascular headache, not elsewhere classified: Secondary | ICD-10-CM | POA: Diagnosis not present

## 2019-03-09 DIAGNOSIS — S338XXA Sprain of other parts of lumbar spine and pelvis, initial encounter: Secondary | ICD-10-CM | POA: Diagnosis not present

## 2019-03-09 DIAGNOSIS — M9903 Segmental and somatic dysfunction of lumbar region: Secondary | ICD-10-CM | POA: Diagnosis not present

## 2019-03-09 NOTE — Telephone Encounter (Signed)
Virtual Visit Pre-Appointment Phone Call  "(Name), I am calling you today to discuss your upcoming appointment. We are currently trying to limit exposure to the virus that causes COVID-19 by seeing patients at home rather than in the office."  1. "What is the BEST phone number to call the day of the visit?" - include this in appointment notes  2. Do you have or have access to (through a family member/friend) a smartphone with video capability that we can use for your visit?" a. If yes - list this number in appt notes as cell (if different from BEST phone #) and list the appointment type as a VIDEO visit in appointment notes b. If no - list the appointment type as a PHONE visit in appointment notes  3. Confirm consent - "In the setting of the current Covid19 crisis, you are scheduled for a (phone or video) visit with your provider on (date) at (time).  Just as we do with many in-office visits, in order for you to participate in this visit, we must obtain consent.  If you'd like, I can send this to your mychart (if signed up) or email for you to review.  Otherwise, I can obtain your verbal consent now.  All virtual visits are billed to your insurance company just like a normal visit would be.  By agreeing to a virtual visit, we'd like you to understand that the technology does not allow for your provider to perform an examination, and thus may limit your provider's ability to fully assess your condition. If your provider identifies any concerns that need to be evaluated in person, we will make arrangements to do so.  Finally, though the technology is pretty good, we cannot assure that it will always work on either your or our end, and in the setting of a video visit, we may have to convert it to a phone-only visit.  In either situation, we cannot ensure that we have a secure connection.  Are you willing to proceed?" STAFF: Did the patient verbally acknowledge consent to telehealth visit? Document  YES/NO here: Yes  4. Advise patient to be prepared - "Two hours prior to your appointment, go ahead and check your blood pressure, pulse, oxygen saturation, and your weight (if you have the equipment to check those) and write them all down. When your visit starts, your provider will ask you for this information. If you have an Apple Watch or Kardia device, please plan to have heart rate information ready on the day of your appointment. Please have a pen and paper handy nearby the day of the visit as well."  5. Give patient instructions for MyChart download to smartphone OR [email or web address] as below if video visit (depending on what platform provider is using)  6. Inform patient they will receive a phone call 15 minutes prior to their appointment time (may be from unknown caller ID) so they should be prepared to answer    TELEPHONE CALL NOTE  Rhonda Andrews has been deemed a candidate for a follow-up tele-health visit to limit community exposure during the Covid-19 pandemic. I spoke with the patient via phone to ensure availability of phone/video source, confirm preferred email & phone number, and discuss instructions and expectations.  I reminded Rhonda Andrews to be prepared with any vital sign and/or heart rhythm information that could potentially be obtained via home monitoring, at the time of her visit. I reminded Rhonda Andrews to expect a phone call prior to  her visit.  Bertram Gala Goins 03/09/2019 1:38 PM

## 2019-03-15 ENCOUNTER — Encounter: Payer: Self-pay | Admitting: Cardiology

## 2019-03-15 ENCOUNTER — Telehealth (INDEPENDENT_AMBULATORY_CARE_PROVIDER_SITE_OTHER): Payer: Medicare Other | Admitting: Cardiology

## 2019-03-15 VITALS — BP 126/78 | HR 80 | Ht 64.0 in | Wt 138.0 lb

## 2019-03-15 DIAGNOSIS — R002 Palpitations: Secondary | ICD-10-CM

## 2019-03-15 DIAGNOSIS — R42 Dizziness and giddiness: Secondary | ICD-10-CM

## 2019-03-15 NOTE — Progress Notes (Signed)
Virtual Visit via Video Note   This visit type was conducted due to national recommendations for restrictions regarding the COVID-19 Pandemic (e.g. social distancing) in an effort to limit this patient's exposure and mitigate transmission in our community.  Due to her co-morbid illnesses, this patient is at least at moderate risk for complications without adequate follow up.  This format is felt to be most appropriate for this patient at this time.  All issues noted in this document were discussed and addressed.  A limited physical exam was performed with this format.  Please refer to the patient's chart for her consent to telehealth for [facility].   Date:  03/15/2019   ID:  Rhonda Andrews, DOB 08-29-1941, MRN [id number]  Patient Location: Home Provider Location: Home  PCP:  Doree Albee, MD  Cardiologist:  Carlyle Dolly, MD  Electrophysiologist:  None   Evaluation Performed:  Follow-Up Visit  Chief Complaint:  Follow up  History of Present Illness:    Rhonda Andrews is a 78 y.o. female seen today for follow up of the following medical problems.   1. Palpitations/Dizziness - previous holter 05/2009 showed no significant arrhythmias.  - duringprioradmission did haveshort episode of PSVT - she was changed from propanolol to atenolol. With change had increase in symptoms and she changed herself back to propanolol - she reports short acting propanolol does not work as well, prefers long acting. - diltiazemcausedlow bp'sat home per her report, she stopped taking.  - tried midodine x 2 weeks, SBP 130s. Reports insomnia and she stopped taking. We had tried to see if could stabilizer her bp to allow more stable use of her beta blocker  - no recent palpitations or dizziness - has not taken propanolol recently. Will take just prn.     The patient does not have symptoms concerning for COVID-19 infection (fever, chills, cough, or new shortness of breath).     Past Medical History:  Diagnosis Date  . Anxiety   . 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 Clostridium difficile diarrhea 02/05/2015  . Dizziness   . 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 Hypertension   . IBS (irritable bowel syndrome)   . Lactose intolerance   . Palpitation   . Palpitations    Past Surgical History:  Procedure Laterality Date  . ABDOMINAL HYSTERECTOMY     ovaries remained  . CHOLECYSTECTOMY    . COLONOSCOPY  2010  . COLONOSCOPY WITH PROPOFOL N/A 05/21/2018   Procedure: COLONOSCOPY WITH PROPOFOL;  Surgeon: Gatha Mayer, MD;  Location: [facility];  Service: Endoscopy;  Laterality: N/A;  with FMT  . 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 KNEE ARTHROSCOPY Right   . Precancerous  Tissue - Nose  February 14-2014     Current Meds  Medication Sig  . Calcium Carb-Cholecalciferol (CALCIUM + VITAMIN D3 PO) Take 1 tablet by mouth 2 (two) times daily.  . Cholecalciferol (VITAMIN D) 2000 units tablet Take 2,000 Units by mouth daily.   . ondansetron (ZOFRAN) 4 MG tablet Take 1 tablet (4 mg total) by mouth every 8 (eight) hours as needed for nausea or vomiting.     Allergies:   Hydrocodone and Prednisone   Social History   Tobacco Use  . Smoking status: Never Smoker  . Smokeless tobacco: Never Used  Substance Use Topics  . Alcohol use: No    Alcohol/week: 0.0  standard drinks  . Drug use: No     Family Hx: The patient's family history includes Colon cancer in her mother; Lymphoma in her mother.  ROS:   Please see the history of present illness.     All other systems reviewed and are negative.   Prior CV studies:   The following studies were reviewed today:   Labs/Other Tests and Data Reviewed:    EKG:  No ECG reviewed.  Recent Labs: No results found for requested labs within last 8760 hours.   Recent Lipid Panel No results found for: CHOL, TRIG,  HDL, CHOLHDL, LDLCALC, LDLDIRECT  Wt Readings from Last 3 Encounters:  03/15/19 138 lb (62.6 kg)  09/10/18 139 lb (63 kg)  07/20/18 136 lb (61.7 kg)     Objective:    Vital Signs:  BP 126/78   Pulse 80   Ht 5\' 4"  (1.626 m)   Wt 138 lb (62.6 kg)   BMI 23.69 kg/m    Well nourished female sitting comfortably in no distress. Normal affect. Normal speech pattern and tone. No audible signs of SOB or wheezing.   ASSESSMENT & PLAN:    1. Palpitations/Dizziness -no recent symptoms, has not required her propanolol in quite some time - continue to monitor at this time.    COVID-19 Education: The signs and symptoms of COVID-19 were discussed with the patient and how to seek care for testing (follow up with PCP or arrange E-visit).  The importance of social distancing was discussed today.  Time:   Today, I have spent 8 minutes with the patient with telehealth technology discussing the above problems.     Medication Adjustments/Labs and Tests Ordered: Current medicines are reviewed at length with the patient today.  Concerns regarding medicines are outlined above.   Tests Ordered: No orders of the defined types were placed in this encounter.   Medication Changes: No orders of the defined types were placed in this encounter.   Follow Up:  In Person in 6 month(s)  Signed, Carlyle Dolly, MD  03/15/2019 9:18 AM    [place]

## 2019-03-15 NOTE — Patient Instructions (Signed)
Medication Instructions: Your physician recommends that you continue on your current medications as directed. Please refer to the Current Medication list given to you today.   Labwork: None  Procedures/Testing: None  Follow-Up: 6 months with Dr.Branch  Any Additional Special Instructions Will Be Listed Below (If Applicable).     If you need a refill on your cardiac medications before your next appointment, please call your pharmacy.     Thank you for choosing [facility] !

## 2019-03-23 DIAGNOSIS — M546 Pain in thoracic spine: Secondary | ICD-10-CM | POA: Diagnosis not present

## 2019-03-23 DIAGNOSIS — M9901 Segmental and somatic dysfunction of cervical region: Secondary | ICD-10-CM | POA: Diagnosis not present

## 2019-03-23 DIAGNOSIS — S338XXA Sprain of other parts of lumbar spine and pelvis, initial encounter: Secondary | ICD-10-CM | POA: Diagnosis not present

## 2019-03-23 DIAGNOSIS — G441 Vascular headache, not elsewhere classified: Secondary | ICD-10-CM | POA: Diagnosis not present

## 2019-03-23 DIAGNOSIS — M9903 Segmental and somatic dysfunction of lumbar region: Secondary | ICD-10-CM | POA: Diagnosis not present

## 2019-03-23 DIAGNOSIS — M9902 Segmental and somatic dysfunction of thoracic region: Secondary | ICD-10-CM | POA: Diagnosis not present

## 2019-04-04 DIAGNOSIS — L821 Other seborrheic keratosis: Secondary | ICD-10-CM | POA: Diagnosis not present

## 2019-04-04 DIAGNOSIS — Z85828 Personal history of other malignant neoplasm of skin: Secondary | ICD-10-CM | POA: Diagnosis not present

## 2019-04-04 DIAGNOSIS — L570 Actinic keratosis: Secondary | ICD-10-CM | POA: Diagnosis not present

## 2019-04-20 DIAGNOSIS — M9901 Segmental and somatic dysfunction of cervical region: Secondary | ICD-10-CM | POA: Diagnosis not present

## 2019-04-20 DIAGNOSIS — M9903 Segmental and somatic dysfunction of lumbar region: Secondary | ICD-10-CM | POA: Diagnosis not present

## 2019-04-20 DIAGNOSIS — G441 Vascular headache, not elsewhere classified: Secondary | ICD-10-CM | POA: Diagnosis not present

## 2019-04-20 DIAGNOSIS — M546 Pain in thoracic spine: Secondary | ICD-10-CM | POA: Diagnosis not present

## 2019-04-20 DIAGNOSIS — M9902 Segmental and somatic dysfunction of thoracic region: Secondary | ICD-10-CM | POA: Diagnosis not present

## 2019-04-20 DIAGNOSIS — S338XXA Sprain of other parts of lumbar spine and pelvis, initial encounter: Secondary | ICD-10-CM | POA: Diagnosis not present

## 2019-05-30 IMAGING — CT CT HEAD W/O CM
3 series · 16 of 47 positions shown, 19 images · non-contrast
Comparison: None

CLINICAL DATA: Elevated blood pressure, did not feel RIGHT,
suspected subarachnoid hemorrhage

EXAM:
CT HEAD WITHOUT CONTRAST
TECHNIQUE: Contiguous axial images were obtained from the base of the skull
through the vertex without intravenous contrast. Sagittal and
coronal MPR images reconstructed from axial data set.

[Series 2: head wo · axial · 0.42mm/px · z∈[+1567,+1692]mm · 10 of 30 slices shown, 13 images]
[im 3/30  brain]
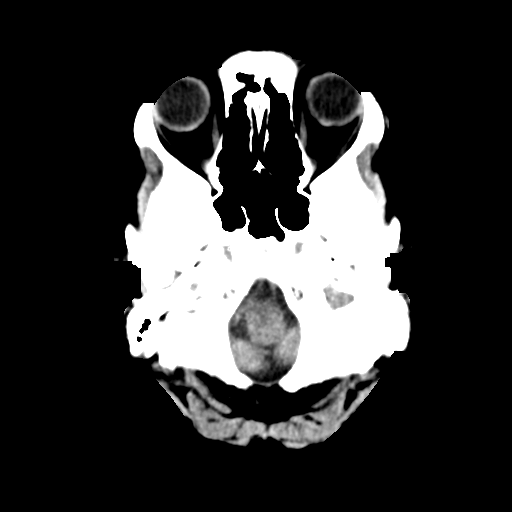
[im 3/30  bone]
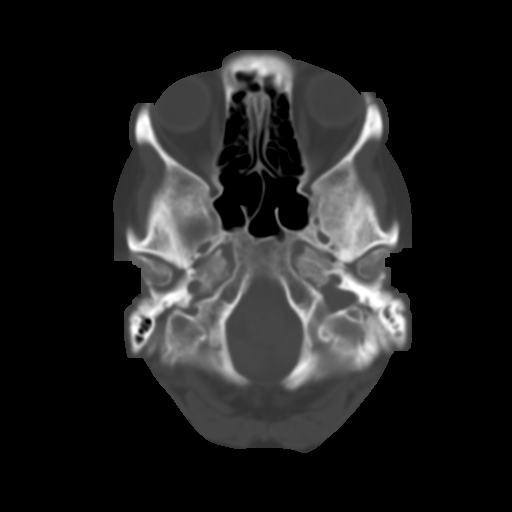
[im 6/30  brain]
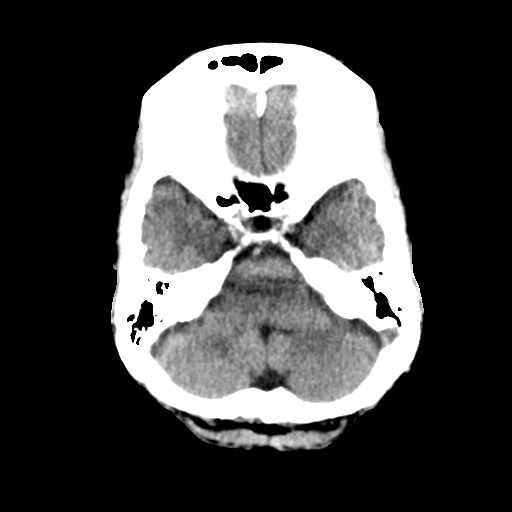
[im 9/30  brain]
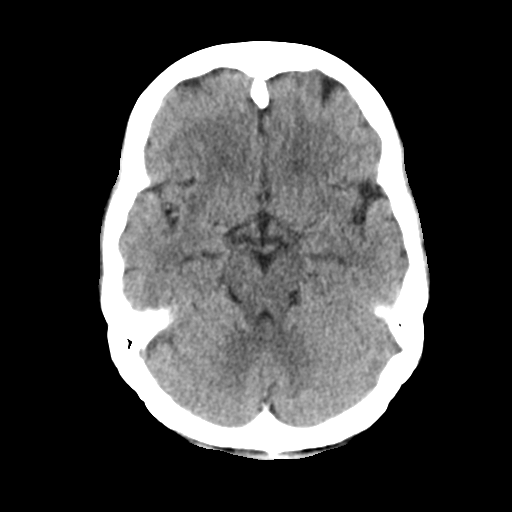
[im 11/30  brain]
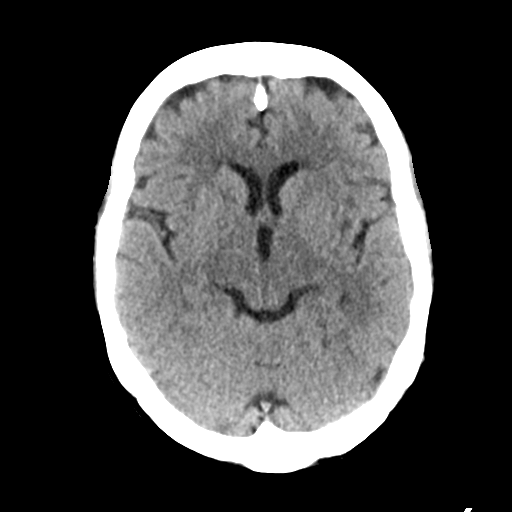
[im 14/30  brain]
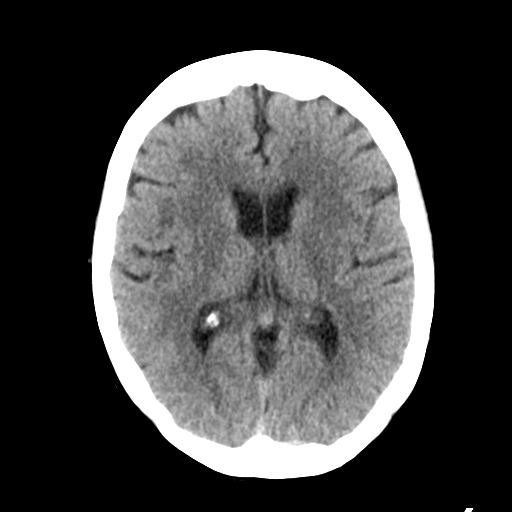
[im 14/30  bone]
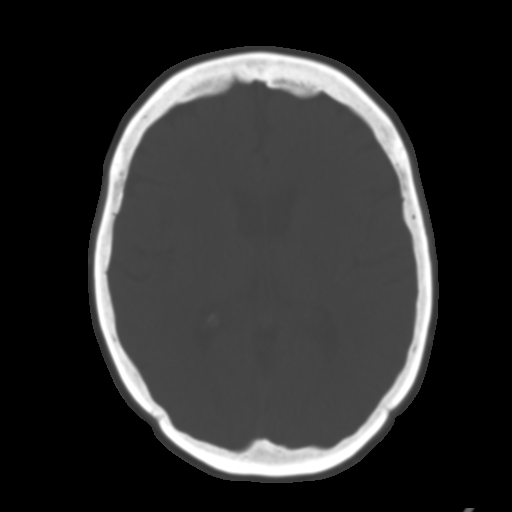
[im 17/30  brain]
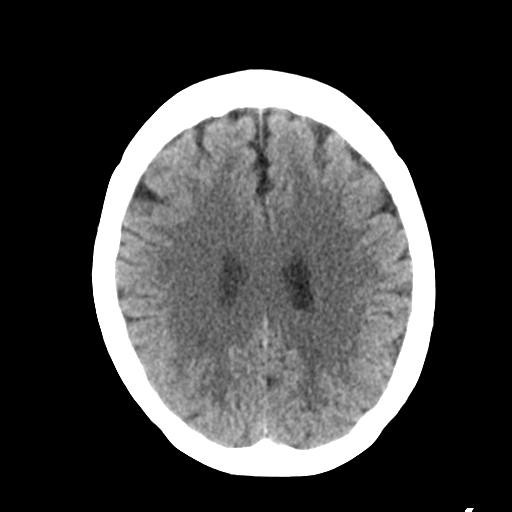
[im 20/30  brain]
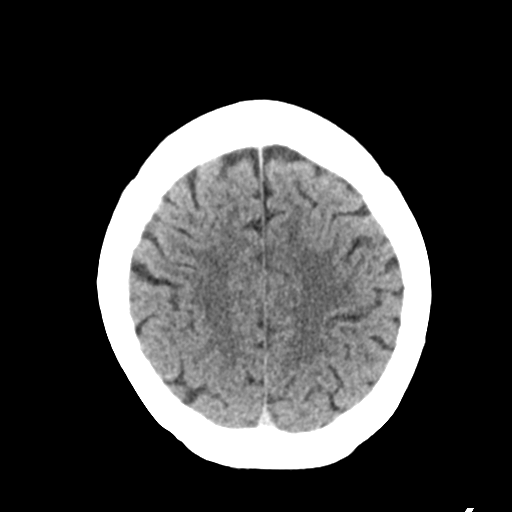
[im 23/30  brain]
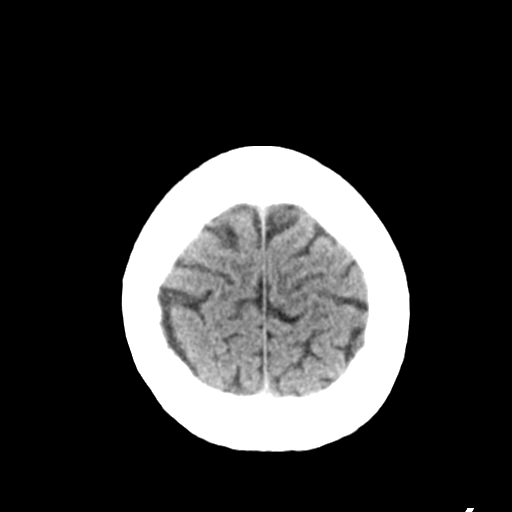
[im 25/30  brain]
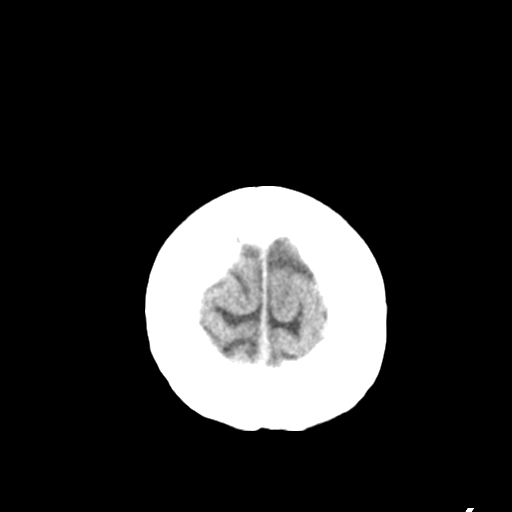
[im 25/30  bone]
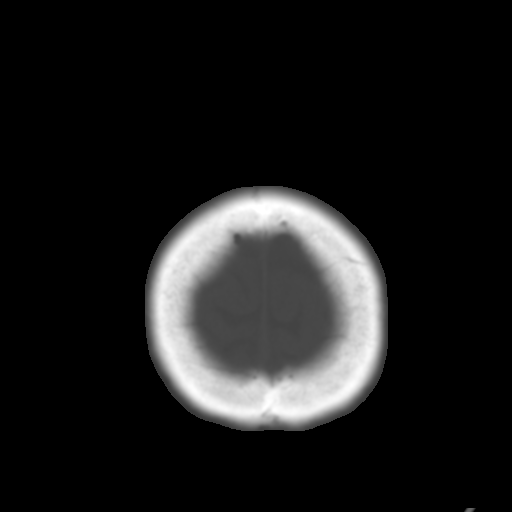
[im 28/30  brain]
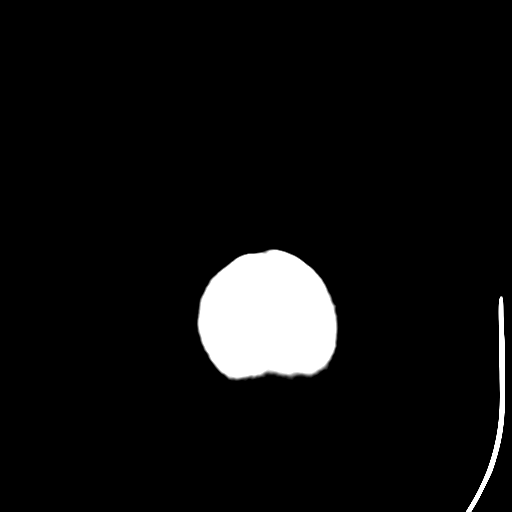

[Series 4: coronal soft tissue · coronal · 0.30mm/px · 3 of 67 slices shown]
[im 23/67  brain]
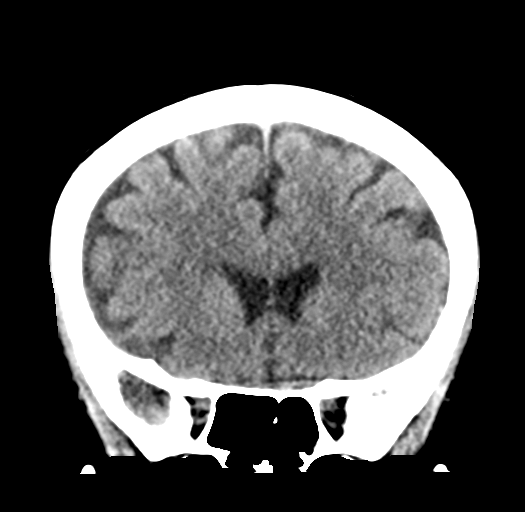
[im 30/67  brain]
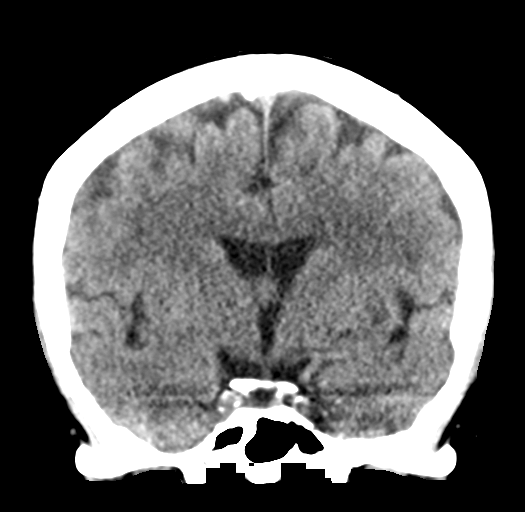
[im 37/67  brain]
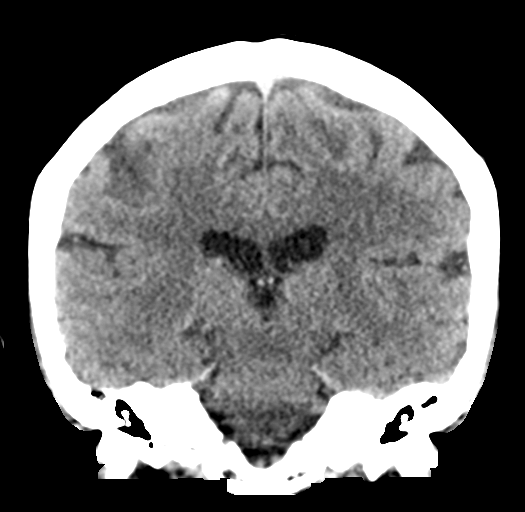

[Series 5: sagittal soft tissue · sagittal · 0.31mm/px · 3 of 53 slices shown]
[im 18/53  brain]
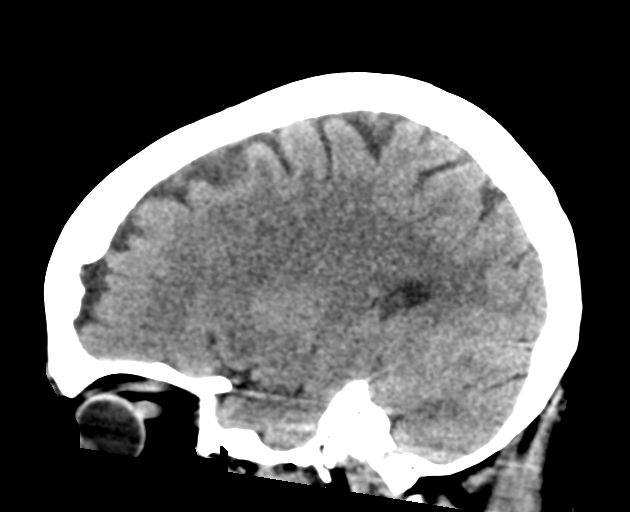
[im 27/53  brain]
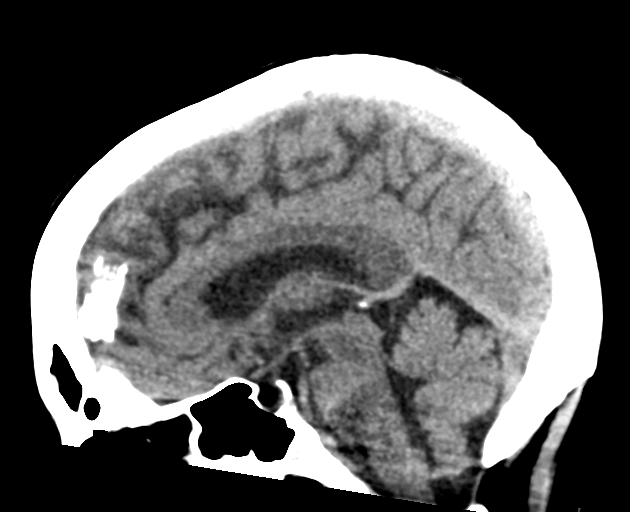
[im 35/53  brain]
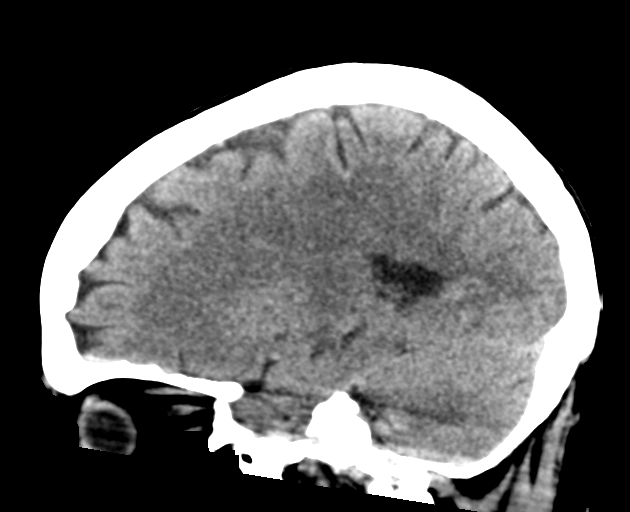

[16 of 47 positions shown; findings below may reference images not displayed]

FINDINGS: Brain: Beam hardening artifacts from skullbase traversed pons.
Generalized age-related atrophy. Normal ventricular morphology. No
midline shift or mass effect. Small vessel chronic ischemic changes
of deep cerebral white matter. No intracranial hemorrhage, mass
lesion, evidence of acute infarction, or extra-axial fluid
collection. Ossification within anterior falx.

Vascular: Normal appearance

Skull: Intact

Sinuses/Orbits: Clear

Other: N/A
IMPRESSION: Age-related atrophy with mild small vessel chronic ischemic changes
of deep cerebral white matter.

No acute intracranial abnormalities.

## 2019-06-14 ENCOUNTER — Encounter (INDEPENDENT_AMBULATORY_CARE_PROVIDER_SITE_OTHER): Payer: Self-pay | Admitting: Internal Medicine

## 2019-06-14 ENCOUNTER — Other Ambulatory Visit: Payer: Self-pay

## 2019-06-14 ENCOUNTER — Ambulatory Visit (INDEPENDENT_AMBULATORY_CARE_PROVIDER_SITE_OTHER): Payer: Medicare Other | Admitting: Internal Medicine

## 2019-06-14 VITALS — BP 149/80 | HR 86 | Temp 97.5°F | Ht 64.0 in | Wt 138.1 lb

## 2019-06-14 DIAGNOSIS — A0472 Enterocolitis due to Clostridium difficile, not specified as recurrent: Secondary | ICD-10-CM | POA: Diagnosis not present

## 2019-06-14 NOTE — Patient Instructions (Signed)
Notify if diarrhea recurs. Can use OTC antacid or Pepcid OTC  20 mg on as needed basis.

## 2019-06-14 NOTE — Progress Notes (Signed)
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  Database and subjective:  Patient is 78 year old Caucasian female who has history of IBS who developed C. difficile colitis which could not be eradicated despite multiple courses of vancomycin.  She finally underwent FMT by Dr. Michelene Heady 1 year ago.  She did not have any side effects from this therapy. Patient says within few days of said therapy her bowels returned to normal.  She has had no problems whatsoever.  She does not even have urgency or nocturnal bowel movement that she is to have.  She is still having some difficulty with solids as she gets cramping.  She says she has had the symptoms since she had gallbladder removed.  Her appetite is good.  Patient states she eats very healthy.  Every now and then she has craving for fries and she may use ketchup and then she has heartburn.  She wonders what she can take for heartburn which is not very often.  She has gained 3 pounds since her last visit. She says she had blood work by Dr. Anastasio Champion recently and it was all normal. She walks 2 miles every day.  Current Medications: Outpatient Encounter Medications as of 06/14/2019  Medication Sig  . Calcium Carb-Cholecalciferol (CALCIUM + VITAMIN D3 PO) Take 1 tablet by mouth 2 (two) times daily.  . Cholecalciferol (VITAMIN D) 2000 units tablet Take 2,000 Units by mouth daily.   . ondansetron (ZOFRAN) 4 MG tablet Take 1 tablet (4 mg total) by mouth every 8 (eight) hours as needed for nausea or vomiting.   No facility-administered encounter medications on file as of 06/14/2019.      Objective: Blood pressure (!) 149/80, pulse 86, temperature (!) 97.5 F (36.4 C), temperature source Oral, height 5\' 4"  (1.626 m), weight 138 lb 1.6 oz (62.6 kg). Patient is alert and in no acute distress. Conjunctiva is pink. Sclera is nonicteric Oropharyngeal mucosa is normal. No neck masses or thyromegaly  noted. Cardiac exam with regular rhythm normal S1 and S2. No murmur or gallop noted. Lungs are clear to auscultation. Abdomen is symmetrical soft and nontender with organomegaly or masses. No LE edema or clubbing noted.   Assessment:  #1.  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 Her IBS symptoms have also resolved.  She is not having any side effects with this therapy.   Plan:  Patient can use OTC antacids on as-needed basis for heartburn which is usually triggered with ketchup etc. Patient will return for office visit in 1 year unless she has diarrhea.

## 2019-06-23 ENCOUNTER — Other Ambulatory Visit: Payer: Self-pay

## 2019-06-23 ENCOUNTER — Ambulatory Visit (INDEPENDENT_AMBULATORY_CARE_PROVIDER_SITE_OTHER): Payer: Medicare Other | Admitting: Internal Medicine

## 2019-06-23 ENCOUNTER — Encounter (INDEPENDENT_AMBULATORY_CARE_PROVIDER_SITE_OTHER): Payer: Self-pay | Admitting: Internal Medicine

## 2019-06-23 VITALS — BP 126/79 | HR 79 | Temp 96.5°F | Resp 18 | Ht 64.0 in | Wt 138.0 lb

## 2019-06-23 DIAGNOSIS — I10 Essential (primary) hypertension: Secondary | ICD-10-CM

## 2019-06-23 DIAGNOSIS — E559 Vitamin D deficiency, unspecified: Secondary | ICD-10-CM

## 2019-06-23 HISTORY — DX: Vitamin D deficiency, unspecified: E55.9

## 2019-06-23 NOTE — Progress Notes (Signed)
Wellness Office Visit  Subjective:  Patient ID: Rhonda Andrews, female    DOB: 1940-10-04  Age: 78 y.o. MRN: [id number]  CC: This lady comes in for follow-up of vitamin D deficiency. HPI  She is doing very well.  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 difficile colitis seems to have not been a recurrent problem anymore. On the last visit, I asked her to increase the vitamin D3 to a dose of about 5000 units daily which she has done. Also on the last visit, I was concerned about hypertension.  I did not start any medications but wanted to follow-up with her today. Past Medical History:  Diagnosis Date  . Anxiety   . 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 Clostridium difficile diarrhea 02/05/2015  . Dizziness   . 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 Hypertension   . IBS (irritable bowel syndrome)   . Lactose intolerance   . Palpitation   . Palpitations   . Vitamin D deficiency disease 06/23/2019      Family History  Problem Relation Age of Onset  . Lymphoma Mother   . Colon cancer Mother     Social History   Social History Narrative   Married. Has a daughter. 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   Eats all food groups and has a great appetite.    Walks in house daily in her house, 1.5 miles a day.   Right handed   Quit drinking caffeine 15 years ago   No EtOH/tobacco     Current Meds  Medication Sig  . Calcium Carb-Cholecalciferol (CALCIUM + VITAMIN D3 PO) Take 1 tablet by mouth 2 (two) times daily.  . Cholecalciferol (VITAMIN D) 2000 units tablet Take 4,000 Units by mouth daily.   . vitamin B-12 (CYANOCOBALAMIN) 100 MCG tablet Take 100 mcg by mouth daily.     Objective:   Today's Vitals: BP 126/79 (BP Location: Right Arm, Patient Position: Sitting, Cuff Size: Normal)   Pulse 79   Temp (!) 96.5 F (35.8 C) (Temporal)   Resp 18   Ht 5\' 4"  (1.626 m)   Wt 138 lb (62.6 kg)   SpO2 98% Comment:  room air w/mask.  BMI 23.69 kg/m  Vitals with BMI 06/23/2019 06/14/2019 03/15/2019  Height 5\' 4"  5\' 4"  5\' 4"   Weight 138 lbs 138 lbs 2 oz 138 lbs  BMI 23.68 [id number]  Systolic [id number]  Diastolic 79 80 78  Pulse 79 86 80     Physical Exam   She look systemically well and her blood pressure is normal today.  She is alert and orientated without any obvious focal neurological signs.    Assessment   1. Essential hypertension   2. Vitamin D deficiency disease       Tests ordered Orders Placed This Encounter  Procedures  . COMPLETE METABOLIC PANEL WITH GFR  . VITAMIN D 25 Hydroxy (Vit-D Deficiency, Fractures)     Plan: 1. Her hypertension is not really a problem and she had one reading that was elevated so I am not sure I would describe it as hypertension but we will keep this under close monitor. 2. Blood work is done for vitamin D levels and she will continue with vitamin D3 supplementation. 3. I offered influenza vaccination but she declined. 4. I will set her up for an annual Medicare wellness visit with Judson Roch in the next couple of months.  No orders of the defined types were placed in this encounter.   Doree Albee, MD

## 2019-06-24 LAB — COMPLETE METABOLIC PANEL WITH GFR
AG Ratio: 1.7 (calc) (ref 1.0–2.5)
ALT: 15 U/L (ref 6–29)
AST: 17 U/L (ref 10–35)
Albumin: 4.1 g/dL (ref 3.6–5.1)
Alkaline phosphatase (APISO): 77 U/L (ref 37–153)
BUN: 23 mg/dL (ref 7–25)
CO2: 29 mmol/L (ref 20–32)
Calcium: 9.7 mg/dL (ref 8.6–10.4)
Chloride: 104 mmol/L (ref 98–110)
Creat: 0.85 mg/dL (ref 0.60–0.93)
GFR, Est African American: 77 mL/min/{1.73_m2} (ref >=60)
GFR, Est Non African American: 67 mL/min/{1.73_m2} (ref >=60)
Globulin: 2.4 g/dL (calc) (ref 1.9–3.7)
Glucose, Bld: 81 mg/dL (ref 65–99)
Potassium: 4.5 mmol/L (ref 3.5–5.3)
Sodium: 142 mmol/L (ref 135–146)
Total Bilirubin: 0.5 mg/dL (ref 0.2–1.2)
Total Protein: 6.5 g/dL (ref 6.1–8.1)

## 2019-06-24 LAB — VITAMIN D 25 HYDROXY (VIT D DEFICIENCY, FRACTURES): Vit D, 25-Hydroxy: 46 ng/mL (ref 30–100)

## 2019-09-05 ENCOUNTER — Encounter (INDEPENDENT_AMBULATORY_CARE_PROVIDER_SITE_OTHER): Payer: Self-pay | Admitting: Nurse Practitioner

## 2019-09-05 ENCOUNTER — Ambulatory Visit (INDEPENDENT_AMBULATORY_CARE_PROVIDER_SITE_OTHER): Payer: Medicare Other | Admitting: Nurse Practitioner

## 2019-09-05 VITALS — BP 122/78 | HR 80 | Temp 96.7°F | Ht 64.0 in | Wt 138.0 lb

## 2019-09-05 DIAGNOSIS — Z0000 Encounter for general adult medical examination without abnormal findings: Secondary | ICD-10-CM | POA: Diagnosis not present

## 2019-09-05 NOTE — Patient Instructions (Signed)
Thank you for choosing [facility] as your medical provider! If you have any questions or concerns regarding your health care, please do not hesitate to call our office.  Immunizations: You are due for flu shot.  You are most likely due for tetanus and shingles vaccines.  When you determine which pneumonia vaccine you had and when you had this done, you may also be due for that as well.  If you would like to undergo any vaccinations, please let our office know.  Health maintenance/screenings: If you would like to have sexual transmitted infection or hepatitis C screening please let this office know.  When you are ready to undergo bone density scan to screen for osteoporosis please let us know.  Please follow-up as scheduled in 3 months. We look forward to seeing you again soon! Have a great Valentine's Day!!  At [facility] we value your feedback. You may receive a survey about your visit today. Please share your experience as we strive to create trusting relationships with our patients to provide genuine, compassionate, quality care.  We appreciate your understanding and patience as we review any laboratory studies, imaging, and other diagnostic tests that are ordered as we care for you. We do our best to address any and all results in a timely manner. If you do not hear about test results within 1 week, please do not hesitate to contact us. If we referred you to a specialist during your visit or ordered imaging testing, contact the office if you have not been contacted to be scheduled within 1 weeks.  We also encourage the use of MyChart, which contains your medical information for your review as well. If you are not enrolled in this feature, an access code is on this after visit summary for your convenience. Thank you for allowing [place] to be involved in your care.  '

## 2019-09-05 NOTE — Progress Notes (Signed)
Due to national recommendations of social distancing related to the [place] pandemic, an audio/visual tele-health visit was felt to be the most appropriate encounter type for this patient today. I connected with  Rhonda Andrews on 09/05/19 utilizing audio-only technology and verified that I am speaking with the correct person using two identifiers. The patient was located at their home, and I was located at the office of [facility] during the encounter. I discussed the limitations of evaluation and management by telemedicine.  The patient did not have technology available to her to allow [place] to do the visit with video.  Thus audio only was used.  The patient expressed understanding and agreed to proceed.       Subjective:   Rhonda Andrews is a 79 y.o. female who presents for Medicare Annual (Subsequent) preventive examination.  Review of Systems:  Cardiac Risk Factors include: advanced age (>31men, >25 women)     Objective:     Vitals: BP 122/78   Pulse 80   Temp (!) 96.7 F (35.9 C)   Ht 5\' 4"  (1.626 m)   Wt 138 lb (62.6 kg)   BMI 23.69 kg/m   Body mass index is 23.69 kg/m.  Advanced Directives 09/05/2019 05/21/2018 05/19/2018 10/07/2017 08/11/2017 06/15/2017 03/31/2015  Does Patient Have a Medical Advance Directive? Yes Yes Yes Yes Yes Yes No  Type of Paramedic of [place];Out of facility DNR (pink MOST or yellow form) [place];Living will [place];Living will [place];Living will [place];Living will [place];Living will -  Does patient want to make changes to medical advance directive? No - Patient declined - No - Patient declined - No - Patient declined No - Patient declined -  Copy of [place] in Chart? Yes - validated most recent copy scanned in chart (See row information) No - copy requested No - copy requested - No -  copy requested No - copy requested -  Would patient like information on creating a medical advance directive? - - - - No - Patient declined - No - patient declined information    Tobacco Social History   Tobacco Use  Smoking Status Never Smoker  Smokeless Tobacco Never Used     Counseling given: Not Answered   Clinical Intake:  Pre-visit preparation completed: Yes  Pain : No/denies pain     Nutritional Status: BMI of 19-24  Normal Diabetes: No  How often do you need to have someone help you when you read instructions, pamphlets, or other written materials from your doctor or pharmacy?: 1 - Never What is the last grade level you completed in school?: some college  Interpreter Needed?: No  Information entered by :: Jeralyn Ruths, Np-C  Past Medical History:  Diagnosis Date  . Anxiety   . 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 Clostridium difficile diarrhea 02/05/2015  . Diabetes mellitus without complication (HCC)    prediabetes  . Dizziness   . 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 Hypertension   . IBS (irritable bowel syndrome)   . Lactose intolerance   . Palpitation   . Palpitations   . Vitamin D deficiency disease 06/23/2019   Past Surgical History:  Procedure Laterality Date  . ABDOMINAL HYSTERECTOMY     ovaries remained  . CHOLECYSTECTOMY    . COLONOSCOPY  2010  . COLONOSCOPY WITH PROPOFOL N/A 05/21/2018   Procedure: COLONOSCOPY WITH PROPOFOL;  Surgeon: Carlean Purl,  Ofilia Neas, MD;  Location: [facility];  Service: Endoscopy;  Laterality: N/A;  with FMT  . 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 KNEE ARTHROSCOPY Right   . Precancerous  Tissue - Nose  February 14-2014   Family History  Problem Relation Age of Onset  . Lymphoma Mother   . Colon cancer Mother   . Hypertension Mother   . Migraines Mother   . Other Father        Wagner's Granulermatosis  . Seizures  Sister   . Cancer Maternal Grandmother        brain  . Cancer Maternal Grandfather        brain  . Other Paternal Grandmother        blood clot - DVT following surgery  . Alcohol abuse Paternal Grandfather   . Cirrhosis Paternal Grandfather   . Thyroid disease Sister   . Heart disease Sister    Social History   Socioeconomic History  . Marital status: Married    Spouse name: Not on file  . Number of children: 1  . Years of education: Not on file  . Highest education level: Some college, no degree  Occupational History  . Not on file  Tobacco Use  . Smoking status: Never Smoker  . Smokeless tobacco: Never Used  Substance and Sexual Activity  . Alcohol use: No    Alcohol/week: 0.0 standard drinks  . Drug use: No  . Sexual activity: Not on file  Other Topics Concern  . Not on file  Social History Narrative   Married. Has a daughter. 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   Eats all food groups and has a great appetite.    Walks in house daily in her house, 1.5 miles a day.   Right handed   Quit drinking caffeine 15 years ago   No EtOH/tobacco   Social Determinants of Health   Financial Resource Strain:   . Difficulty of Paying Living Expenses: Not on file  Food Insecurity:   . Worried About Charity fundraiser in the Last Year: Not on file  . Ran Out of Food in the Last Year: Not on file  Transportation Needs:   . Lack of Transportation (Medical): Not on file  . Lack of Transportation (Non-Medical): Not on file  Physical Activity:   . Days of Exercise per Week: Not on file  . Minutes of Exercise per Session: Not on file  Stress:   . Feeling of Stress : Not on file  Social Connections:   . Frequency of Communication with Friends and Family: Not on file  . Frequency of Social Gatherings with Friends and Family: Not on file  . Attends Religious Services: Not on file  . Active Member of Clubs or  Organizations: Not on file  . Attends Archivist Meetings: Not on file  . Marital Status: Not on file    Outpatient Encounter Medications as of 09/05/2019  Medication Sig  . Calcium Carb-Cholecalciferol (CALCIUM + VITAMIN D3 PO) Take 1 tablet by mouth 2 (two) times daily.  . Cholecalciferol (VITAMIN D) 2000 units tablet Take 4,000 Units by mouth daily.   Marland Kitchen ELDERBERRY PO Take 1 tablet by mouth 2 (two) times daily as needed.  . vitamin B-12 (CYANOCOBALAMIN) 100 MCG tablet Take 100 mcg by mouth daily.  No facility-administered encounter medications on file as of 09/05/2019.    Activities of Daily Living In your present state of health, do you have any difficulty performing the following activities: 09/05/2019  Hearing? N  Vision? N  Difficulty concentrating or making decisions? N  Walking or climbing stairs? N  Dressing or bathing? N  Doing errands, shopping? N  Preparing Food and eating ? N  Using the Toilet? N  In the past six months, have you accidently leaked urine? N  Do you have problems with loss of bowel control? N  Managing your Medications? N  Managing your Finances? N  Housekeeping or managing your Housekeeping? N  Some recent data might be hidden    Patient Care Team: Doree Albee, MD as PCP - General (Internal Medicine) Harl Bowie Alphonse Guild, MD as PCP - Cardiology (Cardiology)    Assessment:   This is a routine wellness examination for Rhonda Andrews.  Exercise Activities and Dietary recommendations Current Exercise Habits: Home exercise routine, Type of exercise: walking(Frequent yard work), Time (Minutes): 40, Frequency (Times/Week): 7, Weekly Exercise (Minutes/Week): 280, Intensity: Moderate, Exercise limited by: None identified  Goals   None     Fall Risk Fall Risk  09/05/2019 07/15/2018 04/21/2018 03/11/2018 09/28/2017  Falls in the past year? 1 0 No No No  Number falls in past yr: 1 - - - -  Injury with Fall? 0 - - - -  Risk for fall due to : History of  fall(s) - - - -   Is the patient's home free of loose throw rugs in walkways, pet beds, electrical cords, etc?   yes      Grab bars in the bathroom? yes      Handrails on the stairs?   yes        Adequate lighting?   yes  Timed Get Up and Go performed: Not performed today as office visit was conducted remotely  Depression Screen PHQ 2/9 Scores 09/05/2019 07/15/2018 04/21/2018 03/11/2018  PHQ - 2 Score 0 0 0 0  PHQ- 9 Score - - - -     Cognitive Function     6CIT Screen 09/05/2019  What Year? 0 points  What month? 0 points  What time? 0 points  Count back from 20 0 points  Months in reverse 0 points  Repeat phrase 0 points  Total Score 0    Immunization History  Administered Date(s) Administered  . Influenza-Unspecified 07/29/2011  . Pneumococcal-Unspecified 04/28/2008  . Tdap 10/10/2005    Qualifies for Shingles Vaccine? yes  Screening Tests Health Maintenance  Topic Date Due  . DEXA SCAN  03/31/2008  . PNA vac Low Risk Adult (2 of 2 - PCV13) 04/28/2009  . TETANUS/TDAP  10/11/2015  . INFLUENZA VACCINE  Discontinued    Cancer Screenings: Lung: Low Dose CT Chest recommended if Age 30-80 years, 30 pack-year currently smoking OR have quit w/in 15years. Patient does not qualify. Breast:  Up to date on Mammogram? Yes - not applicable   Up to date of Bone Density/Dexa? No Colorectal: not applicable  Additional Screenings: Hepatitis C Screening: will not complete for now per patient preference     Plan:   Patient would be due for flu, pneumonia, tetanus, shingles vaccines.  She would like to hold off on these vaccines at this time.  She does believe she has had a pneumonia vaccine in the past but is not sure which ones or how long it was.  She would like to  determine which pneumonia vaccine was in when it was given prior to having another one administered.  She does not want to undergo sexual transmitted infection screening, does not require tobacco cessation conversation,  depression screening completed today, fall risk screening completed today, she would like to hold off on hepatitis C screening, does not qualify for lung cancer screening, and would like to hold off on bone density scan until the pandemic is resolved.  I have personally reviewed and noted the following in the patient's chart:   . Medical and social history . Use of alcohol, tobacco or illicit drugs  . Current medications and supplements . Functional ability and status . Nutritional status . Physical activity . Advanced directives . List of other physicians . Hospitalizations, surgeries, and ER visits in previous 12 months . Vitals . Screenings to include cognitive, depression, and falls . Referrals and appointments  In addition, I have reviewed and discussed with patient certain preventive protocols, quality metrics, and best practice recommendations. A written personalized care plan for preventive services as well as general preventive health recommendations were provided to patient.    She will follow-up as scheduled in approximately 3 months he will be due for another annual wellness visit in approximately 1 year.  Total time spent on this encounter was 32 minutes.  Ailene Ards, NP  09/05/2019

## 2019-09-09 ENCOUNTER — Telehealth: Payer: Medicare Other | Admitting: Cardiology

## 2019-10-11 ENCOUNTER — Telehealth: Payer: Medicare Other | Admitting: Cardiology

## 2019-10-13 DIAGNOSIS — Z23 Encounter for immunization: Secondary | ICD-10-CM | POA: Diagnosis not present

## 2019-10-19 ENCOUNTER — Encounter: Payer: Self-pay | Admitting: Cardiology

## 2019-10-19 ENCOUNTER — Telehealth (INDEPENDENT_AMBULATORY_CARE_PROVIDER_SITE_OTHER): Payer: Medicare Other | Admitting: Cardiology

## 2019-10-19 VITALS — BP 122/70 | HR 78 | Ht 64.0 in | Wt 138.0 lb

## 2019-10-19 DIAGNOSIS — R42 Dizziness and giddiness: Secondary | ICD-10-CM

## 2019-10-19 DIAGNOSIS — R002 Palpitations: Secondary | ICD-10-CM

## 2019-10-19 NOTE — Progress Notes (Signed)
Virtual Visit via Telephone Note   This visit type was conducted due to national recommendations for restrictions regarding the COVID-19 Pandemic (e.g. social distancing) in an effort to limit this patient's exposure and mitigate transmission in our community.  Due to her co-morbid illnesses, this patient is at least at moderate risk for complications without adequate follow up.  This format is felt to be most appropriate for this patient at this time.  The patient did not have access to video technology/had technical difficulties with video requiring transitioning to audio format only (telephone).  All issues noted in this document were discussed and addressed.  No physical exam could be performed with this format.  Please refer to the patient's chart for her  consent to telehealth for [facility]   Date:  10/19/2019   ID:  MERIDEE BINNER, DOB 28-Mar-1941, MRN [id number]  Patient Location: Home Provider Location: Office  PCP:  Doree Albee, MD  Cardiologist:  Carlyle Dolly, MD  Electrophysiologist:  None   Evaluation Performed:  Follow-Up Visit  Chief Complaint:  Follow up visit  History of Present Illness:    Rhonda Andrews is a 79 y.o. female seen today for follow up of the following medical problems.   1. Palpitations/Dizziness - previous holter 05/2009 showed no significant arrhythmias.  - duringprioradmission did haveshort episode of PSVT - she was changed from propanolol to atenolol. With change had increase in symptoms and she changed herself back to propanolol - she reports short acting propanolol does not work as well, prefers long acting. - diltiazemcausedlow bp'sat home per her report, she stopped taking.  - tried midodine x 2 weeks, SBP 130s.Reports insomnia and she stopped taking. We had tried to see if could stabilizer her bp to allow more stable use of her beta blocker   - no recent symptoms - she remains off propanolol       The patient does not have symptoms concerning for COVID-19 infection (fever, chills, cough, or new shortness of breath).    Past Medical History:  Diagnosis Date  . Anxiety   . 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 Clostridium difficile diarrhea 02/05/2015  . Diabetes mellitus without complication (HCC)    prediabetes  . Dizziness   . 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 Hypertension   . IBS (irritable bowel syndrome)   . Lactose intolerance   . Palpitation   . Palpitations   . Vitamin D deficiency disease 06/23/2019   Past Surgical History:  Procedure Laterality Date  . ABDOMINAL HYSTERECTOMY     ovaries remained  . CHOLECYSTECTOMY    . COLONOSCOPY  2010  . COLONOSCOPY WITH PROPOFOL N/A 05/21/2018   Procedure: COLONOSCOPY WITH PROPOFOL;  Surgeon: Gatha Mayer, MD;  Location: [facility];  Service: Endoscopy;  Laterality: N/A;  with FMT  . 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 KNEE ARTHROSCOPY Right   . Precancerous  Tissue - Nose  February 14-2014     No outpatient medications have been marked as taking for the 10/19/19 encounter (Appointment) with Arnoldo Lenis, MD.     Allergies:   Hydrocodone and Prednisone   Social History   Tobacco Use  . Smoking status: Never Smoker  . Smokeless tobacco: Never Used  Substance Use Topics  . Alcohol use: No    Alcohol/week: 0.0 standard drinks  . Drug use: No  Family Hx: The patient's family history includes Alcohol abuse in her paternal grandfather; Cancer in her maternal grandfather and maternal grandmother; Cirrhosis in her paternal grandfather; Colon cancer in her mother; Heart disease in her sister; Hypertension in her mother; Lymphoma in her mother; Migraines in her mother; Other in her father and paternal grandmother; Seizures in her sister; Thyroid disease in her sister.  ROS:   Please see the history of present  illness.     All other systems reviewed and are negative.   Prior CV studies:   The following studies were reviewed today:    Labs/Other Tests and Data Reviewed:    EKG:  No ECG reviewed.  Recent Labs: 06/23/2019: ALT 15; BUN 23; Creat 0.85; Potassium 4.5; Sodium 142   Recent Lipid Panel No results found for: CHOL, TRIG, HDL, CHOLHDL, LDLCALC, LDLDIRECT  Wt Readings from Last 3 Encounters:  09/05/19 138 lb (62.6 kg)  06/23/19 138 lb (62.6 kg)  06/14/19 138 lb 1.6 oz (62.6 kg)     Objective:    Vital Signs:   Today's Vitals   10/19/19 0822  BP: 122/70  Pulse: 78  Weight: 138 lb (62.6 kg)  Height: 5\' 4"  (1.626 m)   Body mass index is 23.69 kg/m. Normal affect. Normal speech pattern and tone. [place], no apparent distress. No audible signs of SOB or wheezing.   ASSESSMENT & PLAN:    1. Palpitations/Dizziness -no recent symptoms, no longer on beta blocker - continue to monitor at this time   Follow up just as needed   COVID-19 Education: The signs and symptoms of COVID-19 were discussed with the patient and how to seek care for testing (follow up with PCP or arrange E-visit).  The importance of social distancing was discussed today.  Time:   Today, I have spent 14 minutes with the patient with telehealth technology discussing the above problems.     Medication Adjustments/Labs and Tests Ordered: Current medicines are reviewed at length with the patient today.  Concerns regarding medicines are outlined above.   Tests Ordered: No orders of the defined types were placed in this encounter.   Medication Changes: No orders of the defined types were placed in this encounter.   Follow Up:  Either In Person or Virtual prn  Signed, Carlyle Dolly, MD  10/19/2019 7:54 AM    [place]

## 2019-10-19 NOTE — Patient Instructions (Signed)
Medication Instructions:   Your physician recommends that you continue on your current medications as directed. Please refer to the Current Medication list given to you today.  Labwork:  none  Testing/Procedures:  none  Follow-Up:  Your physician recommends that you schedule a follow-up appointment in: as needed.  Any Other Special Instructions Will Be Listed Below (If Applicable).  If you need a refill on your cardiac medications before your next appointment, please call your pharmacy. 

## 2019-12-05 ENCOUNTER — Ambulatory Visit (INDEPENDENT_AMBULATORY_CARE_PROVIDER_SITE_OTHER): Payer: Medicare Other | Admitting: Internal Medicine

## 2019-12-27 ENCOUNTER — Ambulatory Visit (INDEPENDENT_AMBULATORY_CARE_PROVIDER_SITE_OTHER): Payer: Medicare Other | Admitting: Internal Medicine

## 2019-12-27 ENCOUNTER — Other Ambulatory Visit: Payer: Self-pay

## 2019-12-27 ENCOUNTER — Encounter (INDEPENDENT_AMBULATORY_CARE_PROVIDER_SITE_OTHER): Payer: Self-pay | Admitting: Internal Medicine

## 2019-12-27 VITALS — BP 120/80 | HR 62 | Temp 97.3°F | Ht 64.0 in | Wt 140.0 lb

## 2019-12-27 DIAGNOSIS — Z23 Encounter for immunization: Secondary | ICD-10-CM | POA: Diagnosis not present

## 2019-12-27 DIAGNOSIS — I10 Essential (primary) hypertension: Secondary | ICD-10-CM

## 2019-12-27 DIAGNOSIS — E559 Vitamin D deficiency, unspecified: Secondary | ICD-10-CM | POA: Diagnosis not present

## 2019-12-27 NOTE — Addendum Note (Signed)
Addended by: Saintclair Halsted A on: 12/27/2019 01:39 PM   Modules accepted: Orders

## 2019-12-27 NOTE — Progress Notes (Signed)
Metrics: Intervention Frequency ACO  Documented Smoking Status Yearly  Screened one or more times in 24 months  Cessation Counseling or  Active cessation medication Past 24 months  Past 24 months   Guideline developer: UpToDate (See UpToDate for funding source) Date Released: 2014       Wellness Office Visit  Subjective:  Patient ID: Rhonda Andrews, female    DOB: 03-02-1941  Age: 79 y.o. MRN: [id number]  CC: This lady comes in for follow-up of hypertension, vitamin D deficiency, previous history of diabetes but no longer, status post bowel transplant. HPI  She is doing very well.  She is somewhat tired today because she has been cleaning out a barn. She continues with vitamin D3 5000 units daily. She did take her first dose of Moderna vaccine but unfortunately seem to have significant allergic reaction to it and did not get the second dose which is quite understandable. She also history of palpitations which seems to have resolved. Past Medical History:  Diagnosis Date  . Anxiety   . 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 Clostridium difficile diarrhea 02/05/2015  . Diabetes mellitus without complication (HCC)    prediabetes  . Dizziness   . 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 Hypertension   . IBS (irritable bowel syndrome)   . Lactose intolerance   . Palpitation   . Palpitations   . Vitamin D deficiency disease 06/23/2019      Family History  Problem Relation Age of Onset  . Lymphoma Mother   . Colon cancer Mother   . Hypertension Mother   . Migraines Mother   . Other Father        Wagner's Granulermatosis  . Seizures Sister   . Cancer Maternal Grandmother        brain  . Cancer Maternal Grandfather        brain  . Other Paternal Grandmother        blood clot - DVT following surgery  . Alcohol abuse Paternal Grandfather   . Cirrhosis Paternal Grandfather   . Thyroid disease Sister   . Heart disease Sister     Social History   Social History Narrative   Married. Has a daughter. 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   Eats all food groups and has a great appetite.    Walks in house daily in her house, 1.5 miles a day.   Right handed   Quit drinking caffeine 15 years ago   No EtOH/tobacco   Social History   Tobacco Use  . Smoking status: Never Smoker  . Smokeless tobacco: Never Used  Substance Use Topics  . Alcohol use: No    Alcohol/week: 0.0 standard drinks    Current Meds  Medication Sig  . Calcium Carb-Cholecalciferol (CALCIUM + VITAMIN D3 PO) Take 1 tablet by mouth 2 (two) times daily.  . Cholecalciferol (VITAMIN D-3) 125 MCG (5000 UT) TABS Take 1 tablet by mouth daily.  Marland Kitchen ELDERBERRY PO Take 1 tablet by mouth 2 (two) times daily as needed.  . vitamin B-12 (CYANOCOBALAMIN) 100 MCG tablet Take 100 mcg by mouth daily.  . [DISCONTINUED] Cholecalciferol (VITAMIN D) 2000 units tablet Take 4,000 Units by mouth daily.        Objective:   Today's Vitals: BP 120/80 (BP Location: Right Arm, Patient Position: Sitting, Cuff Size: Normal)   Pulse 62   Temp (!) 97.3 F (36.3 C) (Temporal)   Ht 5\' 4"  (  1.626 m)   Wt 140 lb (63.5 kg)   SpO2 94%   BMI 24.03 kg/m  Vitals with BMI 12/27/2019 10/19/2019 09/05/2019  Height 5\' 4"  5\' 4"  5\' 4"   Weight 140 lbs 138 lbs 138 lbs  BMI 24.02 123XX123 123XX123  Systolic 123456 123XX123 123XX123  Diastolic 80 70 78  Pulse 62 78 80     Physical Exam   She looks systemically well.  Her weight is stable.  Blood pressure is excellent for age.  Alert and orientated and cheerful with no focal neurological signs.    Assessment   1. Essential hypertension   2. Vitamin D deficiency disease       Tests ordered No orders of the defined types were placed in this encounter.    Plan: 1. She had a history of hypertension but this is under good control without medications now. 2. She will continue with vitamin D3 supplementation for vitamin D  deficiency. 3. Follow-up with Judson Roch in about 6 months time.  She was given Prevnar 13 vaccination today.  In the future, bone density scan may be recommended.   No orders of the defined types were placed in this encounter.   Doree Albee, MD

## 2020-02-13 DIAGNOSIS — M546 Pain in thoracic spine: Secondary | ICD-10-CM | POA: Diagnosis not present

## 2020-02-13 DIAGNOSIS — M9901 Segmental and somatic dysfunction of cervical region: Secondary | ICD-10-CM | POA: Diagnosis not present

## 2020-02-13 DIAGNOSIS — G441 Vascular headache, not elsewhere classified: Secondary | ICD-10-CM | POA: Diagnosis not present

## 2020-02-13 DIAGNOSIS — M9902 Segmental and somatic dysfunction of thoracic region: Secondary | ICD-10-CM | POA: Diagnosis not present

## 2020-02-13 DIAGNOSIS — S338XXA Sprain of other parts of lumbar spine and pelvis, initial encounter: Secondary | ICD-10-CM | POA: Diagnosis not present

## 2020-02-13 DIAGNOSIS — M9903 Segmental and somatic dysfunction of lumbar region: Secondary | ICD-10-CM | POA: Diagnosis not present

## 2020-02-20 ENCOUNTER — Telehealth (INDEPENDENT_AMBULATORY_CARE_PROVIDER_SITE_OTHER): Payer: Self-pay | Admitting: Gastroenterology

## 2020-02-20 NOTE — Telephone Encounter (Signed)
I can see patient on Wednesday for evaluation if she would like and we can do stool studies. Thanks

## 2020-02-20 NOTE — Telephone Encounter (Signed)
Patient came into the office stated she had eaten something that gave her diarrhea - please advise - [phone]

## 2020-03-13 DIAGNOSIS — M9903 Segmental and somatic dysfunction of lumbar region: Secondary | ICD-10-CM | POA: Diagnosis not present

## 2020-03-13 DIAGNOSIS — M9902 Segmental and somatic dysfunction of thoracic region: Secondary | ICD-10-CM | POA: Diagnosis not present

## 2020-03-13 DIAGNOSIS — M9901 Segmental and somatic dysfunction of cervical region: Secondary | ICD-10-CM | POA: Diagnosis not present

## 2020-03-13 DIAGNOSIS — S134XXA Sprain of ligaments of cervical spine, initial encounter: Secondary | ICD-10-CM | POA: Diagnosis not present

## 2020-03-13 DIAGNOSIS — M47816 Spondylosis without myelopathy or radiculopathy, lumbar region: Secondary | ICD-10-CM | POA: Diagnosis not present

## 2020-03-13 DIAGNOSIS — S233XXA Sprain of ligaments of thoracic spine, initial encounter: Secondary | ICD-10-CM | POA: Diagnosis not present

## 2020-03-16 DIAGNOSIS — H43811 Vitreous degeneration, right eye: Secondary | ICD-10-CM | POA: Diagnosis not present

## 2020-04-02 DIAGNOSIS — L570 Actinic keratosis: Secondary | ICD-10-CM | POA: Diagnosis not present

## 2020-04-02 DIAGNOSIS — L821 Other seborrheic keratosis: Secondary | ICD-10-CM | POA: Diagnosis not present

## 2020-04-02 DIAGNOSIS — Z85828 Personal history of other malignant neoplasm of skin: Secondary | ICD-10-CM | POA: Diagnosis not present

## 2020-04-09 DIAGNOSIS — M47816 Spondylosis without myelopathy or radiculopathy, lumbar region: Secondary | ICD-10-CM | POA: Diagnosis not present

## 2020-04-09 DIAGNOSIS — M9902 Segmental and somatic dysfunction of thoracic region: Secondary | ICD-10-CM | POA: Diagnosis not present

## 2020-04-09 DIAGNOSIS — S233XXA Sprain of ligaments of thoracic spine, initial encounter: Secondary | ICD-10-CM | POA: Diagnosis not present

## 2020-04-09 DIAGNOSIS — M9901 Segmental and somatic dysfunction of cervical region: Secondary | ICD-10-CM | POA: Diagnosis not present

## 2020-04-09 DIAGNOSIS — M9903 Segmental and somatic dysfunction of lumbar region: Secondary | ICD-10-CM | POA: Diagnosis not present

## 2020-04-09 DIAGNOSIS — S134XXA Sprain of ligaments of cervical spine, initial encounter: Secondary | ICD-10-CM | POA: Diagnosis not present

## 2020-04-12 ENCOUNTER — Other Ambulatory Visit: Payer: Medicare Other

## 2020-04-17 ENCOUNTER — Other Ambulatory Visit (INDEPENDENT_AMBULATORY_CARE_PROVIDER_SITE_OTHER): Payer: Self-pay | Admitting: Nurse Practitioner

## 2020-04-17 MED ORDER — MECLIZINE HCL 12.5 MG PO TABS: 12.5000 mg | ORAL_TABLET | Freq: Three times a day (TID) | ORAL | 2 refills | Status: AC | PRN

## 2020-04-25 ENCOUNTER — Telehealth (INDEPENDENT_AMBULATORY_CARE_PROVIDER_SITE_OTHER): Payer: Self-pay | Admitting: Nurse Practitioner

## 2020-04-25 ENCOUNTER — Other Ambulatory Visit (INDEPENDENT_AMBULATORY_CARE_PROVIDER_SITE_OTHER): Payer: Self-pay | Admitting: Internal Medicine

## 2020-04-25 ENCOUNTER — Telehealth: Payer: Self-pay | Admitting: Cardiology

## 2020-04-25 MED ORDER — PROPRANOLOL HCL 60 MG PO TABS: 60.0000 mg | ORAL_TABLET | Freq: Every day | ORAL | 1 refills | Status: DC

## 2020-04-25 NOTE — Telephone Encounter (Signed)
Okay, I just sent it now.

## 2020-04-25 NOTE — Telephone Encounter (Signed)
Reports being diagnosed with covid and heart rate has been staying in the 90's. Requesting to be put back on propranolol. Advised that propranolol was removed from her list 03/2019 per her request. Advised that message would be sent to provider and she may not hear back from our office until next week and that she should check with her PCP in the meantime. Verbalized understanding.

## 2020-04-25 NOTE — Telephone Encounter (Signed)
Pt requesting refill for propranolol er 60mg , to [facility]. [place]. (not found on list)   Please call [phone]   Thanks renee

## 2020-04-26 NOTE — Telephone Encounter (Signed)
From epic looks like pcp was abel to restart propanolol   Zandra Abts MD

## 2020-05-02 ENCOUNTER — Telehealth: Payer: Self-pay | Admitting: Physician Assistant

## 2020-05-02 NOTE — Telephone Encounter (Signed)
Patient called after hour answering service due to elevated HR. Recently diagnosed with COVID 19 on 8/12. BP and HR has been elevated since. She has been restarted on propranolol. Short acting propronalol daily dosing was sent to her pharmacy last week. BP earlier today was 165/100 HR 108. She took a dose of propranolol around 5AM and another dose around 4 pm. BP now is 155//100 HR 110. She denies any other discomfort such as chest pain or shortness of breath.  I advised the patient to rest for now and recheck BP around 8PM today, if SBP is >160, she can take a third dose of propranolol.   Will need either PCP or cardiology visit. Need EKG to make sure she is in sinus rhythm. Potentially consider to prescribe a longer acting medication for BP.   Will forward to our staff to see if sooner appt can be scheduled with Dr. Harl Bowie or APP. Since she had COVID 19 on 8/12, virtual visit is ok. We can get the EKG later.

## 2020-05-03 ENCOUNTER — Telehealth (INDEPENDENT_AMBULATORY_CARE_PROVIDER_SITE_OTHER): Payer: Self-pay

## 2020-05-03 ENCOUNTER — Other Ambulatory Visit (INDEPENDENT_AMBULATORY_CARE_PROVIDER_SITE_OTHER): Payer: Self-pay | Admitting: Nurse Practitioner

## 2020-05-03 DIAGNOSIS — R002 Palpitations: Secondary | ICD-10-CM

## 2020-05-03 DIAGNOSIS — I10 Essential (primary) hypertension: Secondary | ICD-10-CM

## 2020-05-03 MED ORDER — PROPRANOLOL HCL ER 60 MG PO CP24: 60.0000 mg | ORAL_CAPSULE | Freq: Every day | ORAL | 3 refills | Status: DC

## 2020-05-03 NOTE — Telephone Encounter (Signed)
LM for pt to return call - requested to schedulers that appt be added

## 2020-05-03 NOTE — Telephone Encounter (Signed)
Can see sept 8 at 940AM. If covid + 8/12 im fine to see her in office, Staci please arrange   Zandra Abts MD

## 2020-05-03 NOTE — Telephone Encounter (Signed)
Rhonda Andrews is aware

## 2020-05-03 NOTE — Telephone Encounter (Signed)
Pt aware of appt.

## 2020-05-03 NOTE — Progress Notes (Signed)
Order for refill of propranolol

## 2020-05-03 NOTE — Telephone Encounter (Signed)
Rhonda Andrews is stating that a refill of Propanolol 60 mg was sent in to [facility] and it should have been the Propanolol EX 60 mg, please advise?

## 2020-05-03 NOTE — Telephone Encounter (Signed)
I have sent the extended release capsule version of propranolol 60mg /capsule to her pharmacy for her.

## 2020-05-08 ENCOUNTER — Telehealth: Payer: Self-pay | Admitting: Cardiology

## 2020-05-08 NOTE — Telephone Encounter (Signed)
°  Patient Consent for Virtual Visit         Rhonda Andrews has provided verbal consent on 05/08/2020 for a virtual visit (video or telephone).   CONSENT FOR VIRTUAL VISIT FOR:  Rhonda Andrews  By participating in this virtual visit I agree to the following:  I hereby voluntarily request, consent and authorize [facility] and its employed or contracted physicians, physician assistants, nurse practitioners or other licensed health care professionals (the Practitioner), to provide me with telemedicine health care services (the Services") as deemed necessary by the treating Practitioner. I acknowledge and consent to receive the Services by the Practitioner via telemedicine. I understand that the telemedicine visit will involve communicating with the Practitioner through live audiovisual communication technology and the disclosure of certain medical information by electronic transmission. I acknowledge that I have been given the opportunity to request an in-person assessment or other available alternative prior to the telemedicine visit and am voluntarily participating in the telemedicine visit.  I understand that I have the right to withhold or withdraw my consent to the use of telemedicine in the course of my care at any time, without affecting my right to future care or treatment, and that the Practitioner or I may terminate the telemedicine visit at any time. I understand that I have the right to inspect all information obtained and/or recorded in the course of the telemedicine visit and may receive copies of available information for a reasonable fee.  I understand that some of the potential risks of receiving the Services via telemedicine include:   Delay or interruption in medical evaluation due to technological equipment failure or disruption;  Information transmitted may not be sufficient (e.g. poor resolution of images) to allow for appropriate medical decision making by the  Practitioner; and/or   In rare instances, security protocols could fail, causing a breach of personal health information.  Furthermore, I acknowledge that it is my responsibility to provide information about my medical history, conditions and care that is complete and accurate to the best of my ability. I acknowledge that Practitioner's advice, recommendations, and/or decision may be based on factors not within their control, such as incomplete or inaccurate data provided by me or distortions of diagnostic images or specimens that may result from electronic transmissions. I understand that the practice of medicine is not an exact science and that Practitioner makes no warranties or guarantees regarding treatment outcomes. I acknowledge that a copy of this consent can be made available to me via my patient portal ([place]), or I can request a printed copy by calling the office of [facility].    I understand that my insurance will be billed for this visit.   I have read or had this consent read to me.  I understand the contents of this consent, which adequately explains the benefits and risks of the Services being provided via telemedicine.   I have been provided ample opportunity to ask questions regarding this consent and the Services and have had my questions answered to my satisfaction.  I give my informed consent for the services to be provided through the use of telemedicine in my medical care

## 2020-05-09 ENCOUNTER — Encounter: Payer: Self-pay | Admitting: Cardiology

## 2020-05-09 ENCOUNTER — Telehealth (INDEPENDENT_AMBULATORY_CARE_PROVIDER_SITE_OTHER): Payer: Medicare Other | Admitting: Cardiology

## 2020-05-09 VITALS — BP 120/79 | HR 71 | Ht 64.0 in | Wt 135.0 lb

## 2020-05-09 DIAGNOSIS — R002 Palpitations: Secondary | ICD-10-CM

## 2020-05-09 NOTE — Progress Notes (Signed)
Virtual Visit via Telephone Note   This visit type was conducted due to national recommendations for restrictions regarding the COVID-19 Pandemic (e.g. social distancing) in an effort to limit this patient's exposure and mitigate transmission in our community.  Due to her co-morbid illnesses, this patient is at least at moderate risk for complications without adequate follow up.  This format is felt to be most appropriate for this patient at this time.  The patient did not have access to video technology/had technical difficulties with video requiring transitioning to audio format only (telephone).  All issues noted in this document were discussed and addressed.  No physical exam could be performed with this format.  Please refer to the patient's chart for her  consent to telehealth for [facility].    Date:  05/09/2020   ID:  [name], DOB 05/27/1941, MRN [id number] The patient was identified using 2 identifiers.  Patient Location: Home Provider Location: Office/Clinic  PCP:  Ailene Ards, NP  Cardiologist:  Carlyle Dolly, MD  Electrophysiologist:  None   Evaluation Performed:  Follow-Up Visit  Chief Complaint:  Follow up  History of Present Illness:    Rhonda Andrews is a 79 y.o. female seen today for follow up of the following medical problems.   1. Palpitations/Dizziness - previous holter 05/2009 showed no significant arrhythmias.  - duringprioradmission did haveshort episode of PSVT - she was changed from propanolol to atenolol. With change had increase in symptoms and she changed herself back to propanolol - she reports short acting propanolol does not work as well, prefers long acting. - diltiazemcausedlow bp'sat home per her report, she stopped taking.  - tried midodine x 2 weeks, SBP 130s.Reports insomnia and she stopped taking.We had tried to see if could stabilize her bp to allow more stable use of her beta blocker  - was able to get  off propranolol last visit without symtoms   - recent covid infection, she was vaccinated - since then recurrent palpitations. - HRs 90-105 - restarted taking propranolol. She has unconvnetional way of taking propranolol long acting that she has done for many years, she splits the individual capsule into 2 separate doses. We have tried several other doses and meds which she did not tolerate and thus have accepted this atypical regimen.  - home bp's 140s/80s - husband in rehab since covid, he was at [facility]    The patient does not have symptoms concerning for COVID-19 infection (fever, chills, cough, or new shortness of breath).    Past Medical History:  Diagnosis Date  . Anxiety   . 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 Clostridium difficile diarrhea 02/05/2015  . Diabetes mellitus without complication (HCC)    prediabetes  . Dizziness   . 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 Hypertension   . IBS (irritable bowel syndrome)   . Lactose intolerance   . Palpitation   . Palpitations   . Vitamin D deficiency disease 06/23/2019   Past Surgical History:  Procedure Laterality Date  . ABDOMINAL HYSTERECTOMY     ovaries remained  . CHOLECYSTECTOMY    . COLONOSCOPY  2010  . COLONOSCOPY WITH PROPOFOL N/A 05/21/2018   Procedure: COLONOSCOPY WITH PROPOFOL;  Surgeon: Gatha Mayer, MD;  Location: [facility]COPY;  Service: Endoscopy;  Laterality: N/A;  with FMT  . 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 KNEE ARTHROSCOPY Right   .  Precancerous  Tissue - Nose  February 14-2014     No outpatient medications have been marked as taking for the 05/09/20 encounter (Appointment) with Arnoldo Lenis, MD.     Allergies:   Hydrocodone and Prednisone   Social History   Tobacco Use  . Smoking status: Never Smoker  . Smokeless tobacco: Never Used  Vaping Use  . Vaping Use: Never used    Substance Use Topics  . Alcohol use: No    Alcohol/week: 0.0 standard drinks  . Drug use: No     Family Hx: The patient's family history includes Alcohol abuse in her paternal grandfather; Cancer in her maternal grandfather and maternal grandmother; Cirrhosis in her paternal grandfather; Colon cancer in her mother; Heart disease in her sister; Hypertension in her mother; Lymphoma in her mother; Migraines in her mother; Other in her father and paternal grandmother; Seizures in her sister; Thyroid disease in her sister.  ROS:   Please see the history of present illness.     All other systems reviewed and are negative.   Prior CV studies:   The following studies were reviewed today:    Labs/Other Tests and Data Reviewed:    EKG:  No ECG reviewed.  Recent Labs: 06/23/2019: ALT 15; BUN 23; Creat 0.85; Potassium 4.5; Sodium 142   Recent Lipid Panel No results found for: CHOL, TRIG, HDL, CHOLHDL, LDLCALC, LDLDIRECT  Wt Readings from Last 3 Encounters:  12/27/19 140 lb (63.5 kg)  10/19/19 138 lb (62.6 kg)  09/05/19 138 lb (62.6 kg)     Objective:    Vital Signs:   Today's Vitals   05/09/20 0928  BP: 120/79  Pulse: 71  Weight: 135 lb (61.2 kg)  Height: 5\' 4"  (1.626 m)   Body mass index is 23.17 kg/m. Normal affect. Normal speech pattern and tone. Comfortable, no apparent distress. No audible signs of sob or wheezing.   ASSESSMENT & PLAN:    1. Palpitations -long history, had resolved until recently being exacerbated with a recent covid infection - back on propranolol which is controlling symptoms, continue. May be able to stop again in the future the further she gets out from her infection. Mildly elevated bp's we will also monitor at this time - arrange nursing visit for EKG check   Follow up just as needed  COVID-19 Education: The signs and symptoms of COVID-19 were discussed with the patient and how to seek care for testing (follow up with PCP or arrange  E-visit).  The importance of social distancing was discussed today.  Time:   Today, I have spent 13 minutes with the patient with telehealth technology discussing the above problems.     Medication Adjustments/Labs and Tests Ordered: Current medicines are reviewed at length with the patient today.  Concerns regarding medicines are outlined above.   Tests Ordered: No orders of the defined types were placed in this encounter.   Medication Changes: No orders of the defined types were placed in this encounter.   Follow Up:  In Person in 2 month(s)  Signed, Carlyle Dolly, MD  05/09/2020 8:05 AM    [place]

## 2020-05-09 NOTE — Patient Instructions (Addendum)
Your physician recommends that you schedule a follow-up appointment in: [place]  Your physician recommends that you continue on your current medications as directed. Please refer to the Current Medication list given to you today.  NURSE VISIT IN 1 WEEK FOR EKG   Thank you for choosing [facility]!!

## 2020-05-15 ENCOUNTER — Telehealth (INDEPENDENT_AMBULATORY_CARE_PROVIDER_SITE_OTHER): Payer: Medicare Other | Admitting: Internal Medicine

## 2020-05-15 ENCOUNTER — Encounter (INDEPENDENT_AMBULATORY_CARE_PROVIDER_SITE_OTHER): Payer: Self-pay | Admitting: Internal Medicine

## 2020-05-15 DIAGNOSIS — R05 Cough: Secondary | ICD-10-CM

## 2020-05-15 DIAGNOSIS — R059 Cough, unspecified: Secondary | ICD-10-CM

## 2020-05-15 NOTE — Progress Notes (Signed)
Metrics: Intervention Frequency ACO  Documented Smoking Status Yearly  Screened one or more times in 24 months  Cessation Counseling or  Active cessation medication Past 24 months  Past 24 months   Guideline developer: UpToDate (See UpToDate for funding source) Date Released: 2014       Wellness Office Visit  Subjective:  Patient ID: Rhonda Andrews, female    DOB: 09-05-1940  Age: 79 y.o. MRN: [id number]  CC: This is an audio telemedicine visit with the permission of the patient who is at home and I am in my office. I was able to easily identify using 2 identifiers. Cough. HPI  She describes symptoms of productive cough of white sputum for the last 24 hours.  She denies any sneezing or any significant nasal congestion.  She denies any significant fever or body aches.  She did have Covid 19+ test 1 month ago.  This was done because of symptoms of cough and runny nose. She systemically feels well at the present time.  She says that when she goes outside, her cough improves so she wonders whether her cough is from some allergen in the house. Past Medical History:  Diagnosis Date  . Anxiety   . 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 Clostridium difficile diarrhea 02/05/2015  . Diabetes mellitus without complication (HCC)    prediabetes  . Dizziness   . 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 Hypertension   . IBS (irritable bowel syndrome)   . Lactose intolerance   . Palpitation   . Palpitations   . Vitamin D deficiency disease 06/23/2019   Past Surgical History:  Procedure Laterality Date  . ABDOMINAL HYSTERECTOMY     ovaries remained  . CHOLECYSTECTOMY    . COLONOSCOPY  2010  . COLONOSCOPY WITH PROPOFOL N/A 05/21/2018   Procedure: COLONOSCOPY WITH PROPOFOL;  Surgeon: Gatha Mayer, MD;  Location: [facility];  Service: Endoscopy;  Laterality: N/A;  with FMT  . 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 KNEE ARTHROSCOPY Right   . Precancerous  Tissue - Nose  February 14-2014     Family History  Problem Relation Age of Onset  . Lymphoma Mother   . Colon cancer Mother   . Hypertension Mother   . Migraines Mother   . Other Father        Wagner's Granulermatosis  . Seizures Sister   . Cancer Maternal Grandmother        brain  . Cancer Maternal Grandfather        brain  . Other Paternal Grandmother        blood clot - DVT following surgery  . Alcohol abuse Paternal Grandfather   . Cirrhosis Paternal Grandfather   . Thyroid disease Sister   . Heart disease Sister     Social History   Social History Narrative   Married. Has a daughter. 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   Eats all food groups and has a great appetite.    Walks in house daily in her house, 1.5 miles a day.   Right handed   Quit drinking caffeine 15 years ago   No EtOH/tobacco   Social History   Tobacco Use  . Smoking status: Never Smoker  . Smokeless tobacco: Never Used  Substance Use Topics  . Alcohol use: No    Alcohol/week:  0.0 standard drinks    Current Meds  Medication Sig  . Calcium Carb-Cholecalciferol (CALCIUM + VITAMIN D3 PO) Take 1 tablet by mouth 2 (two) times daily.  . Cholecalciferol (VITAMIN D-3) 125 MCG (5000 UT) TABS Take 1 tablet by mouth daily.  . meclizine (ANTIVERT) 12.5 MG tablet Take 1 tablet (12.5 mg total) by mouth 3 (three) times daily as needed for dizziness.  . propranolol ER (INDERAL LA) 60 MG 24 hr capsule Take 1 capsule (60 mg total) by mouth daily.  . vitamin B-12 (CYANOCOBALAMIN) 100 MCG tablet Take 100 mcg by mouth daily.      Depression screen [facility] 2/9 12/27/2019 09/05/2019 07/15/2018 04/21/2018 03/11/2018  Decreased Interest 0 0 0 0 0  Down, Depressed, Hopeless 0 0 0 0 0  PHQ - 2 Score 0 0 0 0 0  Altered sleeping - - - - -  Tired, decreased energy - - - - -  Change in  appetite - - - - -  Feeling bad or failure about yourself  - - - - -  Trouble concentrating - - - - -  Moving slowly or fidgety/restless - - - - -  Suicidal thoughts - - - - -  PHQ-9 Score - - - - -     Objective:   Today's Vitals: There were no vitals taken for this visit. Vitals with BMI 05/15/2020 05/09/2020 12/27/2019  Height (No Data) 5\' 4"  5\' 4"   Weight (No Data) 135 lbs 140 lbs  BMI - 76.22 63.33  Systolic (No Data) 545 625  Diastolic (No Data) 79 80  Pulse - 71 62     Physical Exam  Her speech is normal on the phone and she appears to be alert and orientated. She tells me that her oxygen saturations at home are above 95% all the time.     Assessment   1. Cough       Tests ordered No orders of the defined types were placed in this encounter.    Plan: 1. I told her that I do not think clinically she has any severe COVID-19 disease at this point and she may have allergies related to some allergen in the house.  I recommended over-the-counter Zyrtec and warned her of possible side effects especially drowsiness.  If she does not improve with the simple measures, she should give [place] a call back. 2. This phone call lasted 5 minutes and 45 seconds   No orders of the defined types were placed in this encounter.   Doree Albee, MD

## 2020-05-16 ENCOUNTER — Ambulatory Visit: Payer: Medicare Other

## 2020-05-17 ENCOUNTER — Emergency Department (HOSPITAL_COMMUNITY)
Admission: EM | Admit: 2020-05-17 | Discharge: 2020-05-18 | Disposition: A | Discharge status: Home / Self Care | Payer: Medicare Other | Attending: Emergency Medicine | Admitting: Emergency Medicine

## 2020-05-17 ENCOUNTER — Emergency Department (HOSPITAL_COMMUNITY): Payer: Medicare Other

## 2020-05-17 ENCOUNTER — Other Ambulatory Visit: Payer: Self-pay

## 2020-05-17 DIAGNOSIS — Z96651 Presence of right artificial knee joint: Secondary | ICD-10-CM | POA: Diagnosis not present

## 2020-05-17 DIAGNOSIS — I447 Left bundle-branch block, unspecified: Secondary | ICD-10-CM | POA: Diagnosis not present

## 2020-05-17 DIAGNOSIS — I4891 Unspecified atrial fibrillation: Secondary | ICD-10-CM | POA: Diagnosis not present

## 2020-05-17 DIAGNOSIS — R002 Palpitations: Secondary | ICD-10-CM | POA: Insufficient documentation

## 2020-05-17 DIAGNOSIS — Z79899 Other long term (current) drug therapy: Secondary | ICD-10-CM | POA: Insufficient documentation

## 2020-05-17 DIAGNOSIS — I10 Essential (primary) hypertension: Secondary | ICD-10-CM | POA: Diagnosis not present

## 2020-05-17 DIAGNOSIS — R0689 Other abnormalities of breathing: Secondary | ICD-10-CM | POA: Diagnosis not present

## 2020-05-17 DIAGNOSIS — R000 Tachycardia, unspecified: Secondary | ICD-10-CM | POA: Diagnosis not present

## 2020-05-17 DIAGNOSIS — I499 Cardiac arrhythmia, unspecified: Secondary | ICD-10-CM | POA: Diagnosis not present

## 2020-05-17 DIAGNOSIS — E119 Type 2 diabetes mellitus without complications: Secondary | ICD-10-CM | POA: Insufficient documentation

## 2020-05-17 LAB — CBC
HCT: 49.4 % — ABNORMAL HIGH (ref 36.0–46.0)
Hemoglobin: 15.8 g/dL — ABNORMAL HIGH (ref 12.0–15.0)
MCH: 29.2 pg (ref 26.0–34.0)
MCHC: 32 g/dL (ref 30.0–36.0)
MCV: 91.3 fL (ref 80.0–100.0)
Platelets: 275 10*3/uL (ref 150–400)
RBC: 5.41 MIL/uL — ABNORMAL HIGH (ref 3.87–5.11)
RDW: 12.9 % (ref 11.5–15.5)
WBC: 9.2 10*3/uL (ref 4.0–10.5)
nRBC: 0 % (ref 0.0–0.2)

## 2020-05-17 LAB — BASIC METABOLIC PANEL
Anion gap: 11 (ref 5–15)
BUN: 18 mg/dL (ref 8–23)
CO2: 27 mmol/L (ref 22–32)
Calcium: 9.3 mg/dL (ref 8.9–10.3)
Chloride: 101 mmol/L (ref 98–111)
Creatinine, Ser: 0.97 mg/dL (ref 0.44–1.00)
GFR calc Af Amer: >60 mL/min (ref >60)
GFR calc non Af Amer: 56 mL/min — ABNORMAL LOW (ref >60)
Glucose, Bld: 111 mg/dL — ABNORMAL HIGH (ref 70–99)
Potassium: 4.9 mmol/L (ref 3.5–5.1)
Sodium: 139 mmol/L (ref 135–145)

## 2020-05-17 LAB — CBG MONITORING, ED: Glucose-Capillary: 120 mg/dL — ABNORMAL HIGH (ref 70–99)

## 2020-05-17 NOTE — ED Triage Notes (Signed)
Pt called EMS for hypertension. When attached to monitor pt in afib rvr to 160, although asymptomatic. Pt took double her dose of propranolol. On arrival to ED pt still in afib, rate of 120. Pt denies chest pain.

## 2020-05-18 DIAGNOSIS — R002 Palpitations: Secondary | ICD-10-CM | POA: Diagnosis not present

## 2020-05-18 NOTE — Discharge Instructions (Addendum)
Please call Dr. Collene Mares to schedule a follow-up appointment as soon as possible.  I also sent him a message to make him aware that you were seen in the emergency department today.  Please return to the ER if you develop any new or worsening symptoms.  It was a pleasure to meet you.

## 2020-05-18 NOTE — ED Provider Notes (Addendum)
[facility] DEPARTMENT Provider Note   CSN: [id number] Arrival date & time: 05/17/20  1808     History Chief Complaint  Patient presents with  . Hypertension  . Atrial Fibrillation    Rhonda Andrews is a 79 y.o. female.  HPI   Patient is a 79 year old female with a medical history as noted below.  Patient states that she was diagnosed with COVID-19 last month.  Since then she has been experiencing intermittent palpitations.  She does note a history of palpitations in the past and took propanolol for this for nearly 15 years.  She states that she discontinued this medication about 2 years ago.  Last week she began experiencing more recurrent palpitations so she called her cardiologist regarding this.  She states that her PCP was able to restart her propanolol last week.  She states that she noticed her blood pressure was elevated around lunchtime yesterday and she was experiencing palpitations/tachycardia.  She took a dose of propanolol and called EMS.  Per nursing note, patient was found to be in A. fib with RVR at a rate around 160 bpm.  She was asymptomatic at that time.  Patient still only endorses fatigue with me.  No leg swelling, shortness of breath, chest pain.  No dizziness or weakness.  Due to significant boarding and a long wait in the emergency department while in the waiting room this morning she took an additional dose of her propanolol.  Patient denies being anticoagulated.  Cardiologist: Dr. Harl Bowie  Per records, appears that patient wore a previous Holter monitor in September 2010.  This showed no significant arrhythmias.  During her prior admission patient was noted to have a short episode of PSVT.  She was changed from propanolol to atenolol.  She had an increase in her symptoms so she was then switched back to propanolol.  She was placed on diltiazem which caused low blood pressure.  She stopped taking this as well.  Patient also took midodrine  and reported insomnia with this so she stopped taking it.     Past Medical History:  Diagnosis Date  . Anxiety   . 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 Clostridium difficile diarrhea 02/05/2015  . Diabetes mellitus without complication (HCC)    prediabetes  . Dizziness   . 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 Hypertension   . IBS (irritable bowel syndrome)   . Lactose intolerance   . Palpitation   . Palpitations   . Vitamin D deficiency disease 06/23/2019    Patient Active Problem List   Diagnosis Date Noted  . Vitamin D deficiency disease 06/23/2019  . 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 Elevated troponin 06/15/2017  . Essential hypertension   . Nonintractable headache   . Vertigo   . Dizziness 06/10/2017  . Bloating 11/24/2013  . 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 GERD (gastroesophageal reflux disease) 11/03/2013  . Diarrhea 04/28/2012  . ANXIETY 05/06/2009  . IBS 05/06/2009  . CHEST PAIN, ATYPICAL 05/06/2009  . PALPITATIONS 04/30/2009    Past Surgical History:  Procedure Laterality Date  . ABDOMINAL HYSTERECTOMY     ovaries remained  . CHOLECYSTECTOMY    . COLONOSCOPY  2010  . COLONOSCOPY WITH PROPOFOL N/A 05/21/2018   Procedure: COLONOSCOPY WITH PROPOFOL;  Surgeon: Gatha Mayer, MD;  Location: [facility];  Service: Endoscopy;  Laterality: N/A;  with FMT  . 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 KNEE ARTHROSCOPY Right   . Precancerous  Tissue - Nose  February 14-2014     OB History   No obstetric history on file.     Family History  Problem Relation Age of Onset  . Lymphoma Mother   . Colon cancer Mother   . Hypertension Mother   . Migraines Mother   . Other Father        Wagner's Granulermatosis  . Seizures Sister   . Cancer Maternal Grandmother        brain  . Cancer Maternal Grandfather        brain  . Other  Paternal Grandmother        blood clot - DVT following surgery  . Alcohol abuse Paternal Grandfather   . Cirrhosis Paternal Grandfather   . Thyroid disease Sister   . Heart disease Sister     Social History   Tobacco Use  . Smoking status: Never Smoker  . Smokeless tobacco: Never Used  Vaping Use  . Vaping Use: Never used  Substance Use Topics  . Alcohol use: No    Alcohol/week: 0.0 standard drinks  . Drug use: No    Home Medications Prior to Admission medications   Medication Sig Start Date End Date Taking? Authorizing Provider  Calcium Carb-Cholecalciferol (CALCIUM + VITAMIN D3 PO) Take 1 tablet by mouth 2 (two) times daily.    [provider]  Cholecalciferol (VITAMIN D-3) 125 MCG (5000 UT) TABS Take 1 tablet by mouth daily.    [provider]  meclizine (ANTIVERT) 12.5 MG tablet Take 1 tablet (12.5 mg total) by mouth 3 (three) times daily as needed for dizziness. 04/17/20   Ailene Ards, NP  propranolol ER (INDERAL LA) 60 MG 24 hr capsule Take 1 capsule (60 mg total) by mouth daily. 05/03/20   Ailene Ards, NP  vitamin B-12 (CYANOCOBALAMIN) 100 MCG tablet Take 100 mcg by mouth daily.    [provider]    Allergies    Hydrocodone and Prednisone  Review of Systems   Review of Systems  All other systems reviewed and are negative. Ten systems reviewed and are negative for acute change, except as noted in the HPI.    Physical Exam Updated Vital Signs BP (!) 102/92   Pulse (!) 104   Temp 97.8 F (36.6 C) (Oral)   Resp 20   Ht 5\' 4"  (1.626 m)   Wt 60.8 kg   SpO2 100%   BMI 23.00 kg/m   Physical Exam Vitals and nursing note reviewed.  Constitutional:      General: She is not in acute distress.    Appearance: Normal appearance. She is normal weight. She is not ill-appearing, toxic-appearing or diaphoretic.  HENT:     Head: Normocephalic and atraumatic.     Right Ear: External ear normal.     Left Ear: External ear normal.     Nose:  Nose normal.     Mouth/Throat:     Mouth: Mucous membranes are moist.     Pharynx: Oropharynx is clear. No oropharyngeal exudate or posterior oropharyngeal erythema.  Eyes:     Extraocular Movements: Extraocular movements intact.  Cardiovascular:     Rate and Rhythm: Normal rate. Rhythm irregular.     Pulses: Normal pulses.     Heart sounds: Normal heart sounds. No murmur heard.  No friction rub. No gallop.   Pulmonary:     Effort: Pulmonary effort  is normal. No respiratory distress.     Breath sounds: Normal breath sounds. No stridor. No wheezing, rhonchi or rales.  Abdominal:     General: Abdomen is flat.     Palpations: Abdomen is soft.     Tenderness: There is no abdominal tenderness.  Musculoskeletal:        General: No tenderness. Normal range of motion.     Cervical back: Normal range of motion and neck supple. No tenderness.     Right lower leg: No edema.     Left lower leg: No edema.     Comments: No leg swelling.  No calf pain.  Skin:    General: Skin is warm and dry.  Neurological:     General: No focal deficit present.     Mental Status: She is alert and oriented to person, place, and time.  Psychiatric:        Mood and Affect: Mood normal.        Behavior: Behavior normal.     ED Results / Procedures / Treatments   Labs (all labs ordered are listed, but only abnormal results are displayed) Labs Reviewed  BASIC METABOLIC PANEL - Abnormal; Notable for the following components:      Result Value   Glucose, Bld 111 (*)    GFR calc non Af Amer 56 (*)    All other components within normal limits  CBC - Abnormal; Notable for the following components:   RBC 5.41 (*)    Hemoglobin 15.8 (*)    HCT 49.4 (*)    All other components within normal limits  CBG MONITORING, ED - Abnormal; Notable for the following components:   Glucose-Capillary 120 (*)    All other components within normal limits   EKG EKG Interpretation  Date/Time:  Thursday May 17 2020  18:10:34 EDT Ventricular Rate:  119 PR Interval:  172 QRS Duration: 114 QT Interval:  304 QTC Calculation: 427 R Axis:   -14 Text Interpretation: Sinus tachycardia Minimal voltage criteria for LVH, may be normal variant ( Cornell product ) Septal infarct , age undetermined Abnormal ECG Confirmed by Pattricia Boss [phone]) on 05/18/2020 12:08:22 PM  Radiology DG Chest 2 View  Result Date: 05/17/2020 CLINICAL DATA:  Atrial fibrillation, hypertension EXAM: CHEST - 2 VIEW COMPARISON:  Radiograph 10/08/2014 FINDINGS: Mild hyperinflation and minimally coarsened reticular changes are similar to comparison. No consolidation, features of edema, pneumothorax, or effusion. The cardiomediastinal contours are unremarkable. No acute osseous or soft tissue abnormality. Degenerative changes are present in the imaged spine and shoulders. Surgical clips present in the right upper quadrant. IMPRESSION: Chronic hyperinflation and mildly coarsened interstitial change. No acute cardiopulmonary abnormality. Electronically Signed   By: Lovena Le M.D.   On: 05/17/2020 18:57   Procedures Procedures (including critical care time)  Medications Ordered in ED Medications - No data to display  ED Course  I have reviewed the triage vital signs and the nursing notes.  Pertinent labs & imaging results that were available during my care of the patient were reviewed by me and considered in my medical decision making (see chart for details).    MDM Rules/Calculators/A&P                          Pt is a 79 y.o. female that presents with a history, physical exam, and ED Clinical Course as noted above.   Patient presents patient notes a significant history of palpitations and has  been taking propanolol for many years.  She is followed by Dr. Harl Bowie with cardiology.  Patient was initially seen by EMS yesterday and found to be in A. fib with RVR.  She was brought to the ED and repeat ECG showed sinus tachycardia.  She took a  dose of propanolol yesterday afternoon as well as another dose this morning while waiting in the lobby.  Patient is asymptomatic at this time and has been otherwise asymptomatic besides her palpitations.  No chest pain or shortness of breath.  No dizziness or lightheadedness.  No syncope.  She recently got over a COVID-19 infection and does note some continued fatigue.  Does not believe this is new since experiencing her bouts of palpitations for the past week.  2 additional ECGs were obtained since patient has been roomed.  Both are showing a sinus arrhythmia but none are showing atrial fibrillation.  Patient was discussed with and evaluated by my attending physician as well.  We will additionally send a message to Dr. Harl Bowie, her cardiologist, to make him aware of her visit today. Dr. Harl Bowie contacted me and noted that he also evaluated her EKGs.  He states that they are showing sinus rhythm as well as PACs.  Does not feel they are worrisome at this time.  Recommended that she continue her propanolol. He notes that she can take a 1/2 dose (30 mg) TID prn. This was discussed with the pt and she voiced understanding she has a f/u appointment with him next week.   Patient is hemodynamically stable and in NAD at the time of d/c. Evaluation does not show pathology that would require ongoing emergent intervention or inpatient treatment. I explained the diagnosis to the patient. Patient is comfortable with above plan and is stable for discharge at this time. All questions were answered prior to disposition. Strict return precautions for returning to the ED were discussed. Encouraged follow up with PCP.    An After Visit Summary was printed and given to the patient.  Patient discharged to home/self care.  Condition at discharge: Stable  Note: Portions of this report may have been transcribed using voice recognition software. Every effort was made to ensure accuracy; however, inadvertent computerized  transcription errors may be present.   Final Clinical Impression(s) / ED Diagnoses Final diagnoses:  Palpitations   Rx / DC Orders ED Discharge Orders    None       Rayna Sexton, PA-C 05/18/20 1441    Rayna Sexton, PA-C 05/18/20 1449    Blanchie Dessert, MD 05/20/20 [phone]

## 2020-05-23 ENCOUNTER — Ambulatory Visit: Payer: Medicare Other

## 2020-05-24 ENCOUNTER — Encounter: Payer: Self-pay | Admitting: Family Medicine

## 2020-05-24 ENCOUNTER — Ambulatory Visit (INDEPENDENT_AMBULATORY_CARE_PROVIDER_SITE_OTHER): Payer: Medicare Other | Admitting: Family Medicine

## 2020-05-24 VITALS — BP 122/80 | HR 65 | Ht 64.0 in | Wt 135.6 lb

## 2020-05-24 DIAGNOSIS — I10 Essential (primary) hypertension: Secondary | ICD-10-CM | POA: Diagnosis not present

## 2020-05-24 DIAGNOSIS — R002 Palpitations: Secondary | ICD-10-CM | POA: Diagnosis not present

## 2020-05-24 MED ORDER — PROPRANOLOL HCL ER 60 MG PO CP24: 60.0000 mg | ORAL_CAPSULE | Freq: Every day | ORAL | 6 refills | Status: DC

## 2020-05-24 NOTE — Patient Instructions (Addendum)
Medication Instructions:  Continue all current medications.  Labwork: none  Testing/Procedures: none  Follow-Up: 3 months   Any Other Special Instructions Will Be Listed Below (If Applicable).  If you need a refill on your cardiac medications before your next appointment, please call your pharmacy.  

## 2020-05-24 NOTE — Progress Notes (Signed)
Cardiology Office Note  Date: 05/24/2020   ID: [name], [name] 1941-02-01, MRN [id number]  PCP:  Doree Albee, MD  Cardiologist:  Carlyle Dolly, MD Electrophysiologist:  None   Chief Complaint: Palpitations  History of Present Illness: Rhonda Andrews is a 79 y.o. female with a history of palpitations, dizziness.    Last visit with Dr. Harl Bowie on 05/09/2020 via telemedicine visit.  Previous Holter monitor September 2010 showed no significant arrhythmias.  During prior admission did have short episode of PSVT.  Changed from propranolol to atenolol.  Secondary to change she had increased symptoms and she changed herself back to propranolol.  She reported short acting propranolol did not work well and preferred long-acting formulation.  Diltiazem caused low blood pressure at home.  She stopped taking.  Tried midodrine at home for 2 weeks reported insomnia and stopped taking it.  Was able to get off propranolol last at up previous without symptoms.  She did recently had a Covid infection and since that time has been having recurrent palpitations.  She restarted propranolol long-acting.  She was splitting the individual capsule into 2 separate doses.  Several other doses of meds had been tried which she did not tolerate.  Thus, she continued on current regimen of splitting the capsule into 2 separate doses.  She presented to [facility] emergency department on 05/17/2020 with complaints of palpitations and hypertension.  Symptoms had started the previous night.  She waited a long time in the emergency room.  She had taken her propranolol while waiting.  Heart rate was between 60 and 80 on the monitor in the emergency department.  Irregular heart rhythm but with multiple EKGs.  It appeared to be sinus arrhythmia with frequent PACs per provider's note.  No definitive evidence of atrial fibrillation.  She reported symptoms had worsened since she had Covid the previous month.  She is  here today status post hospital follow-up.  States she continues to have issues with her palpitations since she had the Covid virus in August.  She is concerned about the dosing interval of her propranolol extended release.  He states he is taking a half a capsule twice per day.  She states at some point during the day her blood pressure seems to drop and she has some dizziness.  She is concerned the dosing interval may be too close together.  She states she usually takes it about 12 hours apart.  States she cannot tolerate taking a whole capsule.  Otherwise she denies any recent issues.  She states she had no issues with palpitations prior to the Covid infection.  She states the Covid infection seems to have set her palpitations off.  She is normotensive today with a blood pressure of 122/80.  Heart rate is 65.  He denies any current orthostatic symptoms, CVA or TIA-like symptoms, palpitations, CVA or TIA-like symptoms, PND, orthopnea.  No claudication-like symptoms, DVT or PE-like symptoms, or lower extremity edema.  Past Medical History:  Diagnosis Date  . Anxiety   . 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 Clostridium difficile diarrhea 02/05/2015  . Diabetes mellitus without complication (HCC)    prediabetes  . Dizziness   . 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 Hypertension   . IBS (irritable bowel syndrome)   . Lactose intolerance   . Palpitation   . Palpitations   . Vitamin D deficiency disease 06/23/2019    Past Surgical History:  Procedure Laterality Date  . ABDOMINAL HYSTERECTOMY  ovaries remained  . CHOLECYSTECTOMY    . COLONOSCOPY  2010  . COLONOSCOPY WITH PROPOFOL N/A 05/21/2018   Procedure: COLONOSCOPY WITH PROPOFOL;  Surgeon: Gatha Mayer, MD;  Location: [facility];  Service: Endoscopy;  Laterality: N/A;  with FMT  . 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 KNEE ARTHROSCOPY  Right   . Precancerous  Tissue - Nose  February 14-2014    Current Outpatient Medications  Medication Sig Dispense Refill  . Calcium Carb-Cholecalciferol (CALCIUM + VITAMIN D3 PO) Take 1 tablet by mouth 2 (two) times daily.    . Cholecalciferol (VITAMIN D-3) 125 MCG (5000 UT) TABS Take 1 tablet by mouth daily.    . meclizine (ANTIVERT) 12.5 MG tablet Take 1 tablet (12.5 mg total) by mouth 3 (three) times daily as needed for dizziness. 30 tablet 2  . propranolol ER (INDERAL LA) 60 MG 24 hr capsule Take 1 capsule (60 mg total) by mouth daily. (Patient taking differently: Take 60 mg by mouth daily. Takes at least twice a day.) 30 capsule 3   No current facility-administered medications for this visit.   Allergies:  Hydrocodone and Prednisone   Social History: The patient  reports that she has never smoked. She has never used smokeless tobacco. She reports that she does not drink alcohol and does not use drugs.   Family History: The patient's family history includes Alcohol abuse in her paternal grandfather; Cancer in her maternal grandfather and maternal grandmother; Cirrhosis in her paternal grandfather; Colon cancer in her mother; Heart disease in her sister; Hypertension in her mother; Lymphoma in her mother; Migraines in her mother; Other in her father and paternal grandmother; Seizures in her sister; Thyroid disease in her sister.   ROS:  Please see the history of present illness. Otherwise, complete review of systems is positive for none.  All other systems are reviewed and negative.   Physical Exam: VS:  BP 122/80   Pulse 65   Ht 5\' 4"  (1.626 m)   Wt 135 lb 9.6 oz (61.5 kg)   SpO2 99%   BMI 23.28 kg/m , BMI Body mass index is 23.28 kg/m.  Wt Readings from Last 3 Encounters:  05/24/20 135 lb 9.6 oz (61.5 kg)  05/17/20 134 lb (60.8 kg)  05/09/20 135 lb (61.2 kg)    General: Patient appears comfortable at rest. Neck: Supple, no elevated JVP or carotid bruits, no  thyromegaly. Lungs: Clear to auscultation, nonlabored breathing at rest. Cardiac: Regular rate and rhythm, no S3 or significant systolic murmur, no pericardial rub. Extremities: No pitting edema, distal pulses 2+. Skin: Warm and dry. Musculoskeletal: No kyphosis. Neuropsychiatric: Alert and oriented x3, affect grossly appropriate.  ECG:  [facility] on 05/17/2020 showed sinus tachycardia rate of 119 minimal voltage criteria for LVH, septal infarct, age undetermined  Recent Labwork: 06/23/2019: ALT 15; AST 17 05/17/2020: BUN 18; Creatinine, Ser 0.97; Hemoglobin 15.8; Platelets 275; Potassium 4.9; Sodium 139  No results found for: CHOL, TRIG, HDL, CHOLHDL, VLDL, LDLCALC, LDLDIRECT  Other Studies Reviewed Today:   Assessment and Plan:  1. Palpitations   2. Essential hypertension    1. Palpitations Recent increased issues with palpitations after Covid virus in August.  Patient states she has been trying to adjust her propranolol dosage to accommodate her palpitations and fluctuating blood pressures.  She takes propranolol extended release capsules.  She has been taking 1/2  capsule in the daytime and one half at night for the most part.  Sometimes she has been taking 1 whole capsule in the daytime and 1 at night.  She states at some point during the day the doses seem to overlap and she has low blood pressures and feels dizzy.  Advised her to stretch the dosing interval out to approximately 14 hours instead of 12-hour interval to see if this might help.  We talked about possibly taking the first dose approximately 6 AM in the evening a dose approximately 8 PM to see if this would reduce the overlapping effect of the medications causing her blood pressure to decrease and feel dizzy.  She is asking for refill on propranolol extended release 60 mg.  We will send refill to pharmacy.  2. Essential hypertension States blood pressure is well controlled for the most part except for when she takes  her doses of metoprolol to close together.  She states she has been taking the doses approximately 12 hours apart but sometimes her blood pressure drops as a result and she feels dizzy.  We discussed above in #1 the dosing interval she could try to see if this would help mitigate some of the symptoms.  She states she is going to try this and see if this will help improve the low blood pressures.  Medication Adjustments/Labs and Tests Ordered: Current medicines are reviewed at length with the patient today.  Concerns regarding medicines are outlined above.   Disposition: Follow-up with Dr. Harl Bowie or APP 3 months  Signed, Levell July, NP 05/24/2020 3:49 PM    [facility] [place], [place], [place] [address] Phone: [phone]; Fax: [phone]

## 2020-06-12 ENCOUNTER — Other Ambulatory Visit: Payer: Self-pay

## 2020-06-12 ENCOUNTER — Ambulatory Visit (INDEPENDENT_AMBULATORY_CARE_PROVIDER_SITE_OTHER): Payer: Medicare Other | Admitting: Internal Medicine

## 2020-06-12 ENCOUNTER — Encounter (INDEPENDENT_AMBULATORY_CARE_PROVIDER_SITE_OTHER): Payer: Self-pay | Admitting: Internal Medicine

## 2020-06-12 DIAGNOSIS — Z8619 Personal history of other infectious and parasitic diseases: Secondary | ICD-10-CM | POA: Diagnosis not present

## 2020-06-12 NOTE — Patient Instructions (Addendum)
Please call office if you experience diarrhea or abdominal pain.

## 2020-06-12 NOTE — Progress Notes (Signed)
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  Database and subjective:  Patient is 79 year old Caucasian female who has several year history of diarrhea felt to be due to IBS.  About 3 years ago she was diagnosed with C. difficile colitis and had to be treated multiple times.  She did not have long-lasting response.  She was therefore referred to Dr. Agapito Games for fecal transplant which she underwent in September 2019 or 2 years ago.  She has not had any problems since then. Patient says she is not needed any antibiotics since she was last seen.  She is having 1 formed stool daily.  Her appetite is good.  She denies abdominal pain.  She says she walks 2 miles at least 5 times a week.  In addition to this walks she does house and yard work.  She says she received Covid vaccine in March 2021 and developed tingling in all 4 extremities and rise in blood pressure.  She decided not to get the second dose.  She developed Covid infection about 2 months ago.  She says her only symptom was plantar fasciitis lasting for 5 days.  She did not have fever or breathing difficulties.  Current Medications: Outpatient Encounter Medications as of 06/12/2020  Medication Sig  . Calcium Carb-Cholecalciferol (CALCIUM + VITAMIN D3 PO) Take 1 tablet by mouth 2 (two) times daily.  . Cholecalciferol (VITAMIN D-3) 125 MCG (5000 UT) TABS Take 1 tablet by mouth daily.  . meclizine (ANTIVERT) 12.5 MG tablet Take 1 tablet (12.5 mg total) by mouth 3 (three) times daily as needed for dizziness.  . vitamin B-12 (CYANOCOBALAMIN) 1000 MCG tablet Take 1,000 mcg by mouth daily.  . propranolol ER (INDERAL LA) 60 MG 24 hr capsule Take 1 capsule (60 mg total) by mouth daily. (Patient not taking: Reported on 06/12/2020)   No facility-administered encounter medications on file as of 06/12/2020.    Objective: Blood pressure 134/76, pulse 80, temperature 98.1 F (36.7 C),  temperature source Oral, height 5' 4" (1.626 m), weight 137 lb (62.1 kg). Patient is alert and in no acute distress. Patient is wearing a mask. Conjunctiva is pink. Sclera is nonicteric Oropharyngeal mucosa is normal. No neck masses or thyromegaly noted. Cardiac exam with regular rhythm normal S1 and S2. No murmur or gallop noted. Lungs are clear to auscultation. Abdomen is symmetrical.  Bowel sounds are normal.  On palpation abdomen is soft and nontender with organomegaly or masses. No LE edema or clubbing noted.  Labs/studies Results:  CBC Latest Ref Rng & Units 05/17/2020 06/15/2017 06/19/2015  WBC 4.0 - 10.5 K/uL 9.2 6.9 7.4  Hemoglobin 12.0 - 15.0 g/dL 15.8(H) 15.0 15.1(H)  Hematocrit 36 - 46 % 49.4(H) 45.4 44.3  Platelets 150 - 400 K/uL 275 209 244    CMP Latest Ref Rng & Units 05/17/2020 06/23/2019 08/17/2017  Glucose 70 - 99 mg/dL 111(H) 81 111  BUN 8 - 23 mg/dL _0 Creatinine 0.44 - 1.00 mg/dL 0.97 0.85 0.94(H)  Sodium 135 - 145 mmol/L 139 142 140  Potassium 3.5 - 5.1 mmol/L 4.9 4.5 4.2  Chloride 98 - 111 mmol/L 101 104 102  CO2 22 - 32 mmol/L _1 Calcium 8.9 - 10.3 mg/dL 9.3 9.7 9.2  Total Protein 6.1 - 8.1 g/dL - 6.5 -  Total Bilirubin 0.2 - 1.2 mg/dL - 0.5 -  Alkaline Phos 33 - 130 U/L - - -  AST  10 - 35 U/L - 17 -  ALT 6 - 29 U/L - 15 -    Hepatic Function Latest Ref Rng & Units 06/23/2019 06/19/2015 01/30/2015  Total Protein 6.1 - 8.1 g/dL 6.5 6.3 5.8(L)  Albumin 3.6 - 5.1 g/dL - 4.0 3.7  AST 10 - 35 U/L 17 16 17  ALT 6 - 29 U/L 15 10 14  Alk Phosphatase 33 - 130 U/L - 65 61  Total Bilirubin 0.2 - 1.2 mg/dL 0.5 0.6 0.4  Bilirubin, Direct 0.0 - 0.3 mg/dL - - -     Assessment:  #1.  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 Carl Gassner).  #2.  History of IBS.  She has not had any symptoms lately.   Plan:  Patient will call if she has bowel issues. Patient requests to come  back for yearly visit.  Will do so.      

## 2020-06-27 ENCOUNTER — Ambulatory Visit (INDEPENDENT_AMBULATORY_CARE_PROVIDER_SITE_OTHER): Payer: Medicare Other | Admitting: Nurse Practitioner

## 2020-07-02 ENCOUNTER — Ambulatory Visit (INDEPENDENT_AMBULATORY_CARE_PROVIDER_SITE_OTHER): Payer: Medicare Other | Admitting: Nurse Practitioner

## 2020-07-02 ENCOUNTER — Other Ambulatory Visit: Payer: Self-pay

## 2020-07-02 ENCOUNTER — Encounter (INDEPENDENT_AMBULATORY_CARE_PROVIDER_SITE_OTHER): Payer: Self-pay | Admitting: Nurse Practitioner

## 2020-07-02 VITALS — BP 148/82 | HR 79 | Temp 96.9°F | Ht 64.0 in | Wt 137.0 lb

## 2020-07-02 DIAGNOSIS — R739 Hyperglycemia, unspecified: Secondary | ICD-10-CM

## 2020-07-02 DIAGNOSIS — E559 Vitamin D deficiency, unspecified: Secondary | ICD-10-CM | POA: Diagnosis not present

## 2020-07-02 DIAGNOSIS — Z131 Encounter for screening for diabetes mellitus: Secondary | ICD-10-CM | POA: Diagnosis not present

## 2020-07-02 DIAGNOSIS — Z13220 Encounter for screening for lipoid disorders: Secondary | ICD-10-CM | POA: Diagnosis not present

## 2020-07-02 DIAGNOSIS — I10 Essential (primary) hypertension: Secondary | ICD-10-CM

## 2020-07-02 DIAGNOSIS — R002 Palpitations: Secondary | ICD-10-CM

## 2020-07-02 NOTE — Progress Notes (Signed)
   Subjective:  Patient ID: Rhonda Andrews, female    DOB: 02/04/1941  Age: 79 y.o. MRN: [id number]  CC:  Chief Complaint  Patient presents with  . Follow-up    has not been feeling well  . Hypertension  . Other    Vitamin D Deficiency      HPI  This patient arrives today for the above.  She has a history of hypertension and is currently on propanolol for treatment of this as well as treatment of cardiac palpitations. She continues on her vitamin D3 supplement is 5000 IUs daily. Its been about a year since blood work was checked to see serum vitamin D level. She also has not had lipid panel or TSH screening completed in the recent past. She was seen in the ER about 1 month ago and glucose was slightly elevated on metabolic panel at that time. Today, she reports feeling overall well. She is still recovering from COVID-19 infection and has been experiencing a bit of stress related to caring for her husband who also is recovering from COVID-19 infection earlier this year.  Past Medical History:  Diagnosis Date  . Anxiety   . 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 Clostridium difficile diarrhea 02/05/2015  . Diabetes mellitus without complication (HCC)    prediabetes  . Dizziness   . 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 Hypertension   . IBS (irritable bowel syndrome)   . Lactose intolerance   . Palpitation   . Palpitations   . Vitamin D deficiency disease 06/23/2019      Family History  Problem Relation Age of Onset  . Lymphoma Mother   . Colon cancer Mother   . Hypertension Mother   . Migraines Mother   . Other Father        Wagner's Granulermatosis  . Seizures Sister   . Cancer Maternal Grandmother        brain  . Cancer Maternal Grandfather        brain  . Other Paternal Grandmother        blood clot - DVT following surgery  . Alcohol abuse Paternal Grandfather   . Cirrhosis Paternal Grandfather   . Thyroid disease Sister   . Heart disease Sister     Social  History   Social History Narrative   Married. Has a daughter. 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   Eats all food groups and has a great appetite.    Walks in house daily in her house, 1.5 miles a day.   Right handed   Quit drinking caffeine 15 years ago   No EtOH/tobacco   Social History   Tobacco Use  . Smoking status: Never Smoker  . Smokeless tobacco: Never Used  Substance Use Topics  . Alcohol use: No    Alcohol/week: 0.0 standard drinks     Current Meds  Medication Sig  . Calcium Carb-Cholecalciferol (CALCIUM + VITAMIN D3 PO) Take 1 tablet by mouth 2 (two) times daily.  . Cholecalciferol (VITAMIN D-3) 125 MCG (5000 UT) TABS Take 1 tablet by mouth daily.  . meclizine (ANTIVERT) 12.5 MG tablet Take 1 tablet (12.5 mg total) by mouth 3 (three) times daily as needed for dizziness.  . propranolol (INDERAL) 60 MG tablet Take 60 mg by mouth as needed.  . vitamin B-12 (CYANOCOBALAMIN) 1000 MCG tablet Take 1,000 mcg by mouth daily.    ROS:  Review of Systems    Constitutional: Negative.   Eyes: Negative.   Respiratory: Negative.   Cardiovascular: Positive for palpitations (intermittent). Negative for chest pain.  Neurological: Negative.      Objective:   Today's Vitals: BP (!) 148/82   Pulse 79   Temp (!) 96.9 F (36.1 C) (Temporal)   Ht 5' 4" (1.626 m)   Wt 137 lb (62.1 kg)   SpO2 95%   BMI 23.52 kg/m  Vitals with BMI 07/02/2020 06/12/2020 05/24/2020  Height 5' 4" 5' 4" 5' 4"  Weight 137 lbs 137 lbs 135 lbs 10 oz  BMI 23.5 37.0 48.88  Systolic 916 945 038  Diastolic 82 76 80  Pulse 79 80 65     Physical Exam Vitals reviewed.  Constitutional:      General: She is not in acute distress.    Appearance: Normal appearance.  HENT:     Head: Normocephalic and atraumatic.  Neck:     Vascular: No carotid bruit.  Cardiovascular:     Rate and Rhythm: Normal rate and regular rhythm.     Pulses:  Normal pulses.     Heart sounds: Normal heart sounds.  Pulmonary:     Effort: Pulmonary effort is normal.     Breath sounds: Normal breath sounds.  Skin:    General: Skin is warm and dry.  Neurological:     General: No focal deficit present.     Mental Status: She is alert and oriented to person, place, and time.  Psychiatric:        Mood and Affect: Mood normal.        Behavior: Behavior normal.        Judgment: Judgment normal.          Assessment and Plan   1. Essential hypertension   2. Vitamin D deficiency disease   3. Screening, lipid   4. Palpitations   5. Screening for diabetes mellitus   6. Hyperglycemia      Plan: 1. Blood pressure a bit above goal today, however per shared decision making we will not change or add medications for treatment of hypertension. She will focus on trying to get back to a healthier lifestyle. If blood pressure remains elevated may consider adding additional agent. 2.-6. We will check blood work today for further evaluation. She will continue on her current supplements.   Tests ordered Orders Placed This Encounter  Procedures  . CMP with eGFR([facility])  . Vitamin D, 25-hydroxy  . Lipid Panel  . TSH  . Hemoglobin A1c      No orders of the defined types were placed in this encounter.   Patient to follow-up in 3 months or sooner as needed.  Ailene Ards, NP

## 2020-07-03 ENCOUNTER — Telehealth (INDEPENDENT_AMBULATORY_CARE_PROVIDER_SITE_OTHER): Payer: Self-pay | Admitting: Nurse Practitioner

## 2020-07-03 DIAGNOSIS — E875 Hyperkalemia: Secondary | ICD-10-CM

## 2020-07-03 LAB — COMPLETE METABOLIC PANEL WITH GFR
AG Ratio: 1.8 (calc) (ref 1.0–2.5)
ALT: 15 U/L (ref 6–29)
AST: 19 U/L (ref 10–35)
Albumin: 4.2 g/dL (ref 3.6–5.1)
Alkaline phosphatase (APISO): 73 U/L (ref 37–153)
BUN: 23 mg/dL (ref 7–25)
CO2: 31 mmol/L (ref 20–32)
Calcium: 10 mg/dL (ref 8.6–10.4)
Chloride: 104 mmol/L (ref 98–110)
Creat: 0.9 mg/dL (ref 0.60–0.93)
GFR, Est African American: 71 mL/min/{1.73_m2} (ref >=60)
GFR, Est Non African American: 62 mL/min/{1.73_m2} (ref >=60)
Globulin: 2.3 g/dL (calc) (ref 1.9–3.7)
Glucose, Bld: 101 mg/dL — ABNORMAL HIGH (ref 65–99)
Potassium: 5.4 mmol/L — ABNORMAL HIGH (ref 3.5–5.3)
Sodium: 141 mmol/L (ref 135–146)
Total Bilirubin: 0.7 mg/dL (ref 0.2–1.2)
Total Protein: 6.5 g/dL (ref 6.1–8.1)

## 2020-07-03 LAB — LIPID PANEL
Cholesterol: 219 mg/dL — ABNORMAL HIGH (ref <200)
HDL: 57 mg/dL (ref >=50)
LDL Cholesterol (Calc): 136 mg/dL (calc) — ABNORMAL HIGH
Non-HDL Cholesterol (Calc): 162 mg/dL (calc) — ABNORMAL HIGH (ref <130)
Total CHOL/HDL Ratio: 3.8 (calc) (ref <5.0)
Triglycerides: 137 mg/dL (ref <150)

## 2020-07-03 LAB — VITAMIN D 25 HYDROXY (VIT D DEFICIENCY, FRACTURES): Vit D, 25-Hydroxy: 41 ng/mL (ref 30–100)

## 2020-07-03 LAB — TSH: TSH: 2.1 mIU/L (ref 0.40–4.50)

## 2020-07-03 LAB — HEMOGLOBIN A1C
Hgb A1c MFr Bld: 5.6 % of total Hgb (ref <5.7)
Mean Plasma Glucose: 114 (calc)
eAG (mmol/L): 6.3 (calc)

## 2020-07-03 NOTE — Telephone Encounter (Signed)
Please call this patient and get her scheduled for lab follow-up within the next 7-10 days.  This is due to her blood work results from yesterday.  I have sent her a note in her MyChart explaining this so please direct her to her MyChart if she has any questions.  I will place orders for repeat blood work in the system now.  Thank you.

## 2020-07-11 ENCOUNTER — Other Ambulatory Visit (INDEPENDENT_AMBULATORY_CARE_PROVIDER_SITE_OTHER): Payer: Medicare Other

## 2020-07-12 ENCOUNTER — Ambulatory Visit: Payer: Medicare Other | Admitting: Family Medicine

## 2020-07-18 ENCOUNTER — Telehealth: Payer: Self-pay | Admitting: Cardiology

## 2020-07-18 NOTE — Telephone Encounter (Signed)
New message    Patient wanted earlier appt she believes her  bp is uncontrolled ( she is not keeping track of it) one day its up and then its down, if she takes the medicine to control the bp then her heart rate drops.    Please call her in 45 minutes she is getting her hair done.

## 2020-07-18 NOTE — Telephone Encounter (Signed)
Pt says propranolol 60 mg a few times weekly when BP goes up and HR drops in 50s and makes her feel weak/tired - pt denies chest pain/SOB - says she is dizzy but this is not new or worse than usual - pt scheduled with Katina Dung, NP Monday 11/22

## 2020-07-19 ENCOUNTER — Other Ambulatory Visit (INDEPENDENT_AMBULATORY_CARE_PROVIDER_SITE_OTHER): Payer: Medicare Other

## 2020-07-19 ENCOUNTER — Encounter (INDEPENDENT_AMBULATORY_CARE_PROVIDER_SITE_OTHER): Payer: Self-pay

## 2020-07-22 NOTE — Progress Notes (Signed)
Cardiology Office Note  Date: 07/23/2020   ID: Rhonda Andrews, Rhonda Andrews 1940/09/25, MRN [id number]  PCP:  Doree Albee, MD  Cardiologist:  Carlyle Dolly, MD Electrophysiologist:  None   Chief Complaint: Palpitations  History of Present Illness: Rhonda Andrews is a 79 y.o. female with a history of palpitations, dizziness.    Last visit with Dr. Harl Bowie on 05/09/2020 via telemedicine visit.  Previous Holter monitor September 2010 showed no significant arrhythmias.  During prior admission did have short episode of PSVT.  Changed from propranolol to atenolol.  Secondary to change she had increased symptoms and she changed herself back to propranolol.  She reported short acting propranolol did not work well and preferred long-acting formulation.  Diltiazem caused low blood pressure at home.  She stopped taking.  Tried midodrine at home for 2 weeks reported insomnia and stopped taking it.  Was able to get off propranolol last at up previous without symptoms.  She did recently had a Covid infection and since that time has been having recurrent palpitations.  She restarted propranolol long-acting.  She was splitting the individual capsule into 2 separate doses.  Several other doses of meds had been tried which she did not tolerate.  Thus, she continued on current regimen of splitting the capsule into 2 separate doses.  She presented to [facility] emergency department on 05/17/2020 with complaints of palpitations and hypertension.  Symptoms had started the previous night.  She waited a long time in the emergency room.  She had taken her propranolol while waiting.  Heart rate was between 60 and 80 on the monitor in the emergency department.  Irregular heart rhythm but with multiple EKGs.  It appeared to be sinus arrhythmia with frequent PACs per provider's note.  No definitive evidence of atrial fibrillation.  She reported symptoms had worsened since she had Covid the previous month.  She  presented last visit on 05/24/2020 status post hospital follow-up.  Stated she continued to have issues with her palpitations since she had the Covid virus in August.  She was concerned about the dosing interval of her propranolol extended release.  Stated she was taking a half a capsule twice per day.  She stated at some point during the day her blood pressure seems to drop and she has some dizziness.  She was concerned the dosing interval may be too close together.  She states she usually takes it about 12 hours apart.  Reported she could not  tolerate taking a whole capsule.  Otherwise she denied any recent issues.  She stated she had no issues with palpitations prior to the Covid infection.  She states the Covid infection seems to have set her palpitations off.  She was normotensive with a blood pressure of 122/80.  Heart rate is 65.  Do not any current orthostatic symptoms, CVA or TIA-like symptoms, palpitations, CVA or TIA-like symptoms, PND, orthopnea.  No claudication-like symptoms, DVT or PE-like symptoms, or lower extremity edema.  She is here with complaints of some labile blood pressures.  She states she is taking care of her husband who is having health issues lately due to Anaconda syndrome and problems with memory loss and possible dementia related behavior.  She states the stress of taking care of him may be related to labile blood pressures.  She states when he was in rehab she had no problems with her blood pressure.  She denies any other issues.  No chest pain, tightness, or other anginal  symptoms.  No orthostatic symptoms, CVA or TIA-like symptoms.  Admits to some gastric issues and some weight loss.  States she has some discomfort in her left epigastric/inframammary area when laying on her left side.    Past Medical History:  Diagnosis Date  . Anxiety   . 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 Clostridium difficile diarrhea 02/05/2015  . Diabetes mellitus without  complication (HCC)    prediabetes  . Dizziness   . 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 Hypertension   . IBS (irritable bowel syndrome)   . Lactose intolerance   . Palpitation   . Palpitations   . Vitamin D deficiency disease 06/23/2019    Past Surgical History:  Procedure Laterality Date  . ABDOMINAL HYSTERECTOMY     ovaries remained  . CHOLECYSTECTOMY    . COLONOSCOPY  2010  . COLONOSCOPY WITH PROPOFOL N/A 05/21/2018   Procedure: COLONOSCOPY WITH PROPOFOL;  Surgeon: Gatha Mayer, MD;  Location: [facility] ENDOSCOPY;  Service: Endoscopy;  Laterality: N/A;  with FMT  . 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 KNEE ARTHROSCOPY Right   . Precancerous  Tissue - Nose  February 14-2014    Current Outpatient Medications  Medication Sig Dispense Refill  . Calcium Carb-Cholecalciferol (CALCIUM + VITAMIN D3 PO) Take 1 tablet by mouth 2 (two) times daily.    . Cholecalciferol (VITAMIN D-3) 125 MCG (5000 UT) TABS Take 1 tablet by mouth daily.    . meclizine (ANTIVERT) 12.5 MG tablet Take 1 tablet (12.5 mg total) by mouth 3 (three) times daily as needed for dizziness. 30 tablet 2  . propranolol (INDERAL) 60 MG tablet Take 60 mg by mouth as needed.    . vitamin B-12 (CYANOCOBALAMIN) 1000 MCG tablet Take 1,000 mcg by mouth daily.     No current facility-administered medications for this visit.   Allergies:  Hydrocodone and Prednisone   Social History: The patient  reports that she has never smoked. She has never used smokeless tobacco. She reports that she does not drink alcohol and does not use drugs.   Family History: The patient's family history includes Alcohol abuse in her paternal grandfather; Cancer in her maternal grandfather and maternal grandmother; Cirrhosis in her paternal grandfather; Colon cancer in her mother; Heart disease in her sister; Hypertension in her mother; Lymphoma in her mother; Migraines  in her mother; Other in her father and paternal grandmother; Seizures in her sister; Thyroid disease in her sister.   ROS:  Please see the history of present illness. Otherwise, complete review of systems is positive for none.  All other systems are reviewed and negative.   Physical Exam: VS:  BP (!) 148/80   Pulse 76   Ht 5\' 4"  (1.626 m)   Wt 134 lb 3.2 oz (60.9 kg)   SpO2 96%   BMI 23.04 kg/m , BMI Body mass index is 23.04 kg/m.  Wt Readings from Last 3 Encounters:  07/23/20 134 lb 3.2 oz (60.9 kg)  07/02/20 137 lb (62.1 kg)  06/12/20 137 lb (62.1 kg)    General: Patient appears comfortable at rest. Neck: Supple, no elevated JVP or carotid bruits, no thyromegaly. Lungs: Clear to auscultation, nonlabored breathing at rest. Cardiac: Regular rate and rhythm, no S3 or significant systolic murmur, no pericardial rub. Extremities: No pitting edema, distal pulses 2+. Skin: Warm and dry. Musculoskeletal: No kyphosis. Neuropsychiatric: Alert and oriented x3, affect  grossly appropriate.  ECG:  EKG at [facility] on 05/17/2020 showed sinus tachycardia rate of 119 minimal voltage criteria for LVH, septal infarct, age undetermined  Recent Labwork: 05/17/2020: Hemoglobin 15.8; Platelets 275 07/02/2020: ALT 15; AST 19; BUN 23; Creat 0.90; Potassium 5.4; Sodium 141; TSH 2.10     Component Value Date/Time   CHOL 219 (H) 07/02/2020 1032   TRIG 137 07/02/2020 1032   HDL 57 07/02/2020 1032   CHOLHDL 3.8 07/02/2020 1032   LDLCALC 136 (H) 07/02/2020 1032    Other Studies Reviewed Today:  Echocardiogram 06/16/2017 Study Conclusions   - Left ventricle: The cavity size was normal. Wall thickness was  increased in a pattern of mild LVH. Systolic function was  vigorous. The estimated ejection fraction was in the range of 65%  to 70%. Wall motion was normal; there were no regional wall  motion abnormalities. Doppler parameters are consistent with  abnormal left ventricular relaxation  (grade 1 diastolic  dysfunction).  - Mitral valve: There was mild regurgitation.  - Right atrium: Central venous pressure (est): 3 mm Hg.  - Tricuspid valve: There was trivial regurgitation.  - Pulmonary arteries: PA peak pressure: 20 mm Hg (S).  - Pericardium, extracardiac: A prominent pericardial fat pad was  present.   Impressions:   - Mild LVH with LVEF 65-70% and grade 1 diastolic dysfunction. Mild  mitral regurgitation. Trivial tricuspid regurgitation with PASP  20 mmHg. Prominent pericardial fat pad.   Assessment and Plan:   1. Palpitations Recent increased issues with palpitations after Covid virus in August.  Patient stated she had been trying to adjust her propranolol dosage to accommodate her palpitations and fluctuating blood pressures.  She takes propranolol extended release capsules.  She has been taking 1/2 capsule in the daytime and one half at night for the most part.  Sometimes she has been taking 1 whole capsule in the daytime and 1 at night.  She states at some point during the day the doses seem to overlap and she has low blood pressures and feels dizzy.  Advised her to stretch the dosing interval out to approximately 14 hours instead of 12-hour interval to see if this might help.  We talked about possibly taking the first dose approximately 6 AM and the evening a dose approximately 8 PM to see if this would reduce the overlapping effect of the medications causing her blood pressure to decrease and feel dizzy.  She presents today with no particular complaints of palpitations.  States for the most part the propranolol is managing the palpitations.  Continue propranolol 60 mg p.o. twice daily and as needed for palpitations   2. Essential hypertension She states today her blood pressure has been somewhat labile.  Today her blood pressure is 148/88.  She states sometimes it can be 967 systolic range.  She has some situational stress at home dealing with her husband who  has sequelae from Guillain-Barr syndrome, issues with memory loss, and dementia-like symptoms.  She thinks this may have a bearing on her blood pressure lability.  She states when he was in rehab her blood pressures were normal.  Advised her to follow with PCP for possible antianxiety medications to help with stress which may also help with blood pressure lability.  Medication Adjustments/Labs and Tests Ordered: Current medicines are reviewed at length with the patient today.  Concerns regarding medicines are outlined above.   Disposition: Follow-up with Dr. Harl Bowie or APP 6 months  Signed, Levell July, NP 07/23/2020 10:40  [place] [facility] [place], [place], [place] [address] Phone: [phone]; Fax: [phone]

## 2020-07-23 ENCOUNTER — Encounter: Payer: Self-pay | Admitting: Family Medicine

## 2020-07-23 ENCOUNTER — Ambulatory Visit (INDEPENDENT_AMBULATORY_CARE_PROVIDER_SITE_OTHER): Payer: Medicare Other | Admitting: Family Medicine

## 2020-07-23 VITALS — BP 148/80 | HR 76 | Ht 64.0 in | Wt 134.2 lb

## 2020-07-23 DIAGNOSIS — I10 Essential (primary) hypertension: Secondary | ICD-10-CM

## 2020-07-23 DIAGNOSIS — R002 Palpitations: Secondary | ICD-10-CM

## 2020-07-23 MED ORDER — PROPRANOLOL HCL 60 MG PO TABS
60.0000 mg | ORAL_TABLET | Freq: Two times a day (BID) | ORAL | 6 refills | Status: DC | PRN
Start: 2020-07-23 — End: 2022-03-24

## 2020-07-23 NOTE — Patient Instructions (Signed)
Medication Instructions:  Continue all current medications.   Labwork: none  Testing/Procedures: none  Follow-Up: 6 months   Any Other Special Instructions Will Be Listed Below (If Applicable).   If you need a refill on your cardiac medications before your next appointment, please call your pharmacy.  

## 2020-08-28 ENCOUNTER — Ambulatory Visit: Payer: Medicare Other | Admitting: Family Medicine

## 2020-10-03 ENCOUNTER — Ambulatory Visit (INDEPENDENT_AMBULATORY_CARE_PROVIDER_SITE_OTHER): Payer: Medicare Other | Admitting: Nurse Practitioner

## 2020-11-01 ENCOUNTER — Ambulatory Visit (INDEPENDENT_AMBULATORY_CARE_PROVIDER_SITE_OTHER): Payer: Medicare Other | Admitting: Nurse Practitioner

## 2020-11-19 ENCOUNTER — Ambulatory Visit (INDEPENDENT_AMBULATORY_CARE_PROVIDER_SITE_OTHER): Payer: Medicare Other | Admitting: Nurse Practitioner

## 2020-11-19 ENCOUNTER — Encounter (INDEPENDENT_AMBULATORY_CARE_PROVIDER_SITE_OTHER): Payer: Self-pay | Admitting: Nurse Practitioner

## 2020-11-19 ENCOUNTER — Other Ambulatory Visit: Payer: Self-pay

## 2020-11-19 VITALS — BP 132/74 | HR 76 | Temp 97.9°F | Ht 64.0 in | Wt 140.8 lb

## 2020-11-19 DIAGNOSIS — E875 Hyperkalemia: Secondary | ICD-10-CM | POA: Diagnosis not present

## 2020-11-19 DIAGNOSIS — R002 Palpitations: Secondary | ICD-10-CM

## 2020-11-19 DIAGNOSIS — E785 Hyperlipidemia, unspecified: Secondary | ICD-10-CM

## 2020-11-19 DIAGNOSIS — E559 Vitamin D deficiency, unspecified: Secondary | ICD-10-CM | POA: Diagnosis not present

## 2020-11-19 DIAGNOSIS — I10 Essential (primary) hypertension: Secondary | ICD-10-CM | POA: Diagnosis not present

## 2020-11-19 MED ORDER — ATORVASTATIN CALCIUM 10 MG PO TABS: 10.0000 mg | ORAL_TABLET | Freq: Every day | ORAL | 0 refills | Status: DC

## 2020-11-19 NOTE — Progress Notes (Signed)
Subjective:  Patient ID: Rhonda Andrews, female    DOB: 06-13-1941  Age: 80 y.o. MRN: [id number]  CC:  Chief Complaint  Patient presents with  . Follow-up    Doing okay, no concerns  . Hypertension  . Other    Palpitations, vitamin D deficiency  . Hyperlipidemia      HPI  This patient arrives today for the above.  Hypertension/palpitations: She continues on propanolol as needed for cardiac palpitations but otherwise is not taking any medication for hypertension.   Vitamin D deficiency: She continues on 5000 IUs of vitamin D3 daily.  Last serum check showed a level of 41.  Hyperlipidemia: She is not currently on any medication for treatment of hyperlipidemia.  Last lipid panel showed LDL of 136.  ASCVD risk score is approximately 32.9%  Past Medical History:  Diagnosis Date  . Anxiety   . 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 Clostridium difficile diarrhea 02/05/2015  . Diabetes mellitus without complication (HCC)    prediabetes  . Dizziness   . 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 Hypertension   . IBS (irritable bowel syndrome)   . Lactose intolerance   . Palpitation   . Palpitations   . Vitamin D deficiency disease 06/23/2019      Family History  Problem Relation Age of Onset  . Lymphoma Mother   . Colon cancer Mother   . Hypertension Mother   . Migraines Mother   . Other Father        Wagner's Granulermatosis  . Seizures Sister   . Cancer Maternal Grandmother        brain  . Cancer Maternal Grandfather        brain  . Other Paternal Grandmother        blood clot - DVT following surgery  . Alcohol abuse Paternal Grandfather   . Cirrhosis Paternal Grandfather   . Thyroid disease Sister   . Heart disease Sister     Social History   Social History Narrative   Married. Has a daughter. 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.   Eats all food groups and has a great appetite.    Walks in  house daily in her house, 1.5 miles a day.   Right handed   Quit drinking caffeine 15 years ago   No EtOH/tobacco   Social History   Tobacco Use  . Smoking status: Never Smoker  . Smokeless tobacco: Never Used  Substance Use Topics  . Alcohol use: No    Alcohol/week: 0.0 standard drinks     Current Meds  Medication Sig  . atorvastatin (LIPITOR) 10 MG tablet Take 1 tablet (10 mg total) by mouth daily. In the evening.  . Calcium Carb-Cholecalciferol (CALCIUM + VITAMIN D3 PO) Take 1 tablet by mouth 2 (two) times daily.  . Cholecalciferol (VITAMIN D-3) 125 MCG (5000 UT) TABS Take 1 tablet by mouth daily.  . fluticasone (FLONASE) 50 MCG/ACT nasal spray Place 1 spray into both nostrils as needed for allergies or rhinitis.  Marland Kitchen meclizine (ANTIVERT) 12.5 MG tablet Take 1 tablet (12.5 mg total) by mouth 3 (three) times daily as needed for dizziness.  . propranolol (INDERAL) 60 MG tablet Take 1 tablet (60 mg total) by mouth 2 (two) times daily as needed.  . vitamin B-12 (CYANOCOBALAMIN) 1000 MCG tablet Take 1,000 mcg by mouth daily.    ROS:  Review of Systems  Respiratory: Negative for  shortness of breath.   Cardiovascular: Positive for palpitations (only with stress and eating french fries). Negative for chest pain.  Gastrointestinal: Negative for diarrhea.  Neurological: Positive for dizziness (occasional). Negative for headaches.     Objective:   Today's Vitals: BP 132/74   Pulse 76   Temp 97.9 F (36.6 C) (Temporal)   Ht 5' 4"  (1.626 m)   Wt 140 lb 12.8 oz (63.9 kg)   SpO2 99%   BMI 24.17 kg/m  Vitals with BMI 11/19/2020 07/23/2020 07/02/2020  Height 5' 4"  5' 4"  5' 4"   Weight 140 lbs 13 oz 134 lbs 3 oz 137 lbs  BMI 24.16 90.30 09.2  Systolic 330 076 226  Diastolic 74 80 82  Pulse 76 76 79     Physical Exam Vitals reviewed.  Constitutional:      General: She is not in acute distress.    Appearance: Normal appearance.  HENT:     Head: Normocephalic and atraumatic.   Neck:     Vascular: No carotid bruit.  Cardiovascular:     Rate and Rhythm: Normal rate and regular rhythm.     Pulses: Normal pulses.     Heart sounds: Normal heart sounds.  Pulmonary:     Effort: Pulmonary effort is normal.     Breath sounds: Normal breath sounds.  Skin:    General: Skin is warm and dry.  Neurological:     General: No focal deficit present.     Mental Status: She is alert and oriented to person, place, and time.  Psychiatric:        Mood and Affect: Mood normal.        Behavior: Behavior normal.        Judgment: Judgment normal.          Assessment and Plan   1. Essential hypertension   2. Hyperlipidemia, unspecified hyperlipidemia type   3. Vitamin D deficiency disease   4. Palpitations   5. Hyperkalemia      Plan: 1.  Blood pressure reasonable not make any changes to medication regimen at this time. 2.  We did discuss her ASCVD risk score and what this represents.  She be willing to try a statin therapy for risk reduction and primary prevention of heart attack or stroke.  We will start her on atorvastatin 10 mg daily and return to the office in approximate 6 weeks to see how she is tolerating medication recheck lipid panel. 3.  She will continue on her vitamin D supplement. 4.  She continue taking her propanolol as needed. 5.  Per chart review I did notice that her potassium was slightly elevated last time blood work was checked so we will recheck metabolic panel today.   Tests ordered Orders Placed This Encounter  Procedures  . CMP with eGFR([facility])      Meds ordered this encounter  Medications  . atorvastatin (LIPITOR) 10 MG tablet    Sig: Take 1 tablet (10 mg total) by mouth daily. In the evening.    Dispense:  90 tablet    Refill:  0    Order Specific Question:   Supervising Provider    Answer:   Doree Albee [[id number]]    Patient to follow-up in 6 weeks or sooner as needed.  Ailene Ards, NP

## 2020-11-20 LAB — COMPLETE METABOLIC PANEL WITH GFR
AG Ratio: 1.7 (calc) (ref 1.0–2.5)
ALT: 15 U/L (ref 6–29)
AST: 17 U/L (ref 10–35)
Albumin: 4 g/dL (ref 3.6–5.1)
Alkaline phosphatase (APISO): 68 U/L (ref 37–153)
BUN/Creatinine Ratio: 33 (calc) — ABNORMAL HIGH (ref 6–22)
BUN: 28 mg/dL — ABNORMAL HIGH (ref 7–25)
CO2: 27 mmol/L (ref 20–32)
Calcium: 9.1 mg/dL (ref 8.6–10.4)
Chloride: 105 mmol/L (ref 98–110)
Creat: 0.84 mg/dL (ref 0.60–0.93)
GFR, Est African American: 78 mL/min/{1.73_m2} (ref >=60)
GFR, Est Non African American: 67 mL/min/{1.73_m2} (ref >=60)
Globulin: 2.3 g/dL (calc) (ref 1.9–3.7)
Glucose, Bld: 81 mg/dL (ref 65–139)
Potassium: 4.3 mmol/L (ref 3.5–5.3)
Sodium: 142 mmol/L (ref 135–146)
Total Bilirubin: 0.5 mg/dL (ref 0.2–1.2)
Total Protein: 6.3 g/dL (ref 6.1–8.1)

## 2020-11-21 ENCOUNTER — Other Ambulatory Visit: Payer: Self-pay | Admitting: Nurse Practitioner

## 2021-01-02 ENCOUNTER — Ambulatory Visit (INDEPENDENT_AMBULATORY_CARE_PROVIDER_SITE_OTHER): Payer: Medicare Other | Admitting: Nurse Practitioner

## 2021-01-03 ENCOUNTER — Encounter (INDEPENDENT_AMBULATORY_CARE_PROVIDER_SITE_OTHER): Payer: Self-pay | Admitting: Internal Medicine

## 2021-01-03 ENCOUNTER — Ambulatory Visit
Admission: EM | Admit: 2021-01-03 | Discharge: 2021-01-03 | Disposition: A | Discharge status: Home / Self Care | Payer: Medicare Other | Attending: Emergency Medicine | Admitting: Emergency Medicine

## 2021-01-03 ENCOUNTER — Telehealth (INDEPENDENT_AMBULATORY_CARE_PROVIDER_SITE_OTHER): Payer: Self-pay

## 2021-01-03 ENCOUNTER — Other Ambulatory Visit: Payer: Self-pay

## 2021-01-03 ENCOUNTER — Ambulatory Visit (INDEPENDENT_AMBULATORY_CARE_PROVIDER_SITE_OTHER): Payer: Medicare Other | Admitting: Nurse Practitioner

## 2021-01-03 DIAGNOSIS — W57XXXA Bitten or stung by nonvenomous insect and other nonvenomous arthropods, initial encounter: Secondary | ICD-10-CM

## 2021-01-03 DIAGNOSIS — S80862A Insect bite (nonvenomous), left lower leg, initial encounter: Secondary | ICD-10-CM | POA: Diagnosis not present

## 2021-01-03 DIAGNOSIS — L299 Pruritus, unspecified: Secondary | ICD-10-CM

## 2021-01-03 DIAGNOSIS — S80861A Insect bite (nonvenomous), right lower leg, initial encounter: Secondary | ICD-10-CM | POA: Diagnosis not present

## 2021-01-03 MED ORDER — CALAMINE EX LOTN: 1.0000 "application " | TOPICAL_LOTION | CUTANEOUS | 0 refills | Status: DC | PRN

## 2021-01-03 MED ORDER — MUPIROCIN 2 % EX OINT: 1.0000 "application " | TOPICAL_OINTMENT | Freq: Two times a day (BID) | CUTANEOUS | 0 refills | Status: DC

## 2021-01-03 NOTE — ED Provider Notes (Signed)
[place]   [id number] 01/03/21 Arrival Time: 1750   GE:[id number]  SUBJECTIVE:  Rhonda Andrews is a 80 y.o. female who presents with a insect sting/ bite to LLE and RLE, as well as LT knee x 2 days ago.  Unsure what stung or bit her. Has tried rx'ed ointment/ cream with relief.  Not sore or itching to the touch.  Reports similar bites/ stings last week.  Denies fever, chills, nausea, vomiting.  Reports redness and swelling, but that has improved  ROS: As per HPI.  All other pertinent ROS negative.     Past Medical History:  Diagnosis Date  . Anxiety   . 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 Clostridium difficile diarrhea 02/05/2015  . Diabetes mellitus without complication (HCC)    prediabetes  . Dizziness   . 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 Hypertension   . IBS (irritable bowel syndrome)   . Lactose intolerance   . Palpitation   . Palpitations   . Vitamin D deficiency disease 06/23/2019   Past Surgical History:  Procedure Laterality Date  . ABDOMINAL HYSTERECTOMY     ovaries remained  . CHOLECYSTECTOMY    . COLONOSCOPY  2010  . COLONOSCOPY WITH PROPOFOL N/A 05/21/2018   Procedure: COLONOSCOPY WITH PROPOFOL;  Surgeon: Gatha Mayer, MD;  Location: [facility];  Service: Endoscopy;  Laterality: N/A;  with FMT  . 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 KNEE ARTHROSCOPY Right   . Precancerous  Tissue - Nose  February 14-2014   Allergies  Allergen Reactions  . Hydrocodone Nausea And Vomiting  . Prednisone Other (See Comments)    Heart races   No current facility-administered medications on file prior to encounter.   Current Outpatient Medications on File Prior to Encounter  Medication Sig Dispense Refill  . Calcium Carb-Cholecalciferol (CALCIUM + VITAMIN D3 PO) Take 1 tablet by mouth 2 (two) times daily.    . Cholecalciferol (VITAMIN D-3) 125 MCG (5000  UT) TABS Take 1 tablet by mouth daily.    . fluticasone (FLONASE) 50 MCG/ACT nasal spray PLACE 2 SPRAYS IN BOTH NOSTRILS DAILY. 16 g 3  . meclizine (ANTIVERT) 12.5 MG tablet Take 1 tablet (12.5 mg total) by mouth 3 (three) times daily as needed for dizziness. 30 tablet 2  . propranolol (INDERAL) 60 MG tablet Take 1 tablet (60 mg total) by mouth 2 (two) times daily as needed. 60 tablet 6  . vitamin B-12 (CYANOCOBALAMIN) 1000 MCG tablet Take 1,000 mcg by mouth daily.    . [DISCONTINUED] atorvastatin (LIPITOR) 10 MG tablet Take 1 tablet (10 mg total) by mouth daily. In the evening. 90 tablet 0   Social History   Socioeconomic History  . Marital status: Married    Spouse name: Not on file  . Number of children: 1  . Years of education: Not on file  . Highest education level: Some college, no degree  Occupational History  . Not on file  Tobacco Use  . Smoking status: Never Smoker  . Smokeless tobacco: Never Used  Vaping Use  . Vaping Use: Never used  Substance and Sexual Activity  . Alcohol use: No    Alcohol/week: 0.0 standard drinks  . Drug use: No  . Sexual activity: Not on file  Other Topics Concern  . Not on file  Social History Narrative   Married. Has a daughter. 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   Eats all food groups and has a great appetite.    Walks in house daily in her house, 1.5 miles a day.   Right handed   Quit drinking caffeine 15 years ago   No EtOH/tobacco   Social Determinants of Radio broadcast assistant Strain: Not on file  Food Insecurity: Not on file  Transportation Needs: Not on file  Physical Activity: Not on file  Stress: Not on file  Social Connections: Not on file  Intimate Partner Violence: Not on file   Family History  Problem Relation Age of Onset  . Lymphoma Mother   . Colon cancer Mother   . Hypertension Mother   . Migraines Mother   . Other Father        Wagner's  Granulermatosis  . Seizures Sister   . Cancer Maternal Grandmother        brain  . Cancer Maternal Grandfather        brain  . Other Paternal Grandmother        blood clot - DVT following surgery  . Alcohol abuse Paternal Grandfather   . Cirrhosis Paternal Grandfather   . Thyroid disease Sister   . Heart disease Sister     OBJECTIVE:  Vitals:   01/03/21 1803  BP: (!) 171/88  Pulse: 81  Resp: 18  Temp: 97.8 F (36.6 C)  SpO2: 97%     General appearance: alert; no distress Skin: 2 cm area of erythema with central blister to LT medial ankle, 1 cm area of erythema with papule to RT medial ankle, mild erythema to medial distal LT thigh; NTTP, no obvious drainage or bleeding Psychological: alert and cooperative; normal mood and affect   ASSESSMENT & PLAN:  1. Insect bite of left lower leg, initial encounter   2. Insect bite of right leg, initial encounter   3. Itching     Meds ordered this encounter  Medications  . mupirocin ointment (BACTROBAN) 2 %    Sig: Apply 1 application topically 2 (two) times daily.    Dispense:  22 g    Refill:  0    Order Specific Question:   Supervising Provider    Answer:   Raylene Everts [[id number]]  . calamine lotion    Sig: Apply 1 application topically as needed for itching.    Dispense:  120 mL    Refill:  0    Order Specific Question:   Supervising Provider    Answer:   Raylene Everts [[id number]]     Wash site daily with warm water and mild soap Keep covered to avoid friction Bactroban and calamine prescribed for infection and itching.  Use as directed Follow up here or with PCP if symptoms persists Return or go to the ED if you have any new or worsening symptoms increased redness, swelling, pain, nausea, vomiting, fever, chills, etc...   Reviewed expectations re: course of current medical issues. Questions answered. Outlined signs and symptoms indicating need for more acute intervention. Patient verbalized  understanding. After Visit Summary given.          Lestine Box, PA-C 01/03/21 1831

## 2021-01-03 NOTE — Telephone Encounter (Signed)
Called patient and gave her the message. Patient verbalized an understanding and stated that she will go to the [facility].

## 2021-01-03 NOTE — Discharge Instructions (Addendum)
Wash site daily with warm water and mild soap Keep covered to avoid friction Bactroban and calamine prescribed for infection and itching.  Use as directed Follow up here or with PCP if symptoms persists Return or go to the ED if you have any new or worsening symptoms increased redness, swelling, pain, nausea, vomiting, fever, chills, etc..Marland Kitchen

## 2021-01-03 NOTE — Telephone Encounter (Signed)
Pictures on my chart will not be helpful whatsoever and I will not be able to diagnose anything with those pictures.  Please tell her not to send the pictures.  Since we cannot see her today, she needs to go to an urgent care to be seen as soon as possible.  I am worried that things may get worse before the weekend.

## 2021-01-03 NOTE — ED Triage Notes (Signed)
Pt presents with large insect bite on left ankle that is red and irritated, happened on Tuesday

## 2021-01-03 NOTE — Telephone Encounter (Signed)
Patient called and stated that something stung or bit her on both of her ankles and her left knee and they are red and she put some cream on them to help calm them down as they were very itchy. Patient hurt her knee trying to get ready to come today and could not walk to her car to be able to come in and had to cancel. Patient is going to send pictures on her mychart to show you as she is not sure if she needs to do anything else. Patient stated that they are getting some better. Patient was cleaning out her dog lot when it happened and felt something burn and noticed the spots.

## 2021-01-04 DIAGNOSIS — M9903 Segmental and somatic dysfunction of lumbar region: Secondary | ICD-10-CM | POA: Diagnosis not present

## 2021-01-04 DIAGNOSIS — S134XXA Sprain of ligaments of cervical spine, initial encounter: Secondary | ICD-10-CM | POA: Diagnosis not present

## 2021-01-04 DIAGNOSIS — M47816 Spondylosis without myelopathy or radiculopathy, lumbar region: Secondary | ICD-10-CM | POA: Diagnosis not present

## 2021-01-04 DIAGNOSIS — S233XXA Sprain of ligaments of thoracic spine, initial encounter: Secondary | ICD-10-CM | POA: Diagnosis not present

## 2021-01-04 DIAGNOSIS — M9902 Segmental and somatic dysfunction of thoracic region: Secondary | ICD-10-CM | POA: Diagnosis not present

## 2021-01-04 DIAGNOSIS — M9901 Segmental and somatic dysfunction of cervical region: Secondary | ICD-10-CM | POA: Diagnosis not present

## 2021-01-07 DIAGNOSIS — L500 Allergic urticaria: Secondary | ICD-10-CM | POA: Diagnosis not present

## 2021-01-08 DIAGNOSIS — S233XXA Sprain of ligaments of thoracic spine, initial encounter: Secondary | ICD-10-CM | POA: Diagnosis not present

## 2021-01-08 DIAGNOSIS — M47816 Spondylosis without myelopathy or radiculopathy, lumbar region: Secondary | ICD-10-CM | POA: Diagnosis not present

## 2021-01-08 DIAGNOSIS — M9902 Segmental and somatic dysfunction of thoracic region: Secondary | ICD-10-CM | POA: Diagnosis not present

## 2021-01-08 DIAGNOSIS — M9903 Segmental and somatic dysfunction of lumbar region: Secondary | ICD-10-CM | POA: Diagnosis not present

## 2021-01-08 DIAGNOSIS — M9901 Segmental and somatic dysfunction of cervical region: Secondary | ICD-10-CM | POA: Diagnosis not present

## 2021-01-08 DIAGNOSIS — S134XXA Sprain of ligaments of cervical spine, initial encounter: Secondary | ICD-10-CM | POA: Diagnosis not present

## 2021-01-11 DIAGNOSIS — S233XXA Sprain of ligaments of thoracic spine, initial encounter: Secondary | ICD-10-CM | POA: Diagnosis not present

## 2021-01-11 DIAGNOSIS — M9901 Segmental and somatic dysfunction of cervical region: Secondary | ICD-10-CM | POA: Diagnosis not present

## 2021-01-11 DIAGNOSIS — M47816 Spondylosis without myelopathy or radiculopathy, lumbar region: Secondary | ICD-10-CM | POA: Diagnosis not present

## 2021-01-11 DIAGNOSIS — M9902 Segmental and somatic dysfunction of thoracic region: Secondary | ICD-10-CM | POA: Diagnosis not present

## 2021-01-11 DIAGNOSIS — M9903 Segmental and somatic dysfunction of lumbar region: Secondary | ICD-10-CM | POA: Diagnosis not present

## 2021-01-11 DIAGNOSIS — S134XXA Sprain of ligaments of cervical spine, initial encounter: Secondary | ICD-10-CM | POA: Diagnosis not present

## 2021-01-15 DIAGNOSIS — S233XXA Sprain of ligaments of thoracic spine, initial encounter: Secondary | ICD-10-CM | POA: Diagnosis not present

## 2021-01-15 DIAGNOSIS — M47816 Spondylosis without myelopathy or radiculopathy, lumbar region: Secondary | ICD-10-CM | POA: Diagnosis not present

## 2021-01-15 DIAGNOSIS — S134XXA Sprain of ligaments of cervical spine, initial encounter: Secondary | ICD-10-CM | POA: Diagnosis not present

## 2021-01-15 DIAGNOSIS — M9903 Segmental and somatic dysfunction of lumbar region: Secondary | ICD-10-CM | POA: Diagnosis not present

## 2021-01-15 DIAGNOSIS — M9902 Segmental and somatic dysfunction of thoracic region: Secondary | ICD-10-CM | POA: Diagnosis not present

## 2021-01-15 DIAGNOSIS — M9901 Segmental and somatic dysfunction of cervical region: Secondary | ICD-10-CM | POA: Diagnosis not present

## 2021-01-16 ENCOUNTER — Ambulatory Visit: Payer: Medicare Other | Admitting: Orthopaedic Surgery

## 2021-01-21 DIAGNOSIS — S233XXA Sprain of ligaments of thoracic spine, initial encounter: Secondary | ICD-10-CM | POA: Diagnosis not present

## 2021-01-21 DIAGNOSIS — M9901 Segmental and somatic dysfunction of cervical region: Secondary | ICD-10-CM | POA: Diagnosis not present

## 2021-01-21 DIAGNOSIS — M9903 Segmental and somatic dysfunction of lumbar region: Secondary | ICD-10-CM | POA: Diagnosis not present

## 2021-01-21 DIAGNOSIS — S134XXA Sprain of ligaments of cervical spine, initial encounter: Secondary | ICD-10-CM | POA: Diagnosis not present

## 2021-01-21 DIAGNOSIS — M9902 Segmental and somatic dysfunction of thoracic region: Secondary | ICD-10-CM | POA: Diagnosis not present

## 2021-01-21 DIAGNOSIS — M47816 Spondylosis without myelopathy or radiculopathy, lumbar region: Secondary | ICD-10-CM | POA: Diagnosis not present

## 2021-01-23 DIAGNOSIS — M9901 Segmental and somatic dysfunction of cervical region: Secondary | ICD-10-CM | POA: Diagnosis not present

## 2021-01-23 DIAGNOSIS — S134XXA Sprain of ligaments of cervical spine, initial encounter: Secondary | ICD-10-CM | POA: Diagnosis not present

## 2021-01-23 DIAGNOSIS — M9903 Segmental and somatic dysfunction of lumbar region: Secondary | ICD-10-CM | POA: Diagnosis not present

## 2021-01-23 DIAGNOSIS — M47816 Spondylosis without myelopathy or radiculopathy, lumbar region: Secondary | ICD-10-CM | POA: Diagnosis not present

## 2021-01-23 DIAGNOSIS — S233XXA Sprain of ligaments of thoracic spine, initial encounter: Secondary | ICD-10-CM | POA: Diagnosis not present

## 2021-01-23 DIAGNOSIS — M9902 Segmental and somatic dysfunction of thoracic region: Secondary | ICD-10-CM | POA: Diagnosis not present

## 2021-01-24 ENCOUNTER — Ambulatory Visit: Payer: Medicare Other | Admitting: Cardiology

## 2021-01-25 DIAGNOSIS — M47816 Spondylosis without myelopathy or radiculopathy, lumbar region: Secondary | ICD-10-CM | POA: Diagnosis not present

## 2021-01-25 DIAGNOSIS — M9902 Segmental and somatic dysfunction of thoracic region: Secondary | ICD-10-CM | POA: Diagnosis not present

## 2021-01-25 DIAGNOSIS — S233XXA Sprain of ligaments of thoracic spine, initial encounter: Secondary | ICD-10-CM | POA: Diagnosis not present

## 2021-01-25 DIAGNOSIS — M9903 Segmental and somatic dysfunction of lumbar region: Secondary | ICD-10-CM | POA: Diagnosis not present

## 2021-01-25 DIAGNOSIS — M9901 Segmental and somatic dysfunction of cervical region: Secondary | ICD-10-CM | POA: Diagnosis not present

## 2021-01-25 DIAGNOSIS — S134XXA Sprain of ligaments of cervical spine, initial encounter: Secondary | ICD-10-CM | POA: Diagnosis not present

## 2021-01-29 DIAGNOSIS — M47816 Spondylosis without myelopathy or radiculopathy, lumbar region: Secondary | ICD-10-CM | POA: Diagnosis not present

## 2021-01-29 DIAGNOSIS — S134XXA Sprain of ligaments of cervical spine, initial encounter: Secondary | ICD-10-CM | POA: Diagnosis not present

## 2021-01-29 DIAGNOSIS — M9903 Segmental and somatic dysfunction of lumbar region: Secondary | ICD-10-CM | POA: Diagnosis not present

## 2021-01-29 DIAGNOSIS — M9902 Segmental and somatic dysfunction of thoracic region: Secondary | ICD-10-CM | POA: Diagnosis not present

## 2021-01-29 DIAGNOSIS — S233XXA Sprain of ligaments of thoracic spine, initial encounter: Secondary | ICD-10-CM | POA: Diagnosis not present

## 2021-01-29 DIAGNOSIS — M9901 Segmental and somatic dysfunction of cervical region: Secondary | ICD-10-CM | POA: Diagnosis not present

## 2021-01-30 ENCOUNTER — Other Ambulatory Visit: Payer: Self-pay

## 2021-01-30 ENCOUNTER — Encounter (INDEPENDENT_AMBULATORY_CARE_PROVIDER_SITE_OTHER): Payer: Self-pay | Admitting: Nurse Practitioner

## 2021-01-30 ENCOUNTER — Ambulatory Visit (INDEPENDENT_AMBULATORY_CARE_PROVIDER_SITE_OTHER): Payer: Medicare Other | Admitting: Nurse Practitioner

## 2021-01-30 VITALS — BP 118/76 | HR 73 | Temp 97.7°F | Ht 64.0 in | Wt 139.4 lb

## 2021-01-30 DIAGNOSIS — E785 Hyperlipidemia, unspecified: Secondary | ICD-10-CM

## 2021-01-30 NOTE — Progress Notes (Signed)
Subjective:  Patient ID: Rhonda Andrews, female    DOB: 1940-11-04  Age: 80 y.o. MRN: [id number]  CC:  Chief Complaint  Patient presents with  . Follow-up    Doing okay  . Hyperlipidemia      HPI  This patient arrives today for the above.  Hyperlipidemia: At last office visit we discussed trialing atorvastatin in hopes of reducing her ASCVD risk or which is approximately 30 to.  However, she tells me she tried it and she did not tolerate medication well so she stopped taking it.  Since stopping medicine she feels well and has no complaints today.  Past Medical History:  Diagnosis Date  . Anxiety   . 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 Clostridium difficile diarrhea 02/05/2015  . Diabetes mellitus without complication (HCC)    prediabetes  . Dizziness   . 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 Hypertension   . IBS (irritable bowel syndrome)   . Lactose intolerance   . Palpitation   . Palpitations   . Vitamin D deficiency disease 06/23/2019      Family History  Problem Relation Age of Onset  . Lymphoma Mother   . Colon cancer Mother   . Hypertension Mother   . Migraines Mother   . Other Father        Wagner's Granulermatosis  . Seizures Sister   . Cancer Maternal Grandmother        brain  . Cancer Maternal Grandfather        brain  . Other Paternal Grandmother        blood clot - DVT following surgery  . Alcohol abuse Paternal Grandfather   . Cirrhosis Paternal Grandfather   . Thyroid disease Sister   . Heart disease Sister     Social History   Social History Narrative   Married. Has a daughter. 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   Eats all food groups and has a great appetite.    Walks in house daily in her house, 1.5 miles a day.   Right handed   Quit drinking caffeine 15 years ago   No EtOH/tobacco   Social History   Tobacco Use  . Smoking status: Never Smoker  . Smokeless  tobacco: Never Used  Substance Use Topics  . Alcohol use: No    Alcohol/week: 0.0 standard drinks     Current Meds  Medication Sig  . calamine lotion Apply 1 application topically as needed for itching.  . Calcium Carb-Cholecalciferol (CALCIUM + VITAMIN D3 PO) Take 1 tablet by mouth 2 (two) times daily.  . Cholecalciferol (VITAMIN D-3) 125 MCG (5000 UT) TABS Take 1 tablet by mouth daily.  . fluticasone (FLONASE) 50 MCG/ACT nasal spray PLACE 2 SPRAYS IN BOTH NOSTRILS DAILY.  . meclizine (ANTIVERT) 12.5 MG tablet Take 1 tablet (12.5 mg total) by mouth 3 (three) times daily as needed for dizziness.  . mupirocin ointment (BACTROBAN) 2 % Apply 1 application topically 2 (two) times daily.  . propranolol (INDERAL) 60 MG tablet Take 1 tablet (60 mg total) by mouth 2 (two) times daily as needed.  . vitamin B-12 (CYANOCOBALAMIN) 1000 MCG tablet Take 1,000 mcg by mouth daily.    ROS:  Review of Systems  Constitutional: Negative for malaise/fatigue.  Respiratory: Negative for shortness of breath.   Cardiovascular: Negative for chest pain.     Objective:   Today's Vitals: BP 118/76  Pulse 73   Temp 97.7 F (36.5 C) (Temporal)   Ht 5\' 4"  (1.626 m)   Wt 139 lb 6.4 oz (63.2 kg)   SpO2 98%   BMI 23.93 kg/m  Vitals with BMI 01/30/2021 01/03/2021 11/19/2020  Height 5\' 4"  - 5\' 4"   Weight 139 lbs 6 oz - 140 lbs 13 oz  BMI 28.36 - 62.94  Systolic 765 465 035  Diastolic 76 88 74  Pulse 73 81 76     Physical Exam Vitals reviewed.  Constitutional:      General: She is not in acute distress.    Appearance: Normal appearance.  HENT:     Head: Normocephalic and atraumatic.  Neck:     Vascular: No carotid bruit.  Cardiovascular:     Rate and Rhythm: Normal rate and regular rhythm.     Pulses: Normal pulses.     Heart sounds: Normal heart sounds.  Pulmonary:     Effort: Pulmonary effort is normal.     Breath sounds: Normal breath sounds.  Skin:    General: Skin is warm and dry.   Neurological:     General: No focal deficit present.     Mental Status: She is alert and oriented to person, place, and time.  Psychiatric:        Mood and Affect: Mood normal.        Behavior: Behavior normal.        Judgment: Judgment normal.          Assessment and Plan   1. Hyperlipidemia, unspecified hyperlipidemia type      Plan: 1.  She will not take the atorvastatin.  We did discuss possibly starting a different agent such as Zetia, but she is not agreeable to taking any medication for it or cholesterol at this time.  Now she does focus on lifestyle behaviors for treatment of her hyperlipidemia.   Tests ordered No orders of the defined types were placed in this encounter.     No orders of the defined types were placed in this encounter.   Patient to follow-up in approximately 6 months or sooner as needed.  Ailene Ards, NP

## 2021-02-05 DIAGNOSIS — M9902 Segmental and somatic dysfunction of thoracic region: Secondary | ICD-10-CM | POA: Diagnosis not present

## 2021-02-05 DIAGNOSIS — S134XXA Sprain of ligaments of cervical spine, initial encounter: Secondary | ICD-10-CM | POA: Diagnosis not present

## 2021-02-05 DIAGNOSIS — M9903 Segmental and somatic dysfunction of lumbar region: Secondary | ICD-10-CM | POA: Diagnosis not present

## 2021-02-05 DIAGNOSIS — S233XXA Sprain of ligaments of thoracic spine, initial encounter: Secondary | ICD-10-CM | POA: Diagnosis not present

## 2021-02-05 DIAGNOSIS — M47816 Spondylosis without myelopathy or radiculopathy, lumbar region: Secondary | ICD-10-CM | POA: Diagnosis not present

## 2021-02-05 DIAGNOSIS — M9901 Segmental and somatic dysfunction of cervical region: Secondary | ICD-10-CM | POA: Diagnosis not present

## 2021-02-07 DIAGNOSIS — M47816 Spondylosis without myelopathy or radiculopathy, lumbar region: Secondary | ICD-10-CM | POA: Diagnosis not present

## 2021-02-07 DIAGNOSIS — M9902 Segmental and somatic dysfunction of thoracic region: Secondary | ICD-10-CM | POA: Diagnosis not present

## 2021-02-07 DIAGNOSIS — S134XXA Sprain of ligaments of cervical spine, initial encounter: Secondary | ICD-10-CM | POA: Diagnosis not present

## 2021-02-07 DIAGNOSIS — S233XXA Sprain of ligaments of thoracic spine, initial encounter: Secondary | ICD-10-CM | POA: Diagnosis not present

## 2021-02-07 DIAGNOSIS — M9903 Segmental and somatic dysfunction of lumbar region: Secondary | ICD-10-CM | POA: Diagnosis not present

## 2021-02-07 DIAGNOSIS — M9901 Segmental and somatic dysfunction of cervical region: Secondary | ICD-10-CM | POA: Diagnosis not present

## 2021-02-11 DIAGNOSIS — M47816 Spondylosis without myelopathy or radiculopathy, lumbar region: Secondary | ICD-10-CM | POA: Diagnosis not present

## 2021-02-11 DIAGNOSIS — M9901 Segmental and somatic dysfunction of cervical region: Secondary | ICD-10-CM | POA: Diagnosis not present

## 2021-02-11 DIAGNOSIS — M9902 Segmental and somatic dysfunction of thoracic region: Secondary | ICD-10-CM | POA: Diagnosis not present

## 2021-02-11 DIAGNOSIS — S233XXA Sprain of ligaments of thoracic spine, initial encounter: Secondary | ICD-10-CM | POA: Diagnosis not present

## 2021-02-11 DIAGNOSIS — M9903 Segmental and somatic dysfunction of lumbar region: Secondary | ICD-10-CM | POA: Diagnosis not present

## 2021-02-11 DIAGNOSIS — S134XXA Sprain of ligaments of cervical spine, initial encounter: Secondary | ICD-10-CM | POA: Diagnosis not present

## 2021-02-14 DIAGNOSIS — M47816 Spondylosis without myelopathy or radiculopathy, lumbar region: Secondary | ICD-10-CM | POA: Diagnosis not present

## 2021-02-14 DIAGNOSIS — M9902 Segmental and somatic dysfunction of thoracic region: Secondary | ICD-10-CM | POA: Diagnosis not present

## 2021-02-14 DIAGNOSIS — M9903 Segmental and somatic dysfunction of lumbar region: Secondary | ICD-10-CM | POA: Diagnosis not present

## 2021-02-14 DIAGNOSIS — S134XXA Sprain of ligaments of cervical spine, initial encounter: Secondary | ICD-10-CM | POA: Diagnosis not present

## 2021-02-14 DIAGNOSIS — M9901 Segmental and somatic dysfunction of cervical region: Secondary | ICD-10-CM | POA: Diagnosis not present

## 2021-02-14 DIAGNOSIS — S233XXA Sprain of ligaments of thoracic spine, initial encounter: Secondary | ICD-10-CM | POA: Diagnosis not present

## 2021-02-18 DIAGNOSIS — M9903 Segmental and somatic dysfunction of lumbar region: Secondary | ICD-10-CM | POA: Diagnosis not present

## 2021-02-18 DIAGNOSIS — M9902 Segmental and somatic dysfunction of thoracic region: Secondary | ICD-10-CM | POA: Diagnosis not present

## 2021-02-18 DIAGNOSIS — M9901 Segmental and somatic dysfunction of cervical region: Secondary | ICD-10-CM | POA: Diagnosis not present

## 2021-02-18 DIAGNOSIS — M47816 Spondylosis without myelopathy or radiculopathy, lumbar region: Secondary | ICD-10-CM | POA: Diagnosis not present

## 2021-02-18 DIAGNOSIS — S134XXA Sprain of ligaments of cervical spine, initial encounter: Secondary | ICD-10-CM | POA: Diagnosis not present

## 2021-02-18 DIAGNOSIS — S233XXA Sprain of ligaments of thoracic spine, initial encounter: Secondary | ICD-10-CM | POA: Diagnosis not present

## 2021-02-21 DIAGNOSIS — S134XXA Sprain of ligaments of cervical spine, initial encounter: Secondary | ICD-10-CM | POA: Diagnosis not present

## 2021-02-21 DIAGNOSIS — M47816 Spondylosis without myelopathy or radiculopathy, lumbar region: Secondary | ICD-10-CM | POA: Diagnosis not present

## 2021-02-21 DIAGNOSIS — M9901 Segmental and somatic dysfunction of cervical region: Secondary | ICD-10-CM | POA: Diagnosis not present

## 2021-02-21 DIAGNOSIS — S233XXA Sprain of ligaments of thoracic spine, initial encounter: Secondary | ICD-10-CM | POA: Diagnosis not present

## 2021-02-21 DIAGNOSIS — M9902 Segmental and somatic dysfunction of thoracic region: Secondary | ICD-10-CM | POA: Diagnosis not present

## 2021-02-21 DIAGNOSIS — M9903 Segmental and somatic dysfunction of lumbar region: Secondary | ICD-10-CM | POA: Diagnosis not present

## 2021-02-25 DIAGNOSIS — S233XXA Sprain of ligaments of thoracic spine, initial encounter: Secondary | ICD-10-CM | POA: Diagnosis not present

## 2021-02-25 DIAGNOSIS — M9901 Segmental and somatic dysfunction of cervical region: Secondary | ICD-10-CM | POA: Diagnosis not present

## 2021-02-25 DIAGNOSIS — M47816 Spondylosis without myelopathy or radiculopathy, lumbar region: Secondary | ICD-10-CM | POA: Diagnosis not present

## 2021-02-25 DIAGNOSIS — M9903 Segmental and somatic dysfunction of lumbar region: Secondary | ICD-10-CM | POA: Diagnosis not present

## 2021-02-25 DIAGNOSIS — S134XXA Sprain of ligaments of cervical spine, initial encounter: Secondary | ICD-10-CM | POA: Diagnosis not present

## 2021-02-25 DIAGNOSIS — M9902 Segmental and somatic dysfunction of thoracic region: Secondary | ICD-10-CM | POA: Diagnosis not present

## 2021-03-05 DIAGNOSIS — M47816 Spondylosis without myelopathy or radiculopathy, lumbar region: Secondary | ICD-10-CM | POA: Diagnosis not present

## 2021-03-05 DIAGNOSIS — M9902 Segmental and somatic dysfunction of thoracic region: Secondary | ICD-10-CM | POA: Diagnosis not present

## 2021-03-05 DIAGNOSIS — M9903 Segmental and somatic dysfunction of lumbar region: Secondary | ICD-10-CM | POA: Diagnosis not present

## 2021-03-05 DIAGNOSIS — M9901 Segmental and somatic dysfunction of cervical region: Secondary | ICD-10-CM | POA: Diagnosis not present

## 2021-03-05 DIAGNOSIS — S233XXA Sprain of ligaments of thoracic spine, initial encounter: Secondary | ICD-10-CM | POA: Diagnosis not present

## 2021-03-05 DIAGNOSIS — S134XXA Sprain of ligaments of cervical spine, initial encounter: Secondary | ICD-10-CM | POA: Diagnosis not present

## 2021-03-12 DIAGNOSIS — S134XXA Sprain of ligaments of cervical spine, initial encounter: Secondary | ICD-10-CM | POA: Diagnosis not present

## 2021-03-12 DIAGNOSIS — M9903 Segmental and somatic dysfunction of lumbar region: Secondary | ICD-10-CM | POA: Diagnosis not present

## 2021-03-12 DIAGNOSIS — M9901 Segmental and somatic dysfunction of cervical region: Secondary | ICD-10-CM | POA: Diagnosis not present

## 2021-03-12 DIAGNOSIS — M9902 Segmental and somatic dysfunction of thoracic region: Secondary | ICD-10-CM | POA: Diagnosis not present

## 2021-03-12 DIAGNOSIS — S233XXA Sprain of ligaments of thoracic spine, initial encounter: Secondary | ICD-10-CM | POA: Diagnosis not present

## 2021-03-12 DIAGNOSIS — M47816 Spondylosis without myelopathy or radiculopathy, lumbar region: Secondary | ICD-10-CM | POA: Diagnosis not present

## 2021-03-19 DIAGNOSIS — M9902 Segmental and somatic dysfunction of thoracic region: Secondary | ICD-10-CM | POA: Diagnosis not present

## 2021-03-19 DIAGNOSIS — M47816 Spondylosis without myelopathy or radiculopathy, lumbar region: Secondary | ICD-10-CM | POA: Diagnosis not present

## 2021-03-19 DIAGNOSIS — M9901 Segmental and somatic dysfunction of cervical region: Secondary | ICD-10-CM | POA: Diagnosis not present

## 2021-03-19 DIAGNOSIS — S134XXA Sprain of ligaments of cervical spine, initial encounter: Secondary | ICD-10-CM | POA: Diagnosis not present

## 2021-03-19 DIAGNOSIS — M9903 Segmental and somatic dysfunction of lumbar region: Secondary | ICD-10-CM | POA: Diagnosis not present

## 2021-03-19 DIAGNOSIS — S233XXA Sprain of ligaments of thoracic spine, initial encounter: Secondary | ICD-10-CM | POA: Diagnosis not present

## 2021-03-20 ENCOUNTER — Ambulatory Visit (INDEPENDENT_AMBULATORY_CARE_PROVIDER_SITE_OTHER): Payer: Medicare Other | Admitting: Cardiology

## 2021-03-20 ENCOUNTER — Encounter: Payer: Self-pay | Admitting: Cardiology

## 2021-03-20 VITALS — BP 110/70 | HR 66 | Ht 64.0 in | Wt 139.2 lb

## 2021-03-20 DIAGNOSIS — R002 Palpitations: Secondary | ICD-10-CM

## 2021-03-20 NOTE — Patient Instructions (Signed)

## 2021-03-20 NOTE — Progress Notes (Signed)
Clinical Summary Rhonda Andrews is a 80 y.o.female seen today for follow up of the following medical problems.   1. Palpitations/Dizziness - previous holter 05/2009 showed no significant arrhythmias.   - during prior admission did have short episode of PSVT - she was changed from propanolol to atenolol. With change had increase in symptoms and she changed herself back to propanolol - she reports short acting propanolol does not work as well, prefers long acting.    - diltiazem caused low bp's at home per her report, she stopped taking.     - tried midodine x 2 weeks, SBP 130s. Reports insomnia and she stopped taking. We had tried to see if could stabilize her bp to allow more stable use of her beta blocker   - restarted taking propranolol. She has unconvnetional way of taking propranolol long acting that she has done for many years, she splits the individual capsule into 2 separate doses. We have tried several other doses and meds which she did not tolerate and thus have accepted this atypical regimen.    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 120 mL 0   Calcium Carb-Cholecalciferol (CALCIUM + VITAMIN D3 PO) Take 1 tablet by mouth 2 (two) times daily.     Cholecalciferol (VITAMIN D-3) 125 MCG (5000 UT) TABS  Take 1 tablet by mouth daily.     fluticasone (FLONASE) 50 MCG/ACT nasal spray PLACE 2 SPRAYS IN BOTH NOSTRILS DAILY. 16 g 3   meclizine (ANTIVERT) 12.5 MG tablet Take 1 tablet (12.5 mg total) by mouth 3 (three) times daily as needed for dizziness. 30 tablet 2   mupirocin ointment (BACTROBAN) 2 % Apply 1 application topically 2 (two) times daily. 22 g 0   propranolol (INDERAL) 60 MG tablet Take 1 tablet (60 mg total) by mouth 2 (two) times daily as needed. 60 tablet 6   vitamin B-12 (CYANOCOBALAMIN) 1000 MCG tablet Take 1,000 mcg by mouth daily.     No current facility-administered medications for this visit.     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   Procedure: FECAL TRANSPLANT;  Surgeon: Gatha Mayer, MD;  Location: [facility];  Service: Endoscopy;;   KNEE ARTHROSCOPY Right    Precancerous  Tissue - Nose  February 14-2014     Allergies  Allergen Reactions   Hydrocodone Nausea And Vomiting   Prednisone Other (See Comments)  Heart races      Family History  Problem Relation Age of Onset   Lymphoma Mother    Colon cancer Mother    Hypertension Mother    Migraines Mother    Other Father        Wagner's Granulermatosis   Seizures Sister    Cancer Maternal Grandmother        brain   Cancer Maternal Grandfather        brain   Other Paternal Grandmother        blood clot - DVT following surgery   Alcohol abuse Paternal Grandfather    Cirrhosis Paternal Grandfather    Thyroid disease Sister    Heart disease Sister      Social History Rhonda Andrews reports that she has never smoked. She has never used smokeless tobacco. Rhonda Andrews reports no  history of alcohol use.   Review of Systems CONSTITUTIONAL: No weight loss, fever, chills, weakness or fatigue.  HEENT: Eyes: No visual loss, blurred vision, double vision or yellow sclerae.No hearing loss, sneezing, congestion, runny nose or sore throat.  SKIN: No rash or itching.  CARDIOVASCULAR: per hpi RESPIRATORY: No shortness of breath, cough or sputum.  GASTROINTESTINAL: No anorexia, nausea, vomiting or diarrhea. No abdominal pain or blood.  GENITOURINARY: No burning on urination, no polyuria NEUROLOGICAL: No headache, dizziness, syncope, paralysis, ataxia, numbness or tingling in the extremities. No change in bowel or bladder control.  MUSCULOSKELETAL: No muscle, back pain, joint pain or stiffness.  LYMPHATICS: No enlarged nodes. No history of splenectomy.  PSYCHIATRIC: No history of depression or anxiety.  ENDOCRINOLOGIC: No reports of sweating, cold or heat intolerance. No polyuria or polydipsia.  Marland Kitchen   Physical Examination Today's Vitals   03/20/21 1526  BP: 110/70  Pulse: 66  SpO2: 98%  Weight: 139 lb 3.2 oz (63.1 kg)  Height: 5\' 4"  (1.626 m)   Body mass index is 23.89 kg/m.  Gen: resting comfortably, no acute distress HEENT: no scleral icterus, pupils equal round and reactive, no palptable cervical adenopathy,  CV: RRR, no m/r/g, no jvd Resp: Clear to auscultation bilaterally GI: abdomen is soft, non-tender, non-distended, normal bowel sounds, no hepatosplenomegaly MSK: extremities are warm, no edema.  Skin: warm, no rash Neuro:  no focal deficits Psych: appropriate affect    Assessment and Plan     1. Palpitations - long history, only medication she has tolerated is prn propanolol as outlined above - symptoms controlled,continue currentm eds   F/u 1 year   Arnoldo Lenis, M.D.

## 2021-03-26 DIAGNOSIS — M9903 Segmental and somatic dysfunction of lumbar region: Secondary | ICD-10-CM | POA: Diagnosis not present

## 2021-03-26 DIAGNOSIS — S134XXA Sprain of ligaments of cervical spine, initial encounter: Secondary | ICD-10-CM | POA: Diagnosis not present

## 2021-03-26 DIAGNOSIS — M47816 Spondylosis without myelopathy or radiculopathy, lumbar region: Secondary | ICD-10-CM | POA: Diagnosis not present

## 2021-03-26 DIAGNOSIS — M9902 Segmental and somatic dysfunction of thoracic region: Secondary | ICD-10-CM | POA: Diagnosis not present

## 2021-03-26 DIAGNOSIS — S233XXA Sprain of ligaments of thoracic spine, initial encounter: Secondary | ICD-10-CM | POA: Diagnosis not present

## 2021-03-26 DIAGNOSIS — M9901 Segmental and somatic dysfunction of cervical region: Secondary | ICD-10-CM | POA: Diagnosis not present

## 2021-04-01 DIAGNOSIS — S134XXA Sprain of ligaments of cervical spine, initial encounter: Secondary | ICD-10-CM | POA: Diagnosis not present

## 2021-04-01 DIAGNOSIS — S233XXA Sprain of ligaments of thoracic spine, initial encounter: Secondary | ICD-10-CM | POA: Diagnosis not present

## 2021-04-01 DIAGNOSIS — M9901 Segmental and somatic dysfunction of cervical region: Secondary | ICD-10-CM | POA: Diagnosis not present

## 2021-04-01 DIAGNOSIS — M47816 Spondylosis without myelopathy or radiculopathy, lumbar region: Secondary | ICD-10-CM | POA: Diagnosis not present

## 2021-04-01 DIAGNOSIS — M9903 Segmental and somatic dysfunction of lumbar region: Secondary | ICD-10-CM | POA: Diagnosis not present

## 2021-04-01 DIAGNOSIS — M9902 Segmental and somatic dysfunction of thoracic region: Secondary | ICD-10-CM | POA: Diagnosis not present

## 2021-04-02 DIAGNOSIS — L821 Other seborrheic keratosis: Secondary | ICD-10-CM | POA: Diagnosis not present

## 2021-04-02 DIAGNOSIS — Z85828 Personal history of other malignant neoplasm of skin: Secondary | ICD-10-CM | POA: Diagnosis not present

## 2021-04-02 DIAGNOSIS — L570 Actinic keratosis: Secondary | ICD-10-CM | POA: Diagnosis not present

## 2021-04-02 DIAGNOSIS — Z1283 Encounter for screening for malignant neoplasm of skin: Secondary | ICD-10-CM | POA: Diagnosis not present

## 2021-04-09 DIAGNOSIS — M9903 Segmental and somatic dysfunction of lumbar region: Secondary | ICD-10-CM | POA: Diagnosis not present

## 2021-04-09 DIAGNOSIS — M9902 Segmental and somatic dysfunction of thoracic region: Secondary | ICD-10-CM | POA: Diagnosis not present

## 2021-04-09 DIAGNOSIS — S233XXA Sprain of ligaments of thoracic spine, initial encounter: Secondary | ICD-10-CM | POA: Diagnosis not present

## 2021-04-09 DIAGNOSIS — S134XXA Sprain of ligaments of cervical spine, initial encounter: Secondary | ICD-10-CM | POA: Diagnosis not present

## 2021-04-09 DIAGNOSIS — M9901 Segmental and somatic dysfunction of cervical region: Secondary | ICD-10-CM | POA: Diagnosis not present

## 2021-04-09 DIAGNOSIS — M47816 Spondylosis without myelopathy or radiculopathy, lumbar region: Secondary | ICD-10-CM | POA: Diagnosis not present

## 2021-04-16 DIAGNOSIS — S233XXA Sprain of ligaments of thoracic spine, initial encounter: Secondary | ICD-10-CM | POA: Diagnosis not present

## 2021-04-16 DIAGNOSIS — M9901 Segmental and somatic dysfunction of cervical region: Secondary | ICD-10-CM | POA: Diagnosis not present

## 2021-04-16 DIAGNOSIS — S134XXA Sprain of ligaments of cervical spine, initial encounter: Secondary | ICD-10-CM | POA: Diagnosis not present

## 2021-04-16 DIAGNOSIS — M9903 Segmental and somatic dysfunction of lumbar region: Secondary | ICD-10-CM | POA: Diagnosis not present

## 2021-04-16 DIAGNOSIS — M9902 Segmental and somatic dysfunction of thoracic region: Secondary | ICD-10-CM | POA: Diagnosis not present

## 2021-04-16 DIAGNOSIS — M47816 Spondylosis without myelopathy or radiculopathy, lumbar region: Secondary | ICD-10-CM | POA: Diagnosis not present

## 2021-04-23 DIAGNOSIS — M9902 Segmental and somatic dysfunction of thoracic region: Secondary | ICD-10-CM | POA: Diagnosis not present

## 2021-04-23 DIAGNOSIS — M47816 Spondylosis without myelopathy or radiculopathy, lumbar region: Secondary | ICD-10-CM | POA: Diagnosis not present

## 2021-04-23 DIAGNOSIS — M9903 Segmental and somatic dysfunction of lumbar region: Secondary | ICD-10-CM | POA: Diagnosis not present

## 2021-04-23 DIAGNOSIS — S134XXA Sprain of ligaments of cervical spine, initial encounter: Secondary | ICD-10-CM | POA: Diagnosis not present

## 2021-04-23 DIAGNOSIS — S233XXA Sprain of ligaments of thoracic spine, initial encounter: Secondary | ICD-10-CM | POA: Diagnosis not present

## 2021-04-23 DIAGNOSIS — M9901 Segmental and somatic dysfunction of cervical region: Secondary | ICD-10-CM | POA: Diagnosis not present

## 2021-04-30 DIAGNOSIS — M9901 Segmental and somatic dysfunction of cervical region: Secondary | ICD-10-CM | POA: Diagnosis not present

## 2021-04-30 DIAGNOSIS — M9903 Segmental and somatic dysfunction of lumbar region: Secondary | ICD-10-CM | POA: Diagnosis not present

## 2021-04-30 DIAGNOSIS — M47816 Spondylosis without myelopathy or radiculopathy, lumbar region: Secondary | ICD-10-CM | POA: Diagnosis not present

## 2021-04-30 DIAGNOSIS — S233XXA Sprain of ligaments of thoracic spine, initial encounter: Secondary | ICD-10-CM | POA: Diagnosis not present

## 2021-04-30 DIAGNOSIS — S134XXA Sprain of ligaments of cervical spine, initial encounter: Secondary | ICD-10-CM | POA: Diagnosis not present

## 2021-04-30 DIAGNOSIS — M9902 Segmental and somatic dysfunction of thoracic region: Secondary | ICD-10-CM | POA: Diagnosis not present

## 2021-05-02 ENCOUNTER — Telehealth (INDEPENDENT_AMBULATORY_CARE_PROVIDER_SITE_OTHER): Payer: Self-pay | Admitting: *Deleted

## 2021-05-02 NOTE — Telephone Encounter (Signed)
Pt having diarrhea for several weeks for the past couple of months. Has appt 10/18 with dr Laural Golden. Wanted to have some blood work to see if she has alpha gal. Having about two epidsodes a day of diarrhea. No blood or mucus. No abdominal pain. Started a couple of months ago. Seems to happen about eating beef. Off and on does not happen every day. Also noticed it when she eats sweets and acidic foods. States that has been since she was born. Pt aware dr Laural Golden is out of office til 9/6 and if symptoms change to call back. If having abdominal pain, fever, blood in stool then call pcp

## 2021-05-03 ENCOUNTER — Other Ambulatory Visit (INDEPENDENT_AMBULATORY_CARE_PROVIDER_SITE_OTHER): Payer: Self-pay | Admitting: *Deleted

## 2021-05-03 DIAGNOSIS — R197 Diarrhea, unspecified: Secondary | ICD-10-CM

## 2021-05-07 ENCOUNTER — Other Ambulatory Visit (INDEPENDENT_AMBULATORY_CARE_PROVIDER_SITE_OTHER): Payer: Self-pay | Admitting: *Deleted

## 2021-05-07 DIAGNOSIS — R197 Diarrhea, unspecified: Secondary | ICD-10-CM | POA: Diagnosis not present

## 2021-05-07 NOTE — Telephone Encounter (Signed)
Per dr Laural Golden order alpha gal, gi pathogen, and c diff. Pt needs to do when stool is loose. Orders put in and pt was notified.

## 2021-05-09 DIAGNOSIS — E559 Vitamin D deficiency, unspecified: Secondary | ICD-10-CM | POA: Diagnosis not present

## 2021-05-09 DIAGNOSIS — Z8619 Personal history of other infectious and parasitic diseases: Secondary | ICD-10-CM | POA: Diagnosis not present

## 2021-05-09 DIAGNOSIS — R197 Diarrhea, unspecified: Secondary | ICD-10-CM | POA: Diagnosis not present

## 2021-05-09 DIAGNOSIS — J302 Other seasonal allergic rhinitis: Secondary | ICD-10-CM | POA: Diagnosis not present

## 2021-05-09 DIAGNOSIS — R42 Dizziness and giddiness: Secondary | ICD-10-CM | POA: Diagnosis not present

## 2021-05-09 DIAGNOSIS — R002 Palpitations: Secondary | ICD-10-CM | POA: Diagnosis not present

## 2021-05-09 DIAGNOSIS — Z6821 Body mass index (BMI) 21.0-21.9, adult: Secondary | ICD-10-CM | POA: Diagnosis not present

## 2021-05-09 DIAGNOSIS — Z7689 Persons encountering health services in other specified circumstances: Secondary | ICD-10-CM | POA: Diagnosis not present

## 2021-05-10 LAB — ALPHA-GAL PANEL
Beef IgE: <0.1 kU/L (ref <0.35)
Class: 0
Class: 0
Class: 0
Galactose-alpha-1,3-galactose IgE: <0.1 kU/L (ref <0.10)
LAMB/MUTTON IGE: <0.1 kU/L (ref <0.35)
Pork IgE: <0.1 kU/L (ref <0.35)

## 2021-05-10 LAB — C. DIFFICILE GDH AND TOXIN A/B
GDH ANTIGEN: NOT DETECTED
MICRO NUMBER:: 12349824
SPECIMEN QUALITY:: ADEQUATE
TOXIN A AND B: NOT DETECTED

## 2021-05-14 DIAGNOSIS — R197 Diarrhea, unspecified: Secondary | ICD-10-CM | POA: Diagnosis not present

## 2021-05-15 DIAGNOSIS — M9903 Segmental and somatic dysfunction of lumbar region: Secondary | ICD-10-CM | POA: Diagnosis not present

## 2021-05-15 DIAGNOSIS — S233XXA Sprain of ligaments of thoracic spine, initial encounter: Secondary | ICD-10-CM | POA: Diagnosis not present

## 2021-05-15 DIAGNOSIS — M9902 Segmental and somatic dysfunction of thoracic region: Secondary | ICD-10-CM | POA: Diagnosis not present

## 2021-05-15 DIAGNOSIS — M9901 Segmental and somatic dysfunction of cervical region: Secondary | ICD-10-CM | POA: Diagnosis not present

## 2021-05-15 DIAGNOSIS — M47816 Spondylosis without myelopathy or radiculopathy, lumbar region: Secondary | ICD-10-CM | POA: Diagnosis not present

## 2021-05-15 DIAGNOSIS — S134XXA Sprain of ligaments of cervical spine, initial encounter: Secondary | ICD-10-CM | POA: Diagnosis not present

## 2021-05-17 LAB — GASTROINTESTINAL PATHOGEN PANEL PCR
C. difficile Tox A/B, PCR: NOT DETECTED
Campylobacter, PCR: NOT DETECTED
Cryptosporidium, PCR: NOT DETECTED
E coli (ETEC) LT/ST PCR: NOT DETECTED
E coli (STEC) stx1/stx2, PCR: NOT DETECTED
E coli 0157, PCR: NOT DETECTED
Giardia lamblia, PCR: NOT DETECTED
Norovirus, PCR: NOT DETECTED
Rotavirus A, PCR: NOT DETECTED
Salmonella, PCR: NOT DETECTED
Shigella, PCR: NOT DETECTED

## 2021-05-21 DIAGNOSIS — M47816 Spondylosis without myelopathy or radiculopathy, lumbar region: Secondary | ICD-10-CM | POA: Diagnosis not present

## 2021-05-21 DIAGNOSIS — M9903 Segmental and somatic dysfunction of lumbar region: Secondary | ICD-10-CM | POA: Diagnosis not present

## 2021-05-21 DIAGNOSIS — S233XXA Sprain of ligaments of thoracic spine, initial encounter: Secondary | ICD-10-CM | POA: Diagnosis not present

## 2021-05-21 DIAGNOSIS — M9901 Segmental and somatic dysfunction of cervical region: Secondary | ICD-10-CM | POA: Diagnosis not present

## 2021-05-21 DIAGNOSIS — S134XXA Sprain of ligaments of cervical spine, initial encounter: Secondary | ICD-10-CM | POA: Diagnosis not present

## 2021-05-21 DIAGNOSIS — M9902 Segmental and somatic dysfunction of thoracic region: Secondary | ICD-10-CM | POA: Diagnosis not present

## 2021-06-03 DIAGNOSIS — M9902 Segmental and somatic dysfunction of thoracic region: Secondary | ICD-10-CM | POA: Diagnosis not present

## 2021-06-03 DIAGNOSIS — S134XXA Sprain of ligaments of cervical spine, initial encounter: Secondary | ICD-10-CM | POA: Diagnosis not present

## 2021-06-03 DIAGNOSIS — M9901 Segmental and somatic dysfunction of cervical region: Secondary | ICD-10-CM | POA: Diagnosis not present

## 2021-06-03 DIAGNOSIS — M9903 Segmental and somatic dysfunction of lumbar region: Secondary | ICD-10-CM | POA: Diagnosis not present

## 2021-06-03 DIAGNOSIS — M47816 Spondylosis without myelopathy or radiculopathy, lumbar region: Secondary | ICD-10-CM | POA: Diagnosis not present

## 2021-06-03 DIAGNOSIS — S233XXA Sprain of ligaments of thoracic spine, initial encounter: Secondary | ICD-10-CM | POA: Diagnosis not present

## 2021-06-18 ENCOUNTER — Other Ambulatory Visit: Payer: Self-pay

## 2021-06-18 ENCOUNTER — Ambulatory Visit (INDEPENDENT_AMBULATORY_CARE_PROVIDER_SITE_OTHER): Payer: Medicare Other | Admitting: Internal Medicine

## 2021-06-18 ENCOUNTER — Encounter (INDEPENDENT_AMBULATORY_CARE_PROVIDER_SITE_OTHER): Payer: Self-pay | Admitting: Internal Medicine

## 2021-06-18 DIAGNOSIS — Z8719 Personal history of other diseases of the digestive system: Secondary | ICD-10-CM

## 2021-06-18 DIAGNOSIS — Z87898 Personal history of other specified conditions: Secondary | ICD-10-CM | POA: Diagnosis not present

## 2021-06-18 NOTE — Progress Notes (Signed)
Presenting complaint;  Follow for diarrhea.  Database and subjective:  Patient is 80 year old Caucasian female who is here for scheduled visit.  She was last seen 1 year ago. 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 Cyndie Chime in September 2019.  It is interesting to note that her husband had seizures before she did. Patient called our office last month with complaints of diarrhea.  She recalls that she got sick after eating fried chicken at a [employer].  Because of her history she was very worried as C. difficile was back.  She had stool studies consisting of C. difficile antigen and toxin as well as GI pathogen panel and the studies were negative.  We recommend symptomatic therapy.  Diarrhea has resolved.  On most days she has 1 formed stool.  Occasionally she may have 2.  She denies nocturnal bowel movements.  Sometimes she has urgency if she eats pork or beef.  For this reason be screening her for alpha gal allergy and the test was negative. Her appetite is good.  She is very active.  She walks 12 to 14 miles per week.  On most days she walks 2 miles. 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  Current Medications: Outpatient Encounter Medications as of 06/18/2021  Medication Sig   Calcium Carb-Cholecalciferol (CALCIUM + VITAMIN D3 PO) Take 1 tablet by mouth 2 (two) times daily.   Cholecalciferol (VITAMIN D-3) 125 MCG (5000 UT) TABS Take 1 tablet by mouth daily.   fluticasone (FLONASE) 50 MCG/ACT nasal spray PLACE 2 SPRAYS IN BOTH NOSTRILS DAILY.   meclizine (ANTIVERT) 12.5 MG tablet Take 1 tablet (12.5 mg total) by mouth 3 (three) times daily as needed for dizziness.   propranolol (INDERAL) 60 MG tablet Take 1 tablet (60 mg total) by mouth 2 (two) times daily as needed.   vitamin B-12 (CYANOCOBALAMIN) 1000 MCG tablet Take 1,000 mcg by mouth daily.   No facility-administered encounter  medications on file as of 06/18/2021.    Objective: Pulse 79/min, blood pressure right arm sitting 153/84, temp is 98.79F, respirations 18 Weight 138 pounds, height 5 feet 4 inches Patient is alert and in no acute distress. Conjunctiva is pink. Sclera is nonicteric Oropharyngeal mucosa is normal. No neck masses or thyromegaly noted. Cardiac exam with regular rhythm normal S1 and S2. No murmur or gallop noted. Lungs are clear to auscultation. Abdomen is symmetrical.  Bowel sounds are hyperactive.  On palpation abdomen is soft and nontender with organomegaly or masses. No LE edema or clubbing noted.  Labs/studies Results:   CBC Latest Ref Rng & Units 05/17/2020 06/15/2017 06/19/2015  WBC 4.0 - 10.5 K/uL 9.2 6.9 7.4  Hemoglobin 12.0 - 15.0 g/dL 15.8(H) 15.0 15.1(H)  Hematocrit 36.0 - 46.0 % 49.4(H) 45.4 44.3  Platelets 150 - 400 K/uL 275 209 244    CMP Latest Ref Rng & Units 11/19/2020 07/02/2020 05/17/2020  Glucose 65 - 139 mg/dL 81 101(H) 111(H)  BUN 7 - 25 mg/dL 28(H) 23 18  Creatinine 0.60 - 0.93 mg/dL 0.84 0.90 0.97  Sodium 135 - 146 mmol/L 142 141 139  Potassium 3.5 - 5.3 mmol/L 4.3 5.4(H) 4.9  Chloride 98 - 110 mmol/L 105 104 101  CO2 20 - 32 mmol/L _0 Calcium 8.6 - 10.4 mg/dL 9.1 10.0 9.3  Total Protein 6.1 - 8.1 g/dL 6.3 6.5 -  Total Bilirubin 0.2 - 1.2 mg/dL 0.5 0.7 -  Alkaline Phos 33 - 130 U/L - - -  AST 10 - 35 U/L 17 19 -  ALT 6 - 29 U/L 15 15 -    Hepatic Function Latest Ref Rng & Units 11/19/2020 07/02/2020 06/23/2019  Total Protein 6.1 - 8.1 g/dL 6.3 6.5 6.5  Albumin 3.6 - 5.1 g/dL - - -  AST 10 - 35 U/L _0 ALT 6 - 29 U/L _1 Alk Phosphatase 33 - 130 U/L - - -  Total Bilirubin 0.2 - 1.2 mg/dL 0.5 0.7 0.5  Bilirubin, Direct 0.0 - 0.3 mg/dL - - -    Lab data from 05/14/2021 Alpha gal panel negative C. difficile [facility] antigen and toxin negative GI pathogen panel negative.  Assessment:  #1.  Recent bout of diarrhea appears to be foodborne  illness.  Enteric infection as well as C. difficile colitis we will do advise stool studies.  She remains extremely concerned about C. difficile colitis for which she had to have FMT back in September 2019. She does not need anymore at this time.  Plan:  Patient reassured. Patient can use loperamide OTC if she has diarrhea without fever or rectal bleeding.  However if diarrhea lasts for more than 3 to 4 days then she should call office. Office visit in 1 year.

## 2021-06-18 NOTE — Patient Instructions (Signed)
Can use Imodium/loperamide OTC for diarrhea for few days as long as she do not have fever or rectal bleeding.  If diarrhea lasts for more than 3 to 4 days please notify.

## 2021-09-04 ENCOUNTER — Ambulatory Visit (INDEPENDENT_AMBULATORY_CARE_PROVIDER_SITE_OTHER): Payer: Medicare Other | Admitting: Internal Medicine

## 2021-09-26 ENCOUNTER — Telehealth (INDEPENDENT_AMBULATORY_CARE_PROVIDER_SITE_OTHER): Payer: Self-pay | Admitting: *Deleted

## 2021-09-26 NOTE — Telephone Encounter (Signed)
Having a root canal on 1/31. Patient states she was told not to be on antibiotic if she does not have to be. Does not want c diff again. Pt not sure if she will need antibiotic but wanted to ask dr Laural Golden if she did what would be safe.

## 2021-09-27 NOTE — Telephone Encounter (Signed)
Per dr Laural Golden should not need an antibiotic for root canal but if so do NOT take clindamycin. And take a probiotic and eat a lot of yogurt. Patient verbalized understanding of all.

## 2021-10-16 DIAGNOSIS — J302 Other seasonal allergic rhinitis: Secondary | ICD-10-CM | POA: Diagnosis not present

## 2021-10-16 DIAGNOSIS — Z8619 Personal history of other infectious and parasitic diseases: Secondary | ICD-10-CM | POA: Diagnosis not present

## 2021-10-16 DIAGNOSIS — Z13220 Encounter for screening for lipoid disorders: Secondary | ICD-10-CM | POA: Diagnosis not present

## 2021-10-16 DIAGNOSIS — R002 Palpitations: Secondary | ICD-10-CM | POA: Diagnosis not present

## 2021-10-16 DIAGNOSIS — Z7689 Persons encountering health services in other specified circumstances: Secondary | ICD-10-CM | POA: Diagnosis not present

## 2021-10-16 DIAGNOSIS — Z23 Encounter for immunization: Secondary | ICD-10-CM | POA: Diagnosis not present

## 2021-10-16 DIAGNOSIS — R42 Dizziness and giddiness: Secondary | ICD-10-CM | POA: Diagnosis not present

## 2021-10-16 DIAGNOSIS — E559 Vitamin D deficiency, unspecified: Secondary | ICD-10-CM | POA: Diagnosis not present

## 2021-10-16 DIAGNOSIS — Z6822 Body mass index (BMI) 22.0-22.9, adult: Secondary | ICD-10-CM | POA: Diagnosis not present

## 2021-10-16 DIAGNOSIS — R7309 Other abnormal glucose: Secondary | ICD-10-CM | POA: Diagnosis not present

## 2021-10-31 ENCOUNTER — Other Ambulatory Visit: Payer: Self-pay | Admitting: Internal Medicine

## 2021-10-31 DIAGNOSIS — Z1231 Encounter for screening mammogram for malignant neoplasm of breast: Secondary | ICD-10-CM

## 2021-11-04 DIAGNOSIS — Z0001 Encounter for general adult medical examination with abnormal findings: Secondary | ICD-10-CM | POA: Diagnosis not present

## 2021-11-04 DIAGNOSIS — Z6822 Body mass index (BMI) 22.0-22.9, adult: Secondary | ICD-10-CM | POA: Diagnosis not present

## 2021-11-04 DIAGNOSIS — R002 Palpitations: Secondary | ICD-10-CM | POA: Diagnosis not present

## 2021-11-04 DIAGNOSIS — E559 Vitamin D deficiency, unspecified: Secondary | ICD-10-CM | POA: Diagnosis not present

## 2021-11-04 DIAGNOSIS — R42 Dizziness and giddiness: Secondary | ICD-10-CM | POA: Diagnosis not present

## 2021-11-04 DIAGNOSIS — J302 Other seasonal allergic rhinitis: Secondary | ICD-10-CM | POA: Diagnosis not present

## 2021-11-11 ENCOUNTER — Other Ambulatory Visit: Payer: Self-pay | Admitting: Internal Medicine

## 2021-11-11 DIAGNOSIS — Z13820 Encounter for screening for osteoporosis: Secondary | ICD-10-CM

## 2021-11-14 ENCOUNTER — Ambulatory Visit
Admission: RE | Admit: 2021-11-14 | Discharge: 2021-11-14 | Disposition: A | Discharge status: Home / Self Care | Payer: Medicare Other | Source: Ambulatory Visit | Attending: Internal Medicine | Admitting: Internal Medicine

## 2021-11-14 ENCOUNTER — Other Ambulatory Visit: Payer: Self-pay

## 2021-11-14 DIAGNOSIS — M85832 Other specified disorders of bone density and structure, left forearm: Secondary | ICD-10-CM | POA: Diagnosis not present

## 2021-11-14 DIAGNOSIS — M810 Age-related osteoporosis without current pathological fracture: Secondary | ICD-10-CM | POA: Diagnosis not present

## 2021-11-14 DIAGNOSIS — Z780 Asymptomatic menopausal state: Secondary | ICD-10-CM | POA: Diagnosis not present

## 2021-11-18 DIAGNOSIS — Z0001 Encounter for general adult medical examination with abnormal findings: Secondary | ICD-10-CM | POA: Diagnosis not present

## 2021-11-20 DIAGNOSIS — Z1231 Encounter for screening mammogram for malignant neoplasm of breast: Secondary | ICD-10-CM | POA: Diagnosis not present

## 2022-01-06 ENCOUNTER — Encounter: Payer: Self-pay | Admitting: Cardiology

## 2022-01-06 DIAGNOSIS — Z0001 Encounter for general adult medical examination with abnormal findings: Secondary | ICD-10-CM | POA: Diagnosis not present

## 2022-01-06 DIAGNOSIS — Z13220 Encounter for screening for lipoid disorders: Secondary | ICD-10-CM | POA: Diagnosis not present

## 2022-01-06 DIAGNOSIS — Z13228 Encounter for screening for other metabolic disorders: Secondary | ICD-10-CM | POA: Diagnosis not present

## 2022-02-25 DIAGNOSIS — H43393 Other vitreous opacities, bilateral: Secondary | ICD-10-CM | POA: Diagnosis not present

## 2022-03-12 ENCOUNTER — Ambulatory Visit: Payer: Medicare Other | Admitting: Orthopaedic Surgery

## 2022-03-19 ENCOUNTER — Ambulatory Visit: Payer: Medicare Other | Admitting: Orthopaedic Surgery

## 2022-03-24 ENCOUNTER — Ambulatory Visit: Payer: Medicare Other | Admitting: Cardiology

## 2022-03-24 ENCOUNTER — Encounter: Payer: Self-pay | Admitting: *Deleted

## 2022-03-24 ENCOUNTER — Encounter: Payer: Self-pay | Admitting: Cardiology

## 2022-03-24 VITALS — BP 124/70 | HR 71 | Ht 64.0 in | Wt 145.0 lb

## 2022-03-24 DIAGNOSIS — R002 Palpitations: Secondary | ICD-10-CM

## 2022-03-24 MED ORDER — PROPRANOLOL HCL 60 MG PO TABS: 60.0000 mg | ORAL_TABLET | Freq: Two times a day (BID) | ORAL | 6 refills | Status: DC | PRN

## 2022-03-24 NOTE — Progress Notes (Signed)
Clinical Summary Ms. Barritt is a 81 y.o.female seen today for follow up of the following medical problems.   1. Palpitations/Dizziness - previous holter 05/2009 showed no significant arrhythmias.   - during prior admission did have short episode of PSVT - she was changed from propanolol to atenolol. With change had increase in symptoms and she changed herself back to propanolol - she reports short acting propanolol does not work as well, prefers long acting.    - diltiazem caused low bp's at home per her report, she stopped taking.     - tried midodine x 2 weeks, SBP 130s. Reports insomnia and she stopped taking. We had tried to see if could stabilize her bp to allow more stable use of her beta blocker   - previously she was on an unconvnetional way of taking propranolol long acting that she has done for many years, she splits the individual capsule into 2 separate doses. We have tried several other doses and meds which she did not tolerate and thus have accepted this atypical regimen. - however most recentlyt has been on the immediate release propranolol    - reports symptoms overall doing well. 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     Cholecalciferol (VITAMIN D-3) 125 MCG (5000 UT) TABS  Take 1 tablet by mouth daily.     fluticasone (FLONASE) 50 MCG/ACT nasal spray PLACE 2 SPRAYS IN BOTH NOSTRILS DAILY. 16 g 3   meclizine (ANTIVERT) 12.5 MG tablet Take 1 tablet (12.5 mg total) by mouth 3 (three) times daily as needed for dizziness. 30 tablet 2   propranolol (INDERAL) 60 MG tablet Take 1 tablet (60 mg total) by mouth 2 (two) times daily as needed. 60 tablet 6   vitamin B-12 (CYANOCOBALAMIN) 1000 MCG tablet Take 1,000 mcg by mouth daily.     No current facility-administered medications for this visit.     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   Procedure: FECAL TRANSPLANT;  Surgeon: Gatha Mayer, MD;  Location: [facility];  Service: Endoscopy;;   KNEE ARTHROSCOPY Right    Precancerous  Tissue - Nose  February 14-2014     Allergies  Allergen Reactions   Hydrocodone Nausea And Vomiting   Prednisone Other (See Comments)    Heart races      Family History  Problem Relation Age of Onset  Lymphoma Mother    Colon cancer Mother    Hypertension Mother    Migraines Mother    Other Father        Wagner's Granulermatosis   Seizures Sister    Cancer Maternal Grandmother        brain   Cancer Maternal Grandfather        brain   Other Paternal Grandmother        blood clot - DVT following surgery   Alcohol abuse Paternal Grandfather    Cirrhosis Paternal Grandfather    Thyroid disease Sister    Heart disease Sister      Social History Ms. Car reports that she has never smoked. She has never used smokeless tobacco. Ms. Bump reports no history of alcohol use.   Review of Systems CONSTITUTIONAL: No weight loss, fever, chills,  weakness or fatigue.  HEENT: Eyes: No visual loss, blurred vision, double vision or yellow sclerae.No hearing loss, sneezing, congestion, runny nose or sore throat.  SKIN: No rash or itching.  CARDIOVASCULAR: per hpi RESPIRATORY: No shortness of breath, cough or sputum.  GASTROINTESTINAL: No anorexia, nausea, vomiting or diarrhea. No abdominal pain or blood.  GENITOURINARY: No burning on urination, no polyuria NEUROLOGICAL: No headache, dizziness, syncope, paralysis, ataxia, numbness or tingling in the extremities. No change in bowel or bladder control.  MUSCULOSKELETAL: No muscle, back pain, joint pain or stiffness.  LYMPHATICS: No enlarged nodes. No history of splenectomy.  PSYCHIATRIC: No history of depression or anxiety.  ENDOCRINOLOGIC: No reports of sweating, cold or heat intolerance. No polyuria or polydipsia.  Marland Kitchen   Physical Examination Today's Vitals   03/24/22 1532  BP: (!) 144/76  Pulse: 71  SpO2: 95%  Weight: 145 lb (65.8 kg)  Height: '5\' 4"'$  (1.626 m)   Body mass index is 24.89 kg/m.  Gen: resting comfortably, no acute distress HEENT: no scleral icterus, pupils equal round and reactive, no palptable cervical adenopathy,  CV: RRR, no m/r/g no jvd Resp: Clear to auscultation bilaterally GI: abdomen is soft, non-tender, non-distended, normal bowel sounds, no hepatosplenomegaly MSK: extremities are warm, no edema.  Skin: warm, no rash Neuro:  no focal deficits Psych: appropriate affect     Assessment and Plan   1. Palpitations - long history, only medication she has tolerated is prn propanolol as outlined above - overall doing well, continue current meds - EKG today shows SR, LAFB, PACs  F/u 1 year  Arnoldo Lenis, M.D.

## 2022-03-24 NOTE — Patient Instructions (Addendum)

## 2022-03-25 DIAGNOSIS — L570 Actinic keratosis: Secondary | ICD-10-CM | POA: Diagnosis not present

## 2022-04-09 ENCOUNTER — Telehealth: Payer: Self-pay

## 2022-04-09 NOTE — Telephone Encounter (Signed)
Patient endodontist office called stating that patient has upper tooth residual infection and with her having extensive GI hx of c-diff. They wanted to know if Dr.Snider could send antibiotic in for patient or call them with her recommendation. Endodontist are trying to avoid patient having to get oral surgery. Patient wanted Endodontist to consult with Dr.Snider before prescribing antibiotic.  Call back number Tonya at [facility] office - [place], [place]

## 2022-04-11 NOTE — Telephone Encounter (Signed)
Left voicemail at [place] office asking Kenney Houseman to return my call.   [place], CMA

## 2022-04-14 NOTE — Telephone Encounter (Signed)
Spoke with patient and Endodontist office regarding providers response.  Did not have any questions at this time. Patient is aware she will need to schedule appointment if any additional concerns arise.  Leatrice Jewels, RMA

## 2022-04-17 ENCOUNTER — Other Ambulatory Visit: Payer: Self-pay

## 2022-04-17 ENCOUNTER — Encounter: Payer: Self-pay | Admitting: Internal Medicine

## 2022-04-17 ENCOUNTER — Ambulatory Visit: Payer: Medicare Other | Admitting: Internal Medicine

## 2022-04-17 VITALS — BP 142/80 | HR 67 | Temp 97.8°F | Ht 64.0 in | Wt 140.0 lb

## 2022-04-17 DIAGNOSIS — Z8619 Personal history of other infectious and parasitic diseases: Secondary | ICD-10-CM

## 2022-04-17 NOTE — Progress Notes (Signed)
[facility]      Reason for Consult: concern for perioral infection    Referring Physician: Dr. Jesusita Oka    Patient ID: Rhonda Andrews, female    DOB: 1941/03/18, 81 y.o.   MRN: [id number]  HPI:   She is here for evaluation for a concern for infection post root-canal surgery. She is followed by Dr. Jesusita Oka and underwent a root canal in January of this year.  She had some pain initially after the procedure but otherwise reports doing well.  She went for follow up earlier this month and noted to have a continued pocket at the area of the root canal and a residual infection and needed an antibiotic.  She was prescribed amoxicillin but has not taken it.  She is having no new pain, swelling or concerns in the area.  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 Authorizing Provider  Calcium Carb-Cholecalciferol (CALCIUM + VITAMIN D3 PO) Take 1 tablet by mouth 2 (two) times daily.   Yes [provider]  Cholecalciferol (VITAMIN D-3) 125 MCG (5000 UT) TABS Take 1 tablet by mouth daily.   Yes [provider]  fluticasone (FLONASE) 50 MCG/ACT nasal spray PLACE 2 SPRAYS IN BOTH NOSTRILS DAILY. 11/21/20  Yes Gosrani, Nimish C, MD  meclizine (ANTIVERT) 12.5 MG tablet Take 1 tablet (12.5 mg total) by mouth 3 (three) times daily as needed for dizziness. 04/17/20  Yes Ailene Ards, NP  propranolol  (INDERAL) 60 MG tablet Take 1 tablet (60 mg total) by mouth 2 (two) times daily as needed. 03/24/22  Yes Branch, Alphonse Guild, MD  vitamin B-12 (CYANOCOBALAMIN) 1000 MCG tablet Take 1,000 mcg by mouth daily.   Yes [provider]    Allergies  Allergen Reactions   Hydrocodone Nausea And Vomiting   Prednisone Other (See Comments)    Heart races    Social History   Tobacco Use   Smoking status: Never   Smokeless tobacco: Never  Vaping Use   Vaping Use: Never used  Substance Use Topics   Alcohol use: No    Alcohol/week: 0.0 standard drinks of alcohol   Drug use: No    Family History  Problem Relation Age of Onset   Lymphoma Mother    Colon cancer Mother    Hypertension Mother    Migraines Mother    Other Father        Wagner's Granulermatosis   Seizures Sister    Cancer Maternal Grandmother        brain   Cancer Maternal Grandfather        brain   Other Paternal Grandmother        blood clot - DVT following surgery   Alcohol abuse Paternal Grandfather    Cirrhosis Paternal Grandfather    Thyroid disease Sister    Heart disease Sister  Review of Systems  Constitutional: negative for fevers and chills All other systems reviewed and are negative    Constitutional: in no apparent distress  Vitals:   04/17/22 1057  BP: (!) 142/80  Pulse: 67  Temp: 97.8 F (36.6 C)   EYES: anicteric ENMT: gum area examined and there is no drainage, no gum inflammation, no erythema, no tenderness externally Respiratory: normal respiratory effort  Labs: Lab Results  Component Value Date   WBC 9.2 05/17/2020   HGB 15.8 (H) 05/17/2020   HCT 49.4 (H) 05/17/2020   MCV 91.3 05/17/2020   PLT 275 05/17/2020    Lab Results  Component Value Date   CREATININE 0.84 11/19/2020   BUN 28 (H) 11/19/2020   NA 142 11/19/2020   K 4.3 11/19/2020   CL 105 11/19/2020   CO2 27 11/19/2020    Lab Results  Component Value Date   ALT 15 11/19/2020   AST 17 11/19/2020   ALKPHOS  65 06/19/2015   BILITOT 0.5 11/19/2020     Assessment: perioral pocket.  At this time, I do not see any signs of infection on exam with no gum inflammation, no drainage.  According to her report, she has had no change in symptoms since her initial surgery including no pain, no inflammation or other concerns.   I would not think she would have an ongoing infection for 6 months from her initial surgery and I do not appreciate anything at this time.  If infection of concern in the pocket, she could consider 3-5 days of oral amoxicillin.    Plan: 1)  continue to observe and treat with amoxicillin, if/when needed

## 2022-04-29 ENCOUNTER — Telehealth (INDEPENDENT_AMBULATORY_CARE_PROVIDER_SITE_OTHER): Payer: Self-pay

## 2022-04-29 NOTE — Telephone Encounter (Signed)
C. Difficile testing is not warranted unless she develops watery diarrhea >4 times per day. I would discourage testing. She can take some OTC probiotics after finishing her antibiotics.

## 2022-04-29 NOTE — Telephone Encounter (Signed)
Patient called today worried about a re - occurrence of C diff happening to her. Patient states she had C diff three years ago and had a fecal transplant done by Dr. Wilder Glade [facility]. Patient worried that she may contract this again as she was recently placed on Amoxicillin 500 mg TID for five days, with her finishing the medication on 04/23/2022, which is when she says she started having diarrhea. (Per patient she only took the 500 mg TID for three days, then she took 500 mg Bid for the last two days, so not finishing as she says Dr. Wilder Glade at [facility] told her to take that way). She says she has one loose stool per day and usually two solid stools per day,but she takes imodium daily also.She says there is no different odor to the stool to make her think she has C Diff again, but patient wants to know if this warrants her to just do a C Diff stool test to make sure as she says with her past history she would rather find it early on, than to have to suffer with that type infection again.

## 2022-04-30 NOTE — Telephone Encounter (Signed)
I called and spoke with the patient she states understanding.

## 2022-05-26 ENCOUNTER — Telehealth (INDEPENDENT_AMBULATORY_CARE_PROVIDER_SITE_OTHER): Payer: Self-pay

## 2022-05-26 ENCOUNTER — Other Ambulatory Visit (INDEPENDENT_AMBULATORY_CARE_PROVIDER_SITE_OTHER): Payer: Self-pay

## 2022-05-26 DIAGNOSIS — R197 Diarrhea, unspecified: Secondary | ICD-10-CM

## 2022-05-26 DIAGNOSIS — Z8619 Personal history of other infectious and parasitic diseases: Secondary | ICD-10-CM

## 2022-05-26 DIAGNOSIS — K6389 Other specified diseases of intestine: Secondary | ICD-10-CM

## 2022-05-26 DIAGNOSIS — A0471 Enterocolitis due to Clostridium difficile, recurrent: Secondary | ICD-10-CM

## 2022-05-26 NOTE — Telephone Encounter (Signed)
Patient called today with concerns of a cdiff infection. Patient wants to be tested for this (See encounter dated 04/29/2022). Patient says she is now having up to 7 loose stools per day and has been taking imodium. She says she has had 4 loose stools this am and has taken two otc imodium's today that has not helped. Patient denies any other gi issue. Patient has had a history of cdiff infection and went through with a fecal transplant by Dr. Wilder Glade [facility]. Please advise, May I order her a cdiff testing?

## 2022-05-26 NOTE — Telephone Encounter (Signed)
Yes, please arrange C diff [facility], Toxin A/B at [facility].

## 2022-05-26 NOTE — Telephone Encounter (Signed)
Patient made aware she will pick up lab order from our front desk on 05/27/2022 and will go to [facility] to pick up her containers for collection.

## 2022-05-27 DIAGNOSIS — R197 Diarrhea, unspecified: Secondary | ICD-10-CM | POA: Diagnosis not present

## 2022-05-27 DIAGNOSIS — K6389 Other specified diseases of intestine: Secondary | ICD-10-CM | POA: Diagnosis not present

## 2022-05-27 DIAGNOSIS — Z8619 Personal history of other infectious and parasitic diseases: Secondary | ICD-10-CM | POA: Diagnosis not present

## 2022-05-29 ENCOUNTER — Telehealth: Payer: Self-pay

## 2022-05-29 ENCOUNTER — Encounter (INDEPENDENT_AMBULATORY_CARE_PROVIDER_SITE_OTHER): Payer: Self-pay | Admitting: Gastroenterology

## 2022-05-29 LAB — C. DIFFICILE GDH AND TOXIN A/B
GDH ANTIGEN: DETECTED
MICRO NUMBER:: 13974629
SPECIMEN QUALITY:: ADEQUATE
TOXIN A AND B: NOT DETECTED

## 2022-05-29 LAB — CLOSTRIDIUM DIFFICILE TOXIN B, QUALITATIVE, REAL-TIME PCR: Toxigenic C. Difficile by PCR: NOT DETECTED

## 2022-05-29 NOTE — Telephone Encounter (Signed)
Patient called office today requesting advise from Dr. Baxter Flattery regarding c diff concerns. States that she was started on an antibiotic  in August and experience diarrhea. This is off and on. Is taking Imodium which helps a little.  Has reached out to [facility] provider and was tested for c diff on 9/26. Will forward message to provider for advise. Leatrice Jewels, RMA

## 2022-06-19 ENCOUNTER — Ambulatory Visit (INDEPENDENT_AMBULATORY_CARE_PROVIDER_SITE_OTHER): Payer: Medicare Other | Admitting: Gastroenterology

## 2022-08-14 ENCOUNTER — Encounter (INDEPENDENT_AMBULATORY_CARE_PROVIDER_SITE_OTHER): Payer: Self-pay | Admitting: Gastroenterology

## 2022-08-14 ENCOUNTER — Ambulatory Visit (INDEPENDENT_AMBULATORY_CARE_PROVIDER_SITE_OTHER): Payer: Medicare Other | Admitting: Gastroenterology

## 2022-08-14 VITALS — BP 123/81 | HR 72 | Temp 97.8°F | Ht 64.0 in | Wt 138.6 lb

## 2022-08-14 DIAGNOSIS — K219 Gastro-esophageal reflux disease without esophagitis: Secondary | ICD-10-CM

## 2022-08-14 DIAGNOSIS — K580 Irritable bowel syndrome with diarrhea: Secondary | ICD-10-CM | POA: Diagnosis not present

## 2022-08-14 DIAGNOSIS — Z8619 Personal history of other infectious and parasitic diseases: Secondary | ICD-10-CM | POA: Diagnosis not present

## 2022-08-14 MED ORDER — FAMOTIDINE 20 MG PO TABS: 20.0000 mg | ORAL_TABLET | Freq: Every day | ORAL | 3 refills | Status: AC

## 2022-08-14 NOTE — Progress Notes (Signed)
[name], M.D. Gastroenterology & Hepatology [facility] [address] [place], [place] [address]  Primary Care Physician: Philip Nation, MD [place] [id number]  I will communicate my assessment and recommendations to the referring MD via EMR.  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 female with PMH C. Diff colitis s/p stool transplant, IBS-D, anxiety, hypertension, diabetes, who presents for follow up of due to history of colitis and GERD.  The patient was last seen on 06/18/2021. At that time, the patient was advised to take Imodium as needed.  Patient reports that she has had a chronic history of diarrhea for multiple years since she was a child. She states that depending on the type of food she eats, she may have diarrhear every 2-3 weeks - salads, beef, greasy and spicy foods may cause diarrhea. She can have 2-3 watery bowel movements with watery consistency. This gets better with Imodium. May take Imodium if she goes out of her home to prevent bouts of diarrhea. The patient denies having any nausea, vomiting, fever, chills, hematochezia, melena, hematemesis, abdominal distention, abdominal pain, jaundice, pruritus or weight loss.  Notably she had negative alpha gal testing on 05/07/21.  Patient notes from other provider, she had negative Endo mesial antibodies in the past but I do not have these available.  Patient states that she takes occasionally Tums for heartburn, possibly twice a week. States food like french fries, bananas, and soups may trigger her symptoms.  Last EGD: states she had it in the past but no preports available Last Colonoscopy: 2019 The perianal and digital rectal examinations were normal. A few diverticula were found in the sigmoid colon. The terminal ileum appeared normal. The  exam was otherwise without abnormality on direct and retroflexion views. Fecal Microbiota Transplant (Bacteriotherapy): Donor stool was prepared by a third party (purchased) as per protocol. Approximately 450 mL of the emulsified donor stool was instilled in the terminal ileum. A detailed colonoscopic detailed colonoscopic exam could not be performed upon scope withdrawal secondary to limited visibility from the instilled stool.  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)    Hypertension    IBS (irritable bowel syndrome)    Lactose intolerance    Palpitation    Palpitations    Vitamin D deficiency disease 06/23/2019    Past Surgical History: 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   Procedure: FECAL TRANSPLANT;  Surgeon: Gatha Mayer, MD;  Location: [facility];  Service: Endoscopy;;   KNEE ARTHROSCOPY Right    Precancerous  Tissue - Nose  February 14-2014    Family History: Family History  Problem Relation Age of Onset   Lymphoma Mother    Colon cancer Mother    Hypertension Mother    Migraines Mother    Other Father  Wagner's Granulermatosis   Seizures Sister    Cancer Maternal Grandmother        brain   Cancer Maternal Grandfather        brain   Other Paternal Grandmother        blood clot - DVT following surgery   Alcohol abuse Paternal Grandfather    Cirrhosis Paternal Grandfather    Thyroid disease Sister    Heart disease Sister      Social History: Social History   Tobacco Use  Smoking Status Never  Smokeless Tobacco Never   Social History   Substance and Sexual Activity  Alcohol Use No   Alcohol/week: 0.0 standard drinks of alcohol   Social History   Substance and Sexual Activity  Drug Use No    Allergies: Allergies  Allergen Reactions   Hydrocodone Nausea And Vomiting   Prednisone Other (See Comments)    Heart races    Medications: Current Outpatient Medications  Medication Sig Dispense Refill   Calcium Carb-Cholecalciferol (CALCIUM + VITAMIN D3 PO) Take 1 tablet by mouth 2 (two) times daily.     Cholecalciferol (VITAMIN D-3) 125 MCG (5000 UT) TABS Take 1 tablet by mouth daily.     fluticasone (FLONASE) 50 MCG/ACT nasal spray PLACE 2 SPRAYS IN BOTH NOSTRILS DAILY. 16 g 3   meclizine (ANTIVERT) 12.5 MG tablet Take 1 tablet (12.5 mg total) by mouth 3 (three) times daily as needed for dizziness. 30 tablet 2   propranolol (INDERAL) 60 MG tablet Take 1 tablet (60 mg total) by mouth 2 (two) times daily as needed. 60 tablet 6   vitamin B-12 (CYANOCOBALAMIN) 1000 MCG tablet Take 1,000 mcg by mouth daily.     No current facility-administered medications for this visit.    Review of Systems: GENERAL: negative for malaise, night sweats HEENT: No changes in hearing or vision, no nose bleeds or other nasal problems. NECK: Negative for lumps, goiter, pain and significant neck swelling RESPIRATORY: Negative for cough, wheezing CARDIOVASCULAR: Negative for chest pain, leg swelling, palpitations, orthopnea GI: SEE HPI MUSCULOSKELETAL: Negative for joint pain or swelling, back pain, and muscle pain. SKIN: Negative for lesions, rash PSYCH: Negative for sleep disturbance, mood disorder and recent psychosocial stressors. HEMATOLOGY Negative for prolonged bleeding, bruising easily, and swollen nodes. ENDOCRINE: Negative for cold or heat intolerance, polyuria, polydipsia and goiter. NEURO: negative for  tremor, gait imbalance, syncope and seizures. The remainder of the review of systems is noncontributory.   Physical Exam: BP 123/81 (BP Location: Left Arm, Patient Position: Sitting, Cuff Size: Small)   Pulse 72   Temp 97.8 F (36.6 C) (Temporal)   Ht '5\' 4"'$  (1.626 m)   Wt 138 lb 9.6 oz (62.9 kg)   BMI 23.79 kg/m  GENERAL: The patient is AO x3, in no acute distress. HEENT: Head is normocephalic and atraumatic. EOMI are intact. Mouth is well hydrated and without lesions. NECK: Supple. No masses LUNGS: Clear to auscultation. No presence of rhonchi/wheezing/rales. Adequate chest expansion HEART: RRR, normal s1 and s2. ABDOMEN: Soft, nontender, no guarding, no peritoneal signs, and nondistended. BS +. No masses. EXTREMITIES: Without any cyanosis, clubbing, rash, lesions or edema. NEUROLOGIC: AOx3, no focal motor deficit. SKIN: no jaundice, no rashes  Imaging/Labs: as above  I personally reviewed and interpreted the available labs, imaging and endoscopic files.  Impression and Plan: ZALAYAH PIZZUTO is a 81 y.o. female with PMH C. Diff colitis s/p stool transplant, IBS-D, anxiety, hypertension, diabetes, who presents for follow up of due  to history of colitis and GERD.  The patient has presented a chronic history of diarrhea for multiple years which is intermittent.  This is different from her previous episodes of C. difficile which required treatment with fecal transplant.  I consider that this has been well-controlled after FMT as she did not have any recurrence of her stool was negative for C. difficile and her last testing.  However, given the chronicity of her symptoms since early age, I consider her symptoms are likely related to IBS-D, especially as they are related to specific types of food.  Due to this, I advised her to avoid eating the specific food triggers, but she can try using plant enzymes if interested in trying salads.  She can continue taking Imodium as needed for  now.  Lastly, she has presented some intermittent episodes of heartburn which could be related to acid reflux.  She would like to avoid taking medications that may increase her risk of recurrent C. difficile.  We discussed the possibility of trying an anti-H2 medication instead of a PPI as her symptoms are not present on a daily basis and the risk may be lower with this type of group of medications is possibly lower.  - Continue avoiding foods that trigger diarrhea episodes - If willing to try salads can try "Plant Enzymes" when eating this food - Continue Imodium as needed for diarrhea - Start Pepcid 20 mg at bedtime  All questions were answered.      [name], MD Gastroenterology and Hepatology [facility]

## 2022-08-14 NOTE — Patient Instructions (Signed)
Continue avoiding foods that trigger diarrhea episodes If willing to try salads can try "Plant Enzymes" when eating this food Continue Imodium as needed for diarrhea Start Pepcid 20 mg at bedtime

## 2022-11-04 DIAGNOSIS — Z1329 Encounter for screening for other suspected endocrine disorder: Secondary | ICD-10-CM | POA: Diagnosis not present

## 2022-11-04 DIAGNOSIS — Z13220 Encounter for screening for lipoid disorders: Secondary | ICD-10-CM | POA: Diagnosis not present

## 2022-11-04 DIAGNOSIS — E559 Vitamin D deficiency, unspecified: Secondary | ICD-10-CM | POA: Diagnosis not present

## 2022-11-04 DIAGNOSIS — Z0001 Encounter for general adult medical examination with abnormal findings: Secondary | ICD-10-CM | POA: Diagnosis not present

## 2022-11-11 DIAGNOSIS — Z6822 Body mass index (BMI) 22.0-22.9, adult: Secondary | ICD-10-CM | POA: Diagnosis not present

## 2022-11-11 DIAGNOSIS — M810 Age-related osteoporosis without current pathological fracture: Secondary | ICD-10-CM | POA: Diagnosis not present

## 2022-11-11 DIAGNOSIS — R42 Dizziness and giddiness: Secondary | ICD-10-CM | POA: Diagnosis not present

## 2022-11-11 DIAGNOSIS — R030 Elevated blood-pressure reading, without diagnosis of hypertension: Secondary | ICD-10-CM | POA: Diagnosis not present

## 2022-11-11 DIAGNOSIS — E559 Vitamin D deficiency, unspecified: Secondary | ICD-10-CM | POA: Diagnosis not present

## 2022-11-11 DIAGNOSIS — J302 Other seasonal allergic rhinitis: Secondary | ICD-10-CM | POA: Diagnosis not present

## 2022-11-11 DIAGNOSIS — Z0001 Encounter for general adult medical examination with abnormal findings: Secondary | ICD-10-CM | POA: Diagnosis not present

## 2022-11-11 DIAGNOSIS — R002 Palpitations: Secondary | ICD-10-CM | POA: Diagnosis not present

## 2022-11-19 NOTE — Progress Notes (Addendum)
Patient question for me  GI Office Note    Referring Provider: [place] Nation, MD Primary Care Physician:  [place] Nation, MD Primary Gastroenterologist: Harvel Quale, MD  Date:  11/20/2022  ID:  Rhonda Andrews, Rhonda Andrews 07/20/1941, MRN [id number]   Chief Complaint   Chief Complaint  Patient presents with   Follow-up    Doing well, needing to know what kind of medications that she can take for osteoarthritis that is not an nsaid.   History of Present Illness  Rhonda Andrews is a 82 y.o. female with a history of C. difficile s/p stool transplant, GERD, IBS-D, anxiety, HTN, diabetes presenting today for follow-up.    Colonoscopy 05/21/2018 by Dr. Carlean Purl: -normal perianal and DRE -TI normal -FMT performed starting in TI -No repeat colonoscopy due to age.  -Advised imodium when she returned home.  -Cautious antibiotic use.   Negative alpha gal panel 05/07/21.   Last office visit 08/14/2022. Patient reported history of chronic diarrhea for multiple years since she was a child.  Frequency of diarrhea depending on her diet.  Salads, beef, greasy and fatty foods cause diarrhea.  Usually watery consistency.  Gets better with Imodium.  Takes Imodium prophylactically when going out.  Denied any nausea, vomiting, fever, chills, medic easy, melena, hematemesis, distention, abdominal pain, jaundice, pruritus, weight loss.  Takes Tums occasionally for heartburn twice per week likely due to dietary triggers.  She reported a EGD in the past but no reports available.  Advised to avoid food triggers that cause diarrhea.  Advised that she could try plant enzymes when eating salad.  Continue Imodium as needed for diarrhea.  Start Pepcid 20 mg daily at bedtime instead of PPI given history of C. difficile.   Bone density 11/14/21: osteoporosis.   Today:  Fast food causes her to have some diarrhea. She is trying to eat healthier but things like broccoli causes her more gas than anything  else. Has been working on eating better since she was diagnosed with osteoporosis.  Feeling well overall. Can function if she does not have frequent diarrhea. When she feels very gassy she feels as though she will have a BM. Reports it is nothing like when she had Cdiff. Still has many things she is not able to eat but much better than when she had Cdiff. No abdominal pain. Only able to tlerate a few fruits. If she eats a salad she tries to limit the amount of lettuce and tries to use more vinegar base dressings.   Has appointment with ENT for her facial pain. Had a root canal done and has pain from her nose to her ear.   Reports she does not have as bad of diarrhea if she does not take calcium. Does not want fosamax or other biphosphatase.   Has not taken pepcid at all. Does have some burning and belching.   Uses imodium preventatively when going out.    Current Outpatient Medications  Medication Sig Dispense Refill   Calcium Carb-Cholecalciferol (CALCIUM + VITAMIN D3 PO) Take 1 tablet by mouth 2 (two) times daily.     Cholecalciferol (VITAMIN D-3) 125 MCG (5000 UT) TABS Take 1 tablet by mouth daily.     famotidine (PEPCID) 20 MG tablet Take 1 tablet (20 mg total) by mouth at bedtime. 90 tablet 3   fluticasone (FLONASE) 50 MCG/ACT nasal spray PLACE 2 SPRAYS IN BOTH NOSTRILS DAILY. 16 g 3   meclizine (ANTIVERT) 12.5 MG tablet Take 1 tablet (12.5 mg  total) by mouth 3 (three) times daily as needed for dizziness. 30 tablet 2   propranolol (INDERAL) 60 MG tablet Take 1 tablet (60 mg total) by mouth 2 (two) times daily as needed. 60 tablet 6   vitamin B-12 (CYANOCOBALAMIN) 1000 MCG tablet Take 1,000 mcg by mouth daily.     No current facility-administered medications for this visit.    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)    Hypertension    IBS (irritable bowel syndrome)    Lactose intolerance    Palpitation    Palpitations    Vitamin D deficiency disease 06/23/2019    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   Procedure: FECAL TRANSPLANT;  Surgeon: Gatha Mayer, MD;  Location: [facility];  Service: Endoscopy;;   KNEE ARTHROSCOPY Right    Precancerous  Tissue - Nose  February 14-2014    Family History  Problem Relation Age of Onset   Lymphoma Mother    Colon cancer Mother    Hypertension Mother    Migraines Mother    Other Father        Wagner's Granulermatosis   Seizures Sister    Cancer Maternal Grandmother        brain   Cancer Maternal Grandfather        brain   Other Paternal Grandmother        blood clot - DVT following surgery   Alcohol abuse Paternal Grandfather    Cirrhosis Paternal Grandfather    Thyroid disease Sister    Heart disease Sister     Allergies as of 11/20/2022 - Review Complete 11/20/2022  Allergen Reaction Noted   Hydrocodone Nausea And Vomiting 05/19/2018   Prednisone Other (See Comments) 10/28/2014    Social History   Socioeconomic History   Marital status: Married    Spouse name: Not on file   Number of children: 1   Years of education: Not on file   Highest education level: Some college, no degree  Occupational History   Not on file  Tobacco Use   Smoking status: Never   Smokeless tobacco: Never  Vaping Use   Vaping Use: Never used  Substance and Sexual Activity   Alcohol use: No    Alcohol/week: 0.0 standard drinks of alcohol   Drug use: No   Sexual activity: Not Currently  Other Topics Concern    Not on file  Social History Narrative   Married. Has a daughter. 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   Eats all food groups and has a great appetite.    Walks in house daily in her house, 1.5 miles a day.   Right handed   Quit drinking caffeine 15 years ago   No EtOH/tobacco   Social Determinants of Health   Financial Resource Strain: Not on file  Food Insecurity: Not on file  Transportation Needs: Not on file  Physical Activity: Inactive (07/17/2017)   Exercise Vital Sign    Days of Exercise per Week: 0 days    Minutes of Exercise per Session: 0 min  Stress: No Stress Concern Present (07/17/2017)   Aspers    Feeling of Stress : Only a little  Social Connections: Socially Integrated (07/17/2017)   Social Connection and Isolation Panel [NHANES]    Frequency of Communication with Friends and Family: More than three times a week    Frequency of Social Gatherings with Friends and Family: More than three times a week    Attends Religious Services: More than 4 times per year    Active Member of [employer] or Organizations: Yes    Attends Music therapist: More than 4 times per year    Marital Status: Married     Review of Systems   Gen: Denies fever, chills, anorexia. Denies fatigue, weakness, weight loss.  CV: Denies chest pain, palpitations, syncope, peripheral edema, and claudication. Resp: Denies dyspnea at rest, cough, wheezing, coughing up blood, and pleurisy. GI: See HPI Derm: Denies rash, itching, dry skin Psych: Denies depression, anxiety, memory loss, confusion. No homicidal or suicidal ideation.  Heme: Denies bruising, bleeding, and enlarged lymph nodes.   Physical Exam   BP 127/80 (BP Location: Right Arm, Patient Position: Sitting, Cuff Size: Normal)   Pulse 73   Temp (!) 97.4 F (36.3 C) (Oral)   Ht 5\' 4"  (1.626 m)    Wt 138 lb 12.8 oz (63 kg)   SpO2 91%   BMI 23.82 kg/m   General:   Alert and oriented. No distress noted. Pleasant and cooperative.  Head:  Normocephalic and atraumatic. Eyes:  Conjuctiva clear without scleral icterus. Mouth:  Oral mucosa pink and moist. Good dentition. No lesions. Lungs:  Clear to auscultation bilaterally. No wheezes, rales, or rhonchi. No distress.  Heart:  S1, S2 present without murmurs appreciated.  Abdomen: Hyperactive bowel sounds, soft, non-tender and non-distended. No rebound or guarding. No HSM or masses noted. Rectal: deferred Msk:  Symmetrical without gross deformities. Normal posture. Extremities:  Without edema. Neurologic:  Alert and  oriented x4 Psych:  Alert and cooperative. Normal mood and affect.   Assessment  Rhonda Andrews is a 82 y.o. female with a history of C. difficile s/p stool transplant, GERD, IBS-D, anxiety, HTN, diabetes, and colitis presenting today for follow up with complaint of abdominal pain.   GERD: Intermittent symptoms.  Usually uses Tums as needed.  Has not tried Pepcid since her last office visit.  Encouraged her to use this as needed in place of Tums given her reports that extra calcium tends to make her diarrhea worse.  IBS-D, History of recurrent C. Difficile: Underwent FMT in 2019.  Continues to have intermittent diarrhea likely IBS-D.  Usually triggered by certain dietary factors.  Advised her to continue to avoid her trigger foods as well as to reduce her intake of raw vegetables as this contributed to the increased gas.  Also advised her that with diarrhea a lot of times a higher fiber diet can contribute to worsening diarrhea.  She typically is having more good days and bad days in regards to her diarrhea.  She will work on cooking more of her vegetables rather than eating raw.  Also advised her to use plant enzymes when eating raw vegetables or salads to aid in better digestion.  Advised her to continue to use Imodium as  needed for  diarrhea including preventatively when going out.  PLAN   Work toward getting plant enzymes with eating vegetables and salad.  Gave her specific options to look for. Use Pepcid 20 mg as needed at bedtime.  Continue to avoid trigger foods Use imodium as needed for diarrhea Follow up in 6 months.     Venetia Night, MSN, FNP-BC, AGACNP-BC [facility] . I have reviewed the note and agree with the APP's assessment as described in this progress note  [name], MD Gastroenterology and Hepatology [facility]

## 2022-11-20 ENCOUNTER — Encounter: Payer: Self-pay | Admitting: Gastroenterology

## 2022-11-20 ENCOUNTER — Ambulatory Visit: Payer: Medicare Other | Admitting: Gastroenterology

## 2022-11-20 VITALS — BP 127/80 | HR 73 | Temp 97.4°F | Ht 64.0 in | Wt 138.8 lb

## 2022-11-20 DIAGNOSIS — Z8619 Personal history of other infectious and parasitic diseases: Secondary | ICD-10-CM | POA: Diagnosis not present

## 2022-11-20 DIAGNOSIS — K219 Gastro-esophageal reflux disease without esophagitis: Secondary | ICD-10-CM

## 2022-11-20 DIAGNOSIS — K580 Irritable bowel syndrome with diarrhea: Secondary | ICD-10-CM

## 2022-11-20 NOTE — Patient Instructions (Addendum)
Try taking "Plant Enzymes" when eating fibrous fruits and vegetables or salads.  Raw/uncooked vegetables are typically more fibers in order to breakdown which can cause worsening diarrhea.  You should be able to get plant enzymes over-the-counter from any drugstore such as [facility], [facility], [facility].  There is also a brand called NOW that has plan enzymes that she can get at [place] or on [employer].  It is a bit more expensive but there is also a company called [employer] that makes a plant enzyme that you can order from online.  Continue using Imodium as needed preventatively when going out or as needed for diarrhea.  Continue to avoid your typical triggers for diarrhea.  For any intermittent reflux symptoms that she have you may use Pepcid as needed at bedtime in place of using Tums.  Try to avoid NSAIDs as well.  For any pain associated with arthritis you may use Tylenol or over-the-counter diclofenac cream sparingly to joints.  It was a pleasure to see you today. I want to create trusting relationships with patients. If you receive a survey regarding your visit,  I greatly appreciate you taking time to fill this out on paper or through your MyChart. I value your feedback.  Venetia Night, MSN, FNP-BC, AGACNP-BC [facility]

## 2022-12-03 ENCOUNTER — Telehealth (INDEPENDENT_AMBULATORY_CARE_PROVIDER_SITE_OTHER): Payer: Self-pay

## 2022-12-03 ENCOUNTER — Other Ambulatory Visit: Payer: Self-pay | Admitting: Gastroenterology

## 2022-12-03 DIAGNOSIS — R519 Headache, unspecified: Secondary | ICD-10-CM | POA: Diagnosis not present

## 2022-12-03 DIAGNOSIS — A0471 Enterocolitis due to Clostridium difficile, recurrent: Secondary | ICD-10-CM

## 2022-12-03 DIAGNOSIS — R197 Diarrhea, unspecified: Secondary | ICD-10-CM

## 2022-12-03 NOTE — Telephone Encounter (Signed)
Patient made aware she may pick up stool containers from [facility] as Loma Sousa has ordered.

## 2022-12-03 NOTE — Telephone Encounter (Signed)
Patient called today states she has been having 4-5 loose stools per day. Stool has a bad smell, and she has had C diff in the past. Would like a C Diff test done again at [facility]. Patient denies andy fever, abdominal pain or blood in stools. Last seen by Loma Sousa on 10/31/2022. Please advise.

## 2022-12-04 DIAGNOSIS — R197 Diarrhea, unspecified: Secondary | ICD-10-CM | POA: Diagnosis not present

## 2022-12-08 LAB — C. DIFFICILE GDH AND TOXIN A/B
GDH ANTIGEN: DETECTED
MICRO NUMBER:: 14785712
SPECIMEN QUALITY:: ADEQUATE
TOXIN A AND B: NOT DETECTED

## 2022-12-08 LAB — CLOSTRIDIUM DIFFICILE TOXIN B, QUALITATIVE, REAL-TIME PCR: Toxigenic C. Difficile by PCR: NOT DETECTED

## 2022-12-15 NOTE — Progress Notes (Unsigned)
GI Office Note    Referring Provider: Donetta Potts, MD Primary Care Physician:  Donetta Potts, MD Primary Gastroenterologist: Dolores Frame, MD  Date:  12/16/2022  ID:  Rhonda Andrews, Rhonda Andrews 10-06-1940, MRN [id number]   Chief Complaint   Chief Complaint  Patient presents with   Diarrhea    Patient here today due to issues with on going diarrhea. Patient says she has up to ten bowel movements per day.She says she takes imodium  1/2- 2 tablets prn. Patient has been using Essential Enzymes 500 mg 1/2 daily prn,but these cause her stomach to cramp.   History of Present Illness  Rhonda Andrews is a 82 y.o. female with a history of fecal transplant, GERD, IBS-D, anxiety, HTN, diabetes and colitis presenting today with ongoing diarrhea.  Colonoscopy 05/21/2018 by Dr. Leone Payor: -normal perianal and DRE -TI normal -FMT performed starting in TI -No repeat colonoscopy due to age.  -Advised imodium when she returned home.  -Cautious antibiotic use.    Negative alpha gal panel 05/07/21.    Office visit 08/14/2022. Patient reported history of chronic diarrhea for multiple years since she was a child.  Frequency of diarrhea depending on her diet.  Salads, beef, greasy and fatty foods cause diarrhea.  Usually watery consistency.  Gets better with Imodium.  Takes Imodium prophylactically when going out.  Denied any nausea, vomiting, fever, chills, medic easy, melena, hematemesis, distention, abdominal pain, jaundice, pruritus, weight loss.  Takes Tums occasionally for heartburn twice per week likely due to dietary triggers.  She reported a EGD in the past but no reports available.  Advised to avoid food triggers that cause diarrhea.  Advised that she could try plant enzymes when eating salad.  Continue Imodium as needed for diarrhea.  Start Pepcid 20 mg daily at bedtime instead of PPI given history of C. difficile.    Bone density 11/14/21: osteoporosis.   Last office visit  11/20/22.  Reports fast food causing her to have diarrhea when she eats healthier foods such as broccoli this gives her gas.  Overall feeling well as long she does not have frequent diarrhea.  Diarrhea not as bad as when she had C. difficile.  Denies any abdominal pain.  Tries to limit the amount of roughage that she eats given lettuce causes her to have worsening diarrhea.  Also notes improvement in diarrhea if she does not take calcium belching but denies use of.  Using Imodium empirically prior to going out. Advised to trial plant enzymes to take when eating vegetables or salads.  Encouraged Pepcid 20 mg nightly.  Advised ongoing use of Imodium as needed for diarrhea.  Advised to follow-up in 6 months.  Patient called in 12/03/2022 reporting she was having 4-5 loose stools per day with a foul odor and was concerned about having C. difficile again.  She denies any fever, abdominal pain, or blood in stools.  C. difficile and GI pathogen panel negative 12/04/22.  Patient reported no improvement with cholestyramine in the past to help with diarrhea.  Patient concerned about bacterial overgrowth.  Reported prior history of this.  Today: Continuing to have issues with diarrhea. Average is about 3 BM per day but at times has had up to 10 per day but this has only happened about 3 times in the last 6 months. This past weekend she had hamburger steak and baked potato and reports that was a disaster. The last couple days she has eaten rice and some boiled eggs  and has not had as much diarrhea.  Most of the time she takes 1 imodium per day, this past Sunday she took 2. Sometimes she may take 1/2 tablet of imodium but may go months without having to take any. Has a lot of cramping with stools. Salad and roughage is a trigger for her.   Has had issues with probiotics because they contain milk. She is working on finding a lactose free brand.   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   NO melena or BRBPR.   GERD - not a lot of burning symptoms. Unable to tolerate a lot of the trigger foods. Only eats hamburger about 3 times per year.    Current Outpatient Medications  Medication Sig Dispense Refill   Calcium Carb-Cholecalciferol (CALCIUM + VITAMIN D3 PO) Take 1 tablet by mouth 2 (two) times daily.     Cholecalciferol (VITAMIN D-3) 125 MCG (5000 UT) TABS Take 1 tablet by mouth daily.     famotidine (PEPCID) 20 MG tablet Take 1 tablet (20 mg total) by mouth at bedtime. 90 tablet 3   fluticasone (FLONASE) 50 MCG/ACT nasal spray PLACE 2 SPRAYS IN BOTH NOSTRILS DAILY. 16 g 3   meclizine (ANTIVERT) 12.5 MG tablet Take 1 tablet (12.5 mg total) by mouth 3 (three) times daily as needed for dizziness. 30 tablet 2   OVER THE COUNTER MEDICATION Essential Enzymes 500 mg 1/2 daily prn     propranolol (INDERAL) 60 MG tablet Take 1 tablet (60 mg total) by mouth 2 (two) times daily as needed. 60 tablet 6   rifaximin (XIFAXAN) 550 MG TABS tablet Take 1 tablet (550 mg total) by mouth 3 (three) times daily. 42 tablet 0   No current facility-administered medications for this visit.    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)    Hypertension    IBS (irritable bowel syndrome)    Lactose intolerance    Palpitation    Palpitations    Vitamin D deficiency disease 06/23/2019    Past Surgical History:  Procedure Laterality Date   ABDOMINAL HYSTERECTOMY     ovaries remained   CHOLECYSTECTOMY     COLONOSCOPY  2010   COLONOSCOPY WITH PROPOFOL N/A 05/21/2018   Procedure: COLONOSCOPY WITH PROPOFOL;  Surgeon: Iva Boop, MD;  Location: [facility];  Service: Endoscopy;  Laterality: N/A;  with  FMT   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   Procedure: FECAL TRANSPLANT;  Surgeon: Iva Boop, MD;  Location: [facility];  Service: Endoscopy;;   KNEE ARTHROSCOPY Right    Precancerous  Tissue - Nose  February 14-2014    Family History  Problem Relation Age of Onset   Lymphoma Mother    Colon cancer Mother    Hypertension Mother    Migraines Mother    Other Father        Wagner's Granulermatosis   Seizures Sister    Cancer Maternal Grandmother        brain   Cancer Maternal Grandfather        brain   Other Paternal Grandmother  blood clot - DVT following surgery   Alcohol abuse Paternal Grandfather    Cirrhosis Paternal Grandfather    Thyroid disease Sister    Heart disease Sister     Allergies as of 12/16/2022 - Review Complete 12/16/2022  Allergen Reaction Noted   Hydrocodone Nausea And Vomiting 05/19/2018   Prednisone Other (See Comments) 10/28/2014    Social History   Socioeconomic History   Marital status: Married    Spouse name: Not on file   Number of children: 1   Years of education: Not on file   Highest education level: Some college, no degree  Occupational History   Not on file  Tobacco Use   Smoking status: Never   Smokeless tobacco: Never  Vaping Use   Vaping Use: Never used  Substance and Sexual Activity   Alcohol use: No    Alcohol/week: 0.0 standard drinks of alcohol   Drug use: No   Sexual activity: Not Currently  Other Topics Concern   Not on file  Social History Narrative   Married. Has a daughter. 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   Eats all food groups and has a great appetite.    Walks in house daily in her house, 1.5 miles a day.   Right handed   Quit drinking caffeine 15 years ago   No EtOH/tobacco   Social Determinants of Health   Financial Resource Strain: Not on file  Food Insecurity: Not on file  Transportation Needs: Not on file  Physical  Activity: Inactive (07/17/2017)   Exercise Vital Sign    Days of Exercise per Week: 0 days    Minutes of Exercise per Session: 0 min  Stress: No Stress Concern Present (07/17/2017)   [employer] of Occupational Health - Occupational Stress Questionnaire    Feeling of Stress : Only a little  Social Connections: Socially Integrated (07/17/2017)   Social Connection and Isolation Panel [NHANES]    Frequency of Communication with Friends and Family: More than three times a week    Frequency of Social Gatherings with Friends and Family: More than three times a week    Attends Religious Services: More than 4 times per year    Active Member of [employer] or Organizations: Yes    Attends Engineer, structural: More than 4 times per year    Marital Status: Married     Review of Systems   Gen: Denies fever, chills, anorexia. Denies fatigue, weakness, weight loss.  CV: Denies chest pain, palpitations, syncope, peripheral edema, and claudication. Resp: Denies dyspnea at rest, cough, wheezing, coughing up blood, and pleurisy. GI: See HPI Derm: Denies rash, itching, dry skin Psych: Denies depression, anxiety, memory loss, confusion. No homicidal or suicidal ideation.  Heme: Denies bruising, bleeding, and enlarged lymph nodes.   Physical Exam   BP 137/82 (BP Location: Left Arm, Patient Position: Sitting, Cuff Size: Large)   Pulse 76   Temp (!) 97.3 F (36.3 C) (Temporal)   Ht  (1.626 m)   Wt 136 lb 6.4 oz (61.9 kg)   BMI 23.41 kg/m   General:   Alert and oriented. No distress noted. Pleasant and cooperative.  Head:  Normocephalic and atraumatic. Eyes:  Conjuctiva clear without scleral icterus. Mouth:  Oral mucosa pink and moist. Good dentition. No lesions. Lungs:  Clear to auscultation bilaterally. No wheezes, rales, or rhonchi. No distress.  Heart:  S1, S2 present without murmurs appreciated.  Abdomen:  +BS, soft, non-tender and non-distended. No rebound or guarding. No  HSM or masses noted. Rectal: deferred Msk:  Symmetrical without gross deformities. Normal posture. Extremities:  Without edema. Neurologic:  Alert and  oriented x4 Psych:  Alert and cooperative. Normal mood and affect.   Assessment  Rhonda Andrews is a 82 y.o. female with a history of fecal transplant, GERD, IBS-D, anxiety, HTN, diabetes and colitis presenting today with ongoing diarrhea and concern for SIBO.   History of recurrent C. Difficile, IBS-D: History of FMT in 2019.  Continues to have intermittent diarrhea.  On average she has about 3 bowel movements per day while taking about a half a tablet twice daily of Imodium.  At times she may have as many as 10 bowel movements per day but usually triggered by certain dietary choices including roughage and higher fat content foods.  Since her last visit she had reported increase in stool frequency and foul odor therefore she was sent for C. difficile and GI pathogen panel that was negative.  She notes a history of SIBO in the past and treated with Xifaxan.  Previous reports of cholestyramine not being helpful for diarrhea.  We will treat with Xifaxan 550 mg 3 times daily for 2 weeks for possible SIBO given some abdominal cramping as well as her diarrhea and encouraged her to continue to take Imodium as needed.  Also encouraged to continue to follow a low FODMAP diet.  GERD: Infrequent symptoms. Continue famotidine 20 mg nightly as needed.  PLAN   Xifaxin 550 mg TID for 2 weeks. (May need PA or patient assistance) Continue imodium as needed. May take before meals scheduled if needed.  Avoid trigger foods.  Low FODMAP diet Famotidine 20 mg at bedtime as needed.  Follow up in 3 months   Brooke Bonito, MSN, FNP-BC, AGACNP-BC [facility]  I have reviewed the note and agree with the APP's assessment as described in this progress note  Katrinka Blazing, MD Gastroenterology and Hepatology [facility]  Gastroenterology

## 2022-12-16 ENCOUNTER — Ambulatory Visit (INDEPENDENT_AMBULATORY_CARE_PROVIDER_SITE_OTHER): Payer: Medicare Other | Admitting: Gastroenterology

## 2022-12-16 ENCOUNTER — Encounter: Payer: Self-pay | Admitting: Gastroenterology

## 2022-12-16 VITALS — BP 137/82 | HR 76 | Temp 97.3°F | Ht 64.0 in | Wt 136.4 lb

## 2022-12-16 DIAGNOSIS — K219 Gastro-esophageal reflux disease without esophagitis: Secondary | ICD-10-CM | POA: Diagnosis not present

## 2022-12-16 DIAGNOSIS — K6389 Other specified diseases of intestine: Secondary | ICD-10-CM

## 2022-12-16 DIAGNOSIS — R197 Diarrhea, unspecified: Secondary | ICD-10-CM | POA: Diagnosis not present

## 2022-12-16 DIAGNOSIS — K580 Irritable bowel syndrome with diarrhea: Secondary | ICD-10-CM

## 2022-12-16 DIAGNOSIS — A0471 Enterocolitis due to Clostridium difficile, recurrent: Secondary | ICD-10-CM | POA: Diagnosis not present

## 2022-12-16 MED ORDER — RIFAXIMIN 550 MG PO TABS: 550.0000 mg | ORAL_TABLET | Freq: Three times a day (TID) | ORAL | 0 refills | Status: DC

## 2022-12-16 NOTE — Patient Instructions (Addendum)
Continue Imodium as needed.  Instead of taking your normal half tablet in the morning and before bed you may try taking this half before meals to see if this improves your bowel frequency.  Continue to avoid your typical trigger foods including roughage.  Continue to work toward finding a plant-based probiotic that is lactose-free.  We will trial Xifaxan 550 mg 3 times daily for 2 weeks to treat for possible bacterial overgrowth given your history of this.  Please let me know if you have any issues obtaining this medication.  I would revert back to your low FODMAP diet to help with your current symptoms.  It was a pleasure to see you today. I want to create trusting relationships with patients. If you receive a survey regarding your visit,  I greatly appreciate you taking time to fill this out on paper or through your MyChart. I value your feedback.  Brooke Bonito, MSN, FNP-BC, AGACNP-BC [facility]

## 2022-12-17 ENCOUNTER — Telehealth (INDEPENDENT_AMBULATORY_CARE_PROVIDER_SITE_OTHER): Payer: Self-pay | Admitting: *Deleted

## 2022-12-17 NOTE — Telephone Encounter (Signed)
Patient seen yesterday and prescribed xifaxan. Med needed PA which I completed through cover my meds and it was approved however after insurance paid their portion ($1,000) the co pay is $758.75. I called patient and discussed with her that we could do patient assistance and if she is approved she could get med from manufacturer for free. I have filled out our portion of the form and let patient know she needs to pick up form and do her portion.

## 2022-12-17 NOTE — Telephone Encounter (Signed)
Patient did her portion of the form and just need your signature on page 5 and 6. Will leave at my desk til you are back in office at main.

## 2022-12-17 NOTE — Telephone Encounter (Signed)
Courtney,  Pt told me she would come today or tomorrow to do her portion of the form and I will give to you next week to sign so we can send in for her.

## 2022-12-23 ENCOUNTER — Telehealth: Payer: Self-pay | Admitting: Cardiology

## 2022-12-23 NOTE — Telephone Encounter (Signed)
Forms signed and faxed. Await decision

## 2022-12-23 NOTE — Telephone Encounter (Signed)
Patient states that on Saturday, she had some BBQ and on Sunday when she woke up, her BP was 160/88 and heart rate 70. States that she took 1/8 of propanolol 60 mg and took 1/4 of a pill an hour later. After taking the medication, her heart rate dropped to the 50's and she got really weak. States that she only takes this medication as needed because it causes her heart rate to drop. Just took her blood pressure and it was 129/78 HR 69.States that she has been under a lot of stress with her husband and his health as well as the death of a family friend. Denies chest pains or SOB. Appointment was scheduled with Sharlene Dory for Friday, 4/26 @ 4:00 pm. Please advise

## 2022-12-23 NOTE — Telephone Encounter (Signed)
Pt c/o BP issue: STAT if pt c/o blurred vision, one-sided weakness or slurred speech  1. What are your last 5 BP readings?  4/23: 175/105 130/80 57  2. Are you having any other symptoms (ex. Dizziness, headache, blurred vision, passed out)?   3. What is your BP issue?  Patient states her BP has been elevated and her HR is a little lower

## 2022-12-25 NOTE — Telephone Encounter (Signed)
Patient made aware. Instructed to bring blood pressure machine to check for accuracy.

## 2022-12-26 ENCOUNTER — Encounter: Payer: Self-pay | Admitting: Nurse Practitioner

## 2022-12-26 ENCOUNTER — Ambulatory Visit: Payer: Medicare Other | Attending: Nurse Practitioner | Admitting: Nurse Practitioner

## 2022-12-26 VITALS — BP 128/68 | HR 68 | Ht 64.0 in | Wt 137.4 lb

## 2022-12-26 DIAGNOSIS — R030 Elevated blood-pressure reading, without diagnosis of hypertension: Secondary | ICD-10-CM

## 2022-12-26 DIAGNOSIS — R002 Palpitations: Secondary | ICD-10-CM | POA: Diagnosis not present

## 2022-12-26 MED ORDER — PROPRANOLOL HCL ER 60 MG PO CP24: 60.0000 mg | ORAL_CAPSULE | Freq: Every day | ORAL | 1 refills | Status: DC

## 2022-12-26 NOTE — Progress Notes (Signed)
Office Visit    Patient Name: Rhonda Andrews Date of Encounter: 12/26/2022  PCP:  Donetta Potts, MD   [facility]  Cardiologist:  Dina Rich, MD *** Advanced Practice Provider:  No care team member to display Electrophysiologist:  None  {Press F2 to show EP APP, CHF, sleep or structural heart MD               :[id number]}  { Click here to update then REFRESH NOTE - MD (PCP) or APP (Team Member)  Change PCP Type for MD, Specialty for APP is either Cardiology or Clinical Cardiac Electrophysiology  :[id number]}  Chief Complaint    Rhonda Andrews is a 82 y.o. female with a hx of palpitations, dizziness, hx of recurrent C. Diff, IBS, and anxiety, who presents today for "elevated BP and low heart rate" evaluation.   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)    Hypertension    IBS (irritable bowel syndrome)    Lactose intolerance    Palpitation    Palpitations    Vitamin D deficiency disease 06/23/2019   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   Procedure: FECAL TRANSPLANT;  Surgeon: Iva Boop, MD;  Location: [facility];  Service: Endoscopy;;   KNEE ARTHROSCOPY Right    Precancerous  Tissue - Nose  February 14-2014    Allergies  Allergies  Allergen Reactions   Hydrocodone Nausea And Vomiting   Prednisone Other (See Comments)    Heart races    History of Present Illness    Rhonda Andrews  is a 82 y.o. female with a PMH as mentioned above.   Previous Holter monitor in 2010 was negative for any significant arrhythmias.  During a prior admission, she had short episode of PSVT.  Has history of intolerance to several other medications to help with palpitations.  Tried midodrine for 2 weeks.   Last seen by Dr. Dina Rich on March 24, 2022.  Was doing well at the time with her palpitations on immediate release propranolol as needed.  She recently contacted our office noting elevated BP and heart rate being a little lower -see telephone note.  Was recommended she present to the office for evaluation.  Today she presents for follow-up.  She states  EKGs/Labs/Other Studies Reviewed:   The following studies were reviewed today: ***  EKG:  EKG is *** ordered today.  The ekg ordered today demonstrates ***  Recent Labs: No results found for requested labs within last 365 days.  Recent Lipid Panel    Component Value Date/Time   CHOL 219 (H) 07/02/2020 1032   TRIG 137 07/02/2020 1032   HDL 57 07/02/2020 1032   CHOLHDL 3.8 07/02/2020 1032   LDLCALC 136 (H) 07/02/2020 1032    Risk Assessment/Calculations:  {Does this patient have ATRIAL FIBRILLATION?:[id number]}  Home Medications   No outpatient medications have been marked as  taking for the 12/26/22 encounter (Appointment) with Sharlene Dory, NP.     Review of Systems   ***   All other systems reviewed and are otherwise negative except as noted above.  Physical Exam    VS:  There were no vitals taken for this visit. , BMI There is no height or weight on file to calculate BMI.  Wt Readings from Last 3 Encounters:  12/16/22 136 lb 6.4 oz (61.9 kg)  11/20/22 138 lb 12.8 oz (63 kg)  08/14/22 138 lb 9.6 oz (62.9 kg)     GEN: Well nourished, well developed, in no acute distress. HEENT: normal. Neck: Supple, no JVD, carotid bruits, or masses. Cardiac: ***RRR, no murmurs, rubs, or gallops. No clubbing, cyanosis, edema.   ***Radials/PT 2+ and equal bilaterally.  Respiratory:  ***Respirations regular and unlabored, clear to auscultation bilaterally. GI: Soft, nontender, nondistended. MS: No deformity or atrophy. Skin: Warm and dry, no rash. Neuro:  Strength and sensation are intact. Psych: Normal affect.  Assessment & Plan    ***  {Are you ordering a CV Procedure (e.g. stress test, cath, DCCV, TEE, etc)?   Press F2        :[id number]}      Disposition: Follow up {follow up:15908} with Dina Rich, MD or APP.  Signed, Sharlene Dory, NP 12/26/2022, 1:55 PM [facility]

## 2022-12-26 NOTE — Patient Instructions (Addendum)
Medication Instructions:  Your physician has recommended you make the following change in your medication:  STOP Propanolol START Propanolol ER 60 mg once a day Continue all other medications as directed  Labwork: Labs requested from PCP  Testing/Procedures: none  Follow-Up:  Your physician recommends that you schedule a follow-up appointment in: Keep scheduled appointment with Dr. Wyline Mood in September.  Any Other Special Instructions Will Be Listed Below (If Applicable).  If you need a refill on your cardiac medications before your next appointment, please call your pharmacy.

## 2022-12-30 NOTE — Telephone Encounter (Signed)
Med was approved and patient received it yesterday.

## 2023-01-02 ENCOUNTER — Telehealth: Payer: Self-pay | Admitting: *Deleted

## 2023-01-02 NOTE — Telephone Encounter (Signed)
-----   Message from Sharlene Dory, NP sent at 12/31/2022  9:01 PM EDT ----- Lab work reviewed.  Overall labs look good.  LDL is mildly elevated.  I recommend she adheres to the Mediterranean diet and gets regular cardiovascular exercise.  Follow-up with Dr. Wyline Mood as scheduled.  Sharlene Dory, AGNP-C

## 2023-01-02 NOTE — Telephone Encounter (Signed)
  Lesle Chris, LPN 09/06[birth year]1094  0:45 PM EDT Back to Top    Notified, copy to pcp.

## 2023-01-12 ENCOUNTER — Telehealth: Payer: Self-pay | Admitting: Cardiology

## 2023-01-12 NOTE — Telephone Encounter (Signed)
Pt c/o BP issue: STAT if pt c/o blurred vision, one-sided weakness or slurred speech  1. What are your last 5 BP readings? 118/67; 57; 141/82; 54; 165/91; 68  2. Are you having any other symptoms (ex. Dizziness, headache, blurred vision, passed out)?  Dizziness, headache  3. What is your BP issue? Up and down BP; says that medication isn't working

## 2023-01-13 NOTE — Telephone Encounter (Signed)
Patient notified.  States she has only been taking her Propranolol as needed for years.  Has not taken any the last 2 days.  Also says she has cut out her salt intake.    Yesterday BP readings :  130/60  67 125/64  73 125/75  71  Last evening :   111/67  65  This morning :  108/58  61  And just a few minutes ago - 98/60  63   Concerned to begin the Amlodipine with her current readings.

## 2023-01-13 NOTE — Telephone Encounter (Signed)
Propranolol is more for palpitations, it is very mild as far as blood pressure control goes. If bp's are elevated would start norvasc 2.5mg  daily, would be in addition to the propanolol  Dominga Ferry MD

## 2023-01-14 DIAGNOSIS — L570 Actinic keratosis: Secondary | ICD-10-CM | POA: Diagnosis not present

## 2023-01-14 NOTE — Telephone Encounter (Signed)
With the additional readings I would hold off on the amldopdine. Occasonal high bp's are ok, Id rather her run high at times then very low. Most recent bp's look fine.  Dominga Ferry MD

## 2023-01-15 NOTE — Telephone Encounter (Signed)
Patient notified and verbalized understanding. 

## 2023-01-20 DIAGNOSIS — D1801 Hemangioma of skin and subcutaneous tissue: Secondary | ICD-10-CM | POA: Diagnosis not present

## 2023-01-20 DIAGNOSIS — H524 Presbyopia: Secondary | ICD-10-CM | POA: Diagnosis not present

## 2023-01-20 DIAGNOSIS — H43393 Other vitreous opacities, bilateral: Secondary | ICD-10-CM | POA: Diagnosis not present

## 2023-01-20 DIAGNOSIS — H5310 Unspecified subjective visual disturbances: Secondary | ICD-10-CM | POA: Diagnosis not present

## 2023-03-17 ENCOUNTER — Ambulatory Visit: Payer: Medicare Other | Admitting: Gastroenterology

## 2023-03-18 ENCOUNTER — Ambulatory Visit: Payer: Medicare Other | Admitting: Gastroenterology

## 2023-03-18 ENCOUNTER — Encounter: Payer: Self-pay | Admitting: Gastroenterology

## 2023-03-18 VITALS — BP 127/69 | HR 69 | Temp 97.7°F | Ht 64.0 in | Wt 139.0 lb

## 2023-03-18 DIAGNOSIS — Z8619 Personal history of other infectious and parasitic diseases: Secondary | ICD-10-CM

## 2023-03-18 DIAGNOSIS — K580 Irritable bowel syndrome with diarrhea: Secondary | ICD-10-CM | POA: Diagnosis not present

## 2023-03-18 DIAGNOSIS — K219 Gastro-esophageal reflux disease without esophagitis: Secondary | ICD-10-CM

## 2023-03-18 MED ORDER — LOPERAMIDE HCL 2 MG PO TABS: 1.0000 mg | ORAL_TABLET | Freq: Three times a day (TID) | ORAL | 1 refills | Status: AC | PRN

## 2023-03-18 NOTE — Progress Notes (Addendum)
GI Office Note    Referring Provider: Donetta Potts, MD Primary Care Physician:  Donetta Potts, MD Primary Gastroenterologist: Dolores Frame, MD   Date:  03/18/2023  ID:  [name], DOB [birth year], MRN [id number]   Chief Complaint   Chief Complaint  Patient presents with   Follow-up    diarrhea   History of Present Illness  Rhonda Andrews is a 82 y.o. female with a history of recurrent C.diff s/p fecal transplant, GERD, IBS-D, anxiety, HTN, diabetes, and colitis presenting today for follow up of her chronic diarrhea.    Colonoscopy 05/21/2018 by Dr. Leone Payor: -normal perianal and DRE -TI normal -FMT performed starting in TI -No repeat colonoscopy due to age.  -Advised imodium when she returned home.  -Cautious antibiotic use.    Negative alpha gal panel 05/07/21.    Office visit 08/14/2022. Patient reported history of chronic diarrhea for multiple years since she was a child.  Frequency of diarrhea depending on her diet.  Salads, beef, greasy and fatty foods cause diarrhea.  Usually watery consistency.  Gets better with Imodium.  Takes Imodium prophylactically when going out.  Denied any nausea, vomiting, fever, chills, medic easy, melena, hematemesis, distention, abdominal pain, jaundice, pruritus, weight loss.  Takes Tums occasionally for heartburn twice per week likely due to dietary triggers.  She reported a EGD in the past but no reports available.  Advised to avoid food triggers that cause diarrhea.  Advised that she could try plant enzymes when eating salad.  Continue Imodium as needed for diarrhea.  Start Pepcid 20 mg daily at bedtime instead of PPI given history of C. difficile.    Bone density 11/14/21: osteoporosis.    Office visit 11/20/22.  Reports fast food causing her to have diarrhea when she eats healthier foods such as broccoli this gives her gas.  Overall feeling well as long she does not have frequent diarrhea.  Diarrhea not as bad  as when she had C. difficile.  Denies any abdominal pain.  Tries to limit the amount of roughage that she eats given lettuce causes her to have worsening diarrhea.  Also notes improvement in diarrhea if she does not take calcium belching but denies use of.  Using Imodium empirically prior to going out. Advised to trial plant enzymes to take when eating vegetables or salads.  Encouraged Pepcid 20 mg nightly.  Advised ongoing use of Imodium as needed for diarrhea.  Advised to follow-up in 6 months.   Patient called in 12/03/2022 reporting she was having 4-5 loose stools per day with a foul odor and was concerned about having C. difficile again.  She denies any fever, abdominal pain, or blood in stools.   C. difficile and GI pathogen panel negative 12/04/22.  Patient reported no improvement with cholestyramine in the past to help with diarrhea.  Patient concerned about bacterial overgrowth. Reported prior history of this.  Last office visit 12/16/22. Advised trial of Xifaxin and dietary avoidance of triggers.   Today: Some days not having any diarrhea and days that she has something that triggers it she may go 3-4 times. Could not tell a difference at all with Xifaxin and no improvement in diarrhea. Can not find a rhyme or reason for her diarrhea. A typical week consist of the same breakfast - dry toast and scrambled egg (within 15-20 minutes she feels bloated and will have diarrhea or it goes away). For lunch - (usually eats grilled chicken but getting tired of it) -  if cooked all the way she does not have much issue but if possibly cooked with other things or possibly not cooked all the way then she doesn't eat it.    Lately has been using imodium daily but may go 3-4 weeks.   Was trying to take lactaid pills and a probiotic and could not tell if it was helping or hindering.   Lactose and salads do bother her still. She tries to eat things that she has home grown.   Denies N/V. Has rare reflux symptoms.  Still using famotidine as needed for that.   Current Outpatient Medications  Medication Sig Dispense Refill   Calcium Carb-Cholecalciferol (CALCIUM + VITAMIN D3 PO) Take 1 tablet by mouth 2 (two) times daily.     Cholecalciferol (VITAMIN D-3) 125 MCG (5000 UT) TABS Take 1 tablet by mouth daily.     famotidine (PEPCID) 20 MG tablet Take 1 tablet (20 mg total) by mouth at bedtime. 90 tablet 3   fluticasone (FLONASE) 50 MCG/ACT nasal spray PLACE 2 SPRAYS IN BOTH NOSTRILS DAILY. 16 g 3   meclizine (ANTIVERT) 12.5 MG tablet Take 1 tablet (12.5 mg total) by mouth 3 (three) times daily as needed for dizziness. 30 tablet 2   OVER THE COUNTER MEDICATION Essential Enzymes 500 mg 1/2 daily prn     propranolol ER (INDERAL LA) 60 MG 24 hr capsule Take 1 capsule (60 mg total) by mouth daily. 180 capsule 1   rifaximin (XIFAXAN) 550 MG TABS tablet Take 1 tablet (550 mg total) by mouth 3 (three) times daily. (Patient not taking: Reported on 12/26/2022) 42 tablet 0   No current facility-administered medications for this visit.    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)    Hypertension    IBS (irritable bowel syndrome)    Lactose intolerance    Palpitation    Palpitations    Vitamin D deficiency disease 06/23/2019    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   Procedure: FECAL TRANSPLANT;  Surgeon: Iva Boop, MD;  Location: [facility];  Service: Endoscopy;;   KNEE ARTHROSCOPY Right     Precancerous  Tissue - Nose  February 14-2014    Family History  Problem Relation Age of Onset   Lymphoma Mother    Colon cancer Mother    Hypertension Mother    Migraines Mother    Other Father        Wagner's Granulermatosis   Seizures Sister    Cancer Maternal Grandmother        brain   Cancer Maternal Grandfather        brain   Other Paternal Grandmother        blood clot - DVT following surgery   Alcohol abuse Paternal Grandfather    Cirrhosis Paternal Grandfather    Thyroid disease Sister    Heart disease Sister     Allergies as of 03/18/2023 - Review Complete 03/18/2023  Allergen  Reaction Noted   Hydrocodone Nausea And Vomiting 05/19/2018   Prednisone Other (See Comments) 10/28/2014    Social History   Socioeconomic History   Marital status: Married    Spouse name: Not on file   Number of children: 1   Years of education: Not on file   Highest education level: Some college, no degree  Occupational History   Not on file  Tobacco Use   Smoking status: Never   Smokeless tobacco: Never  Vaping Use   Vaping status: Never Used  Substance and Sexual Activity   Alcohol use: No    Alcohol/week: 0.0 standard drinks of alcohol   Drug use: No   Sexual activity: Not Currently  Other Topics Concern   Not on file  Social History Narrative   Married. Has a daughter. 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   Eats all food groups and has a great appetite.    Walks in house daily in her house, 1.5 miles a day.   Right handed   Quit drinking caffeine 15 years ago   No EtOH/tobacco   Social Determinants of Health   Financial Resource Strain: Not on file  Food Insecurity: Not on file  Transportation Needs: Not on file  Physical Activity: Inactive (07/17/2017)   Exercise Vital Sign    Days of Exercise per Week: 0 days    Minutes of Exercise per Session: 0 min  Stress: No Stress Concern Present  (07/17/2017)   [employer] of Occupational Health - Occupational Stress Questionnaire    Feeling of Stress : Only a little  Social Connections: Socially Integrated (07/17/2017)   Social Connection and Isolation Panel [NHANES]    Frequency of Communication with Friends and Family: More than three times a week    Frequency of Social Gatherings with Friends and Family: More than three times a week    Attends Religious Services: More than 4 times per year    Active Member of [employer] or Organizations: Yes    Attends Engineer, structural: More than 4 times per year    Marital Status: Married   Review of Systems   Gen: Denies fever, chills, anorexia. Denies fatigue, weakness, weight loss.  CV: Denies chest pain, palpitations, syncope, peripheral edema, and claudication. Resp: Denies dyspnea at rest, cough, wheezing, coughing up blood, and pleurisy. GI: See HPI Derm: Denies rash, itching, dry skin Psych: Denies depression, anxiety, memory loss, confusion. No homicidal or suicidal ideation.  Heme: Denies bruising, bleeding, and enlarged lymph nodes.  Physical Exam   BP 127/69 (BP Location: Left Arm, Patient Position: Sitting, Cuff Size: Normal)   Pulse 69   Temp 97.7 F (36.5 C) (Temporal)   Ht 5\' 4"  (1.626 m)   Wt 139 lb (63 kg)   SpO2 100%   BMI 23.86 kg/m   General:   Alert and oriented. No distress noted. Pleasant and cooperative.  Head:  Normocephalic and atraumatic. Eyes:  Conjuctiva clear without scleral icterus. Mouth:  Oral mucosa pink and moist. Good dentition. No lesions. Abdomen:  +BS, soft, non-tender and non-distended. No rebound or guarding. No HSM or masses noted.  Rectal: deferred Msk:  Symmetrical without gross deformities. Normal posture. Extremities:  Without edema. Neurologic:  Alert and  oriented x4 Psych:  Alert and cooperative. Normal mood and affect.   Assessment  Rhonda Andrews is a 82 y.o. female with a history of recurrent C.diff s/p  fecal transplant,  GERD, IBS-D, anxiety, HTN, diabetes, and colitis presenting today for follow up of her chronic diarrhea.    History of recurrent C.diff: History of recurrent C. difficile, s/p fecal transplant in 2019.  Has continued to have looser stools despite fecal transplant although not as severe as when C. difficile was present.   IBS-D: Continues to have chronic diarrhea exacerbated by roughage and vegetables.  Has tried plant enzymes which she failed given abdominal cramping/pain.  Has tried probiotics in the past however has had difficulty finding a brand that does not contain lactose.  Continues to have a lot of abdominal cramping.  Recent 2-week trial of Xifaxan was not effective. Continues to experience 3-4 loose stools at a time on days it does occur but recently has been tacking total 2 mg imodium daily. Will trial visibiome probiotic given this is lactose free and advised to continue scheduled imodium.   GERD: Well controlled with diet and famotidine as needed.   PLAN   Continue 1 mg imodium prior to meals as needed, hold if no BM in 24 hours Avoid trigger foods Continue famotidine as needed at bedtime.  Trial Visibiome [facility] probiotics.  Avoid lactose, use lactaid as needed Follow up in 6 months    Brooke Bonito, MSN, FNP-BC, AGACNP-BC [facility]  I have reviewed the note and agree with the APP's assessment as described in this progress note  May consider referral for SIBO/SIMO testing at [facility] and sucrose breath test if symptoms persist.  Katrinka Blazing, MD Gastroenterology and Hepatology [facility]

## 2023-03-18 NOTE — Patient Instructions (Addendum)
Try Visibiome daily given this is lactose free. You can get this from behind the pharmacy counter without a prescription.  There is a discount barcode on the back of the sample box.  Continue to avoid your triggers including lactose.  Continue famotidine as needed at bedtime for reflux.  For your diarrhea I would recommend scheduling 1 mg of Imodium prior to meals and hold if no bowel movement in 24 hours.  I have attempted to send in prescription to your pharmacy to see if insurance will pay for it rather than obtaining it over-the-counter.  It was a pleasure to see you today! I want to create trusting relationships with patients. If you receive a survey regarding your visit,  I greatly appreciate you taking time to fill this out on paper or through your MyChart. I value your feedback.  Brooke Bonito, MSN, FNP-BC, AGACNP-BC [facility]

## 2023-03-29 ENCOUNTER — Other Ambulatory Visit: Payer: Self-pay | Admitting: Cardiology

## 2023-03-29 ENCOUNTER — Telehealth: Payer: Self-pay | Admitting: Cardiology

## 2023-03-29 MED ORDER — AMLODIPINE BESYLATE 2.5 MG PO TABS: 2.5000 mg | ORAL_TABLET | Freq: Every day | ORAL | 11 refills | Status: DC

## 2023-03-29 NOTE — Telephone Encounter (Signed)
Pt called with elevated BP 165 systolic and pulse 50   she does not take propranolol daily and has not taken in several days.  I called in amlodipine 2.5 mg daily, low dose because some days BP stable.     Triage can you touch base with pt to see how she did with new medication and have her come in to be seen in 1-2 weeks please.   Thank you

## 2023-03-30 NOTE — Telephone Encounter (Signed)
Patient notified.   Please schedule pt 1-2w with JB/APP- pt prefers [place] but will travel to [place] if needed.

## 2023-03-30 NOTE — Telephone Encounter (Signed)
Pt c/o medication issue:  1. Name of Medication:   amLODipine (NORVASC) 2.5 MG tablet   2. How are you currently taking this medication (dosage and times per day)? 1 tablet per day  3. Are you having a reaction (difficulty breathing--STAT)?   4. What is your medication issue?   Patient stated she only took one tablet and she thought it was extended release and it is not.  Patient stated she work up with elevated BP.  BP 175/105 (this morning, 6:20 am) 150/74  (8:40 am)

## 2023-03-30 NOTE — Telephone Encounter (Signed)
If high bp's would try amlodpine. There is only one version of norvasc, there is not a short or long acting.   Dominga Ferry MD

## 2023-03-31 ENCOUNTER — Ambulatory Visit: Payer: Medicare Other | Attending: Nurse Practitioner | Admitting: Nurse Practitioner

## 2023-03-31 ENCOUNTER — Encounter: Payer: Self-pay | Admitting: Nurse Practitioner

## 2023-03-31 DIAGNOSIS — R42 Dizziness and giddiness: Secondary | ICD-10-CM

## 2023-03-31 DIAGNOSIS — I10 Essential (primary) hypertension: Secondary | ICD-10-CM

## 2023-03-31 DIAGNOSIS — R002 Palpitations: Secondary | ICD-10-CM | POA: Diagnosis not present

## 2023-03-31 DIAGNOSIS — R001 Bradycardia, unspecified: Secondary | ICD-10-CM

## 2023-03-31 MED ORDER — AMLODIPINE BESYLATE 2.5 MG PO TABS: ORAL_TABLET | ORAL | 11 refills | Status: DC

## 2023-03-31 NOTE — Patient Instructions (Addendum)
Medication Instructions:  Your physician has recommended you make the following change in your medication:  Stop taking Propanolol You may take 1 extra tablet of Amlodipine daily if your Blood Pressure top number remains above 140 Continue all other medications as prescribed   Labwork: None  Testing/Procedures: None  Follow-Up: Your physician recommends that you schedule a follow-up appointment in: 4-6 weeks telephone Rhonda Andrews   Any Other Special Instructions Will Be Listed Below (If Applicable).   If you need a refill on your cardiac medications before your next appointment, please call your pharmacy.

## 2023-03-31 NOTE — Progress Notes (Unsigned)
Office Visit    Patient Name: Rhonda Andrews Date of Encounter: 03/31/2023 PCP:  Donetta Potts, MD [facility]  Cardiologist:  Dina Rich, MD  Advanced Practice Provider:  No care team member to display Electrophysiologist:  None   Chief Complaint and HPI    Rhonda Andrews is a 82 y.o. female with a hx of palpitations, dizziness, hx of recurrent C. Diff, IBS, and anxiety, who presents today for follow-up evaluation.   Previous Holter monitor in 2010 was negative for any significant arrhythmias.  During a prior admission, she had short episode of PSVT.  Has history of intolerance to several other medications to help with palpitations.  Tried midodrine for 2 weeks.   Last seen by Dr. Dina Rich on March 24, 2022.  Was doing well at the time with her palpitations on immediate release propranolol as needed.  Last saw patient on December 26, 2022 after she had noticed BP was elevated, self medicated with propranolol.  Heart rate noted to be around 48 bpm on monitor.  At that time, was requesting to be on propranolol ER.  Overall was doing well from a cardiac perspective.  Was switched to propranolol 60 mg extended release daily tablet.  Contact our office in May reporting some elevated BPs.  Dr. Wyline Mood recommended starting Norvasc 2.5 mg daily in addition to propranolol.  Today she presents for follow-up.  States she continues to take propranolol daily and pulse rate has been found to be too low at 50 bpm, causes her to be dizzy.  Has some questions regarding amlodipine. Denies any chest pain, shortness of breath, syncope, presyncope, orthopnea, PND, swelling or significant weight changes, acute bleeding, or claudication.  EKGs/Labs/Other Studies Reviewed:   The following studies were reviewed today:   EKG:  EKG is not ordered today.    Echo 2018: Study Conclusions   - Left ventricle: The cavity size was normal. Wall thickness was     increased in a pattern of mild LVH. Systolic function was    vigorous. The estimated ejection fraction was in the range of 65%    to 70%. Wall motion was normal; there were no regional wall    motion abnormalities. Doppler parameters are consistent with    abnormal left ventricular relaxation (grade 1 diastolic    dysfunction).  - Mitral valve: There was mild regurgitation.  - Right atrium: Central venous pressure (est): 3 mm Hg.  - Tricuspid valve: There was trivial regurgitation.  - Pulmonary arteries: PA peak pressure: 20 mm Hg (S).  - Pericardium, extracardiac: A prominent pericardial fat pad was    present.   Impressions:   - Mild LVH with LVEF 65-70% and grade 1 diastolic dysfunction. Mild    mitral regurgitation. Trivial tricuspid regurgitation with PASP    20 mmHg. Prominent pericardial fat pad.Study Conclusions   - Left ventricle: The cavity size was normal. Wall thickness was    increased in a pattern of mild LVH. Systolic function was    vigorous. The estimated ejection fraction was in the range of 65%    to 70%. Wall motion was normal; there were no regional wall    motion abnormalities. Doppler parameters are consistent with    abnormal left ventricular relaxation (grade 1 diastolic    dysfunction).  - Mitral valve: There was mild regurgitation.  - Right atrium: Central venous pressure (est): 3 mm Hg.  - Tricuspid valve: There was trivial regurgitation.  - Pulmonary  arteries: PA peak pressure: 20 mm Hg (S).  - Pericardium, extracardiac: A prominent pericardial fat pad was    present.   Impressions:   - Mild LVH with LVEF 65-70% and grade 1 diastolic dysfunction. Mild    mitral regurgitation. Trivial tricuspid regurgitation with PASP    20 mmHg. Prominent pericardial fat pad.  Risk Assessment/Calculations:    The ASCVD Risk score (Arnett DK, et al., 2019) failed to calculate for the following reasons:   The 2019 ASCVD risk score is only valid for ages 58 to  5  Review of Systems    All other systems reviewed and are otherwise negative except as noted above.  Physical Exam    VS:  BP 122/80   Pulse 61   Ht 5\' 4"  (1.626 m)   Wt 135 lb 12.8 oz (61.6 kg)   SpO2 94%   BMI 23.31 kg/m  , BMI Body mass index is 23.31 kg/m.  Wt Readings from Last 3 Encounters:  03/31/23 135 lb 12.8 oz (61.6 kg)  03/18/23 139 lb (63 kg)  12/26/22 137 lb 6.4 oz (62.3 kg)     GEN: Well nourished, well developed, in no acute distress. HEENT: normal. Neck: Supple, no JVD, carotid bruits, or masses. Cardiac: S1/S2, RRR, no murmurs, rubs, or gallops. No clubbing, cyanosis, edema.  Radials/PT 2+ and equal bilaterally.  Respiratory:  Respirations regular and unlabored, clear to auscultation bilaterally. MS: No deformity or atrophy. Skin: Warm and dry, no rash. Neuro:  Strength and sensation are intact. Psych: Normal affect.  Assessment & Plan    Palpitations, bradycardia, dizziness Denies any recent palpitations or tachycardia.  Does admit that propranolol is causing her heart rate to get too low, therefore causing her to get dizzy.  Will stop propranolol at this time, and continue to monitor for now.  Has history of intolerance to several other medications to help with palpitations. She will call and let us know if she develops significant palpitations and then would recommend starting metoprolol 12.5 mg twice daily as needed for palpitations.   2.  Hypertension Blood pressure stable today.  Continue amlodipine 2.5 mg daily.  Discussed she may take 1 extra tablet daily as needed if systolic blood pressure is higher than 140 later in day.  Discussed to monitor BP at home at least 2 hours after medications and sitting for 5-10 minutes. Heart healthy diet and regular cardiovascular exercise encouraged.    Disposition: Follow up with Dina Rich, MD or APP in 4 to 6 weeks via telephone visit or sooner if anything changes.   Signed, Sharlene Dory,  NP 04/02/2023, 2:36 PM [facility]

## 2023-04-13 NOTE — Progress Notes (Deleted)
Cardiology Office Note:  .   Date:  04/13/2023  ID:  Rhonda Andrews, DOB 1941/01/06, MRN [id number] PCP: Donetta Potts, MD  [facility] Providers Cardiologist:  Dina Rich, MD { Click to update primary MD,subspecialty MD or APP then REFRESH:1}   History of Present Illness: Marland Kitchen   Rhonda Andrews is a 82 y.o. female with a hx of palpitations, HTN, dizziness, hx of recurrent C. Diff, IBS, and anxiety,  Patient developed bradycardia and hypotension on propanolol and started on metoprolol prn  ROS: ***  Studies Reviewed: Marland Kitchen         Prior CV Studies: {Select studies to display:26339}  Echo 2018: Study Conclusions   - Left ventricle: The cavity size was normal. Wall thickness was    increased in a pattern of mild LVH. Systolic function was    vigorous. The estimated ejection fraction was in the range of 65%    to 70%. Wall motion was normal; there were no regional wall    motion abnormalities. Doppler parameters are consistent with    abnormal left ventricular relaxation (grade 1 diastolic    dysfunction).  - Mitral valve: There was mild regurgitation.  - Right atrium: Central venous pressure (est): 3 mm Hg.  - Tricuspid valve: There was trivial regurgitation.  - Pulmonary arteries: PA peak pressure: 20 mm Hg (S).  - Pericardium, extracardiac: A prominent pericardial fat pad was    present.   Impressions:   - Mild LVH with LVEF 65-70% and grade 1 diastolic dysfunction. Mild    mitral regurgitation. Trivial tricuspid regurgitation with PASP    20 mmHg. Prominent pericardial fat pad.Study Conclusions   - Left ventricle: The cavity size was normal. Wall thickness was    increased in a pattern of mild LVH. Systolic function was    vigorous. The estimated ejection fraction was in the range of 65%    to 70%. Wall motion was normal; there were no regional wall    motion abnormalities. Doppler parameters are consistent with    abnormal left ventricular  relaxation (grade 1 diastolic    dysfunction).  - Mitral valve: There was mild regurgitation.  - Right atrium: Central venous pressure (est): 3 mm Hg.  - Tricuspid valve: There was trivial regurgitation.  - Pulmonary arteries: PA peak pressure: 20 mm Hg (S).  - Pericardium, extracardiac: A prominent pericardial fat pad was    present.   Impressions:   - Mild LVH with LVEF 65-70% and grade 1 diastolic dysfunction. Mild    mitral regurgitation. Trivial tricuspid regurgitation with PASP    20 mmHg. Prominent pericardial fat pad.  Risk Assessment/Calculations:   {Does this patient have ATRIAL FIBRILLATION?[phone]} No BP recorded.  {Refresh Note OR Click here to enter BP  :1}***       Physical Exam:   VS:  There were no vitals taken for this visit.   Wt Readings from Last 3 Encounters:  03/31/23 135 lb 12.8 oz (61.6 kg)  03/18/23 139 lb (63 kg)  12/26/22 137 lb 6.4 oz (62.3 kg)    GEN: Well nourished, well developed in no acute distress NECK: No JVD; No carotid bruits CARDIAC: ***RRR, no murmurs, rubs, gallops RESPIRATORY:  Clear to auscultation without rales, wheezing or rhonchi  ABDOMEN: Soft, non-tender, non-distended EXTREMITIES:  No edema; No deformity   ASSESSMENT AND PLAN: .     Palpitations/bradycardia/hypotenion-propranolol stopped 03/2023 and given metoprolol prn  HTN    {Are you ordering a CV Procedure (  e.g. stress test, cath, DCCV, TEE, etc)?   Press F2        :[id number]}  Dispo: ***  Signed, Jacolyn Reedy, PA-C

## 2023-04-16 ENCOUNTER — Telehealth: Payer: Self-pay | Admitting: *Deleted

## 2023-04-16 NOTE — Telephone Encounter (Signed)
Had been on propanolol for years, often unconventionally cutting into quarters because that's the only dose she was able to tolerate. From her last visit with NP Philis Nettle was to discontinue propranolol. Dizziness has been a long term issue for her, would not be able to resolve on a phone note. Can she see me in 2 weeks, hospital day 220pm would be fine.  Dominga Ferry MD

## 2023-04-16 NOTE — Telephone Encounter (Signed)
Pt in office with husband for visit. Pt states that she feels dizzy d/t BP. Blood pressure today is 144/80 Hr 70. She states that she takes Propranolol and Norvasc as needed for elevated BP. She states that she takes Norvasc 2.5 and Propranolol 1/2 of a 1/4 a tablet in the morning at 4 am then again when she wakes up for the day. Pt reports that her blood pressure drops at dinner time. Informed pt that Propranolol is not listed on her med list. She stated that it should be there because she has been taking it for years. Pt states that she carries her pills in her pocket just in case. Please advise.

## 2023-04-17 NOTE — Telephone Encounter (Signed)
Appt made with Dr. Wyline Mood on 8/30 at 2:20 pm.

## 2023-04-27 ENCOUNTER — Ambulatory Visit: Payer: Medicare Other | Admitting: Physician Assistant

## 2023-04-27 DIAGNOSIS — R002 Palpitations: Secondary | ICD-10-CM

## 2023-04-27 DIAGNOSIS — I10 Essential (primary) hypertension: Secondary | ICD-10-CM

## 2023-04-28 ENCOUNTER — Encounter: Payer: Self-pay | Admitting: Gastroenterology

## 2023-04-29 ENCOUNTER — Ambulatory Visit: Payer: Medicare Other | Admitting: Cardiology

## 2023-04-29 ENCOUNTER — Encounter: Payer: Self-pay | Admitting: Cardiology

## 2023-04-29 ENCOUNTER — Ambulatory Visit: Payer: Medicare Other | Attending: Cardiology | Admitting: Cardiology

## 2023-04-29 VITALS — BP 159/79 | HR 80 | Ht 65.5 in | Wt 139.4 lb

## 2023-04-29 DIAGNOSIS — R002 Palpitations: Secondary | ICD-10-CM | POA: Diagnosis not present

## 2023-04-29 DIAGNOSIS — R42 Dizziness and giddiness: Secondary | ICD-10-CM

## 2023-04-29 NOTE — Progress Notes (Signed)
Clinical Summary Ms. Gehring is a 82 y.o.female seen today for follow up of the following medical problems.   1. Palpitations/Dizziness - previous holter 05/2009 showed no significant arrhythmias.   - during prior admission did have short episode of PSVT - she was changed from propanolol to atenolol. With change had increase in symptoms and she changed herself back to propanolol - she reports short acting propanolol does not work as well, prefers long acting.    - diltiazem caused low bp's at home per her report, she stopped taking.     - tried midodine x 2 weeks, SBP 130s. Reports insomnia and she stopped taking. We had tried to see if could stabilize her bp to allow more stable use of her beta blocker   - previously she was on an unconvnetional way of taking propranolol long acting that she has done for many years, she splits the individual capsule into 2 separate doses. We have tried several other doses and meds which she did not tolerate and thus have accepted this atypical regimen.        - some dizziness after norvasc and headache, some LE edema. This was recently started for high bp.   - in mornings bp 160s/80s, in afternoon 115 and in the night 100-115 without medications.  - takes prn propanolol only if HRs in the 90 - rare palpitations.       2. 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     Cholecalciferol (VITAMIN D-3) 125 MCG (5000 UT) TABS Take 1 tablet by mouth daily.     famotidine (PEPCID) 20 MG tablet Take 1 tablet (20 mg total) by mouth at bedtime. 90 tablet 3   fluticasone (FLONASE) 50 MCG/ACT nasal spray PLACE 2 SPRAYS IN BOTH NOSTRILS DAILY. 16 g 3   loperamide (IMODIUM A-D) 2 MG tablet Take 0.5 tablets (1 mg total) by mouth 3 (three) times daily as needed for diarrhea or loose stools. Can take before each meal if needed. 90 tablet 1   meclizine (ANTIVERT) 12.5 MG tablet Take 1 tablet (12.5 mg total) by mouth 3 (three) times daily as needed for dizziness. 30 tablet 2   OVER THE COUNTER MEDICATION Essential Enzymes 500 mg 1/2 daily prn     No current facility-administered medications for this visit.     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   Procedure: FECAL TRANSPLANT;  Surgeon: Iva Boop, MD;  Location: [facility];  Service: Endoscopy;;   KNEE ARTHROSCOPY Right    Precancerous  Tissue - Nose  February 14-2014     Allergies  Allergen Reactions   Hydrocodone Nausea And Vomiting   Prednisone Other (See Comments)    Heart races      Family History  Problem Relation Age of Onset   Lymphoma Mother    Colon cancer Mother    Hypertension Mother    Migraines Mother    Other Father        Wagner's Granulermatosis   Seizures Sister    Cancer Maternal Grandmother        brain   Cancer Maternal Grandfather        brain   Other Paternal  Grandmother        blood clot - DVT following surgery   Alcohol abuse Paternal Grandfather    Cirrhosis Paternal Grandfather    Thyroid disease Sister    Heart disease Sister      Social History Ms. Lorig reports that she has never smoked. She has never used smokeless tobacco. Ms. Vittum reports no history of alcohol use.   Review of Systems CONSTITUTIONAL: No weight loss, fever, chills, weakness or fatigue.  HEENT: Eyes: No visual loss, blurred vision, double vision or yellow sclerae.No hearing loss, sneezing, congestion, runny nose or sore throat.  SKIN: No rash or itching.  CARDIOVASCULAR: per hpi RESPIRATORY: No shortness of breath, cough or sputum.  GASTROINTESTINAL: No anorexia, nausea, vomiting or diarrhea. No abdominal pain or blood.  GENITOURINARY: No burning on urination, no polyuria NEUROLOGICAL: No headache, dizziness, syncope, paralysis, ataxia, numbness or tingling in the extremities. No change in bowel or bladder control.  MUSCULOSKELETAL: No muscle, back pain, joint pain or stiffness.  LYMPHATICS: No enlarged nodes. No history of splenectomy.  PSYCHIATRIC: No history of depression or anxiety.  ENDOCRINOLOGIC: No reports of sweating, cold or heat intolerance. No polyuria or polydipsia.  Marland Kitchen   Physical Examination Today's Vitals   04/29/23 1406  BP: (!) 159/79  Pulse: 80  SpO2: 96%  Weight: 139 lb 6.4 oz (63.2 kg)  Height: 5' 5.5" (1.664 m)   Body mass index is 22.84 kg/m.  Gen: resting comfortably, no acute distress HEENT: no scleral icterus, pupils equal round and reactive, no palptable cervical adenopathy,  CV: RRR, no m/rg, no jvd Resp: Clear to auscultation bilaterally GI: abdomen is soft, non-tender, non-distended, normal bowel sounds, no hepatosplenomegaly MSK: extremities are warm, no edema.  Skin: warm, no rash Neuro:  no focal deficits Psych: appropriate affect   Diagnostic Studies     Assessment and Plan   1. Palpitations -  long history, only medication she has tolerated is prn propanolol as outlined above - no recent symptoms  2. Dizziness - over 10 year history of symptoms - worsended by recently starting norvasc 2.5mg  daily. She does have some high bp's in the AM but without meds in the afternoon and evening SBPs are in 110s per her report. Elevated today but reports was rushing to get here, 2 appts in June show 120s/60s-80s - d/c norvasc and monitor bp's and dizziness, continue aggress hydration.  -update [place] on bp's in 1 week.     Antoine Poche, M.D.

## 2023-04-29 NOTE — Patient Instructions (Signed)
Medication Instructions:  Your physician has recommended you make the following change in your medication:   -Stop Amlodipine   *If you need a refill on your cardiac medications before your next appointment, please call your pharmacy*   Lab Work: None If you have labs (blood work) drawn today and your tests are completely normal, you will receive your results only by: MyChart Message (if you have MyChart) OR A paper copy in the mail If you have any lab test that is abnormal or we need to change your treatment, we will call you to review the results.   Testing/Procedures: None   Follow-Up: At [facility], you and your health needs are our priority.  As part of our continuing mission to provide you with exceptional heart care, we have created designated Provider Care Teams.  These Care Teams include your primary Cardiologist (physician) and Advanced Practice Providers (APPs -  Physician Assistants and Nurse Practitioners) who all work together to provide you with the care you need, when you need it.  We recommend signing up for the patient portal called "MyChart".  Sign up information is provided on this After Visit Summary.  MyChart is used to connect with patients for Virtual Visits (Telemedicine).  Patients are able to view lab/test results, encounter notes, upcoming appointments, etc.  Non-urgent messages can be sent to your provider as well.   To learn more about what you can do with MyChart, go to [email or web address].    Your next appointment:    Keep follow up scheduled  Provider:   Dina Rich, MD    Other Instructions Return BP log in 1 week to our office.

## 2023-05-01 ENCOUNTER — Ambulatory Visit: Payer: Medicare Other | Admitting: Cardiology

## 2023-05-07 ENCOUNTER — Encounter: Payer: Self-pay | Admitting: Cardiology

## 2023-05-13 ENCOUNTER — Ambulatory Visit: Payer: Medicare Other | Admitting: Nurse Practitioner

## 2023-05-14 ENCOUNTER — Ambulatory Visit (INDEPENDENT_AMBULATORY_CARE_PROVIDER_SITE_OTHER): Payer: Medicare Other | Admitting: Gastroenterology

## 2023-05-14 ENCOUNTER — Encounter (INDEPENDENT_AMBULATORY_CARE_PROVIDER_SITE_OTHER): Payer: Self-pay | Admitting: Gastroenterology

## 2023-05-14 VITALS — BP 145/77 | HR 70 | Temp 97.5°F | Ht 65.0 in | Wt 136.8 lb

## 2023-05-14 DIAGNOSIS — R140 Abdominal distension (gaseous): Secondary | ICD-10-CM | POA: Diagnosis not present

## 2023-05-14 DIAGNOSIS — K580 Irritable bowel syndrome with diarrhea: Secondary | ICD-10-CM | POA: Diagnosis not present

## 2023-05-14 DIAGNOSIS — K219 Gastro-esophageal reflux disease without esophagitis: Secondary | ICD-10-CM | POA: Diagnosis not present

## 2023-05-14 NOTE — Patient Instructions (Addendum)
Will refer to [facility] for SIBO breath test Perform sucrase breath test. Perform blood workup Continue with Imodium as needed Continue with famotidine as needed for heartburn

## 2023-05-14 NOTE — Progress Notes (Unsigned)
Rhonda Andrews, M.D. Gastroenterology & Hepatology [facility] [address] [place], [place] [address]  Primary Care Physician: Donetta Potts, MD [address] [place] [address]  I will communicate my assessment and recommendations to the referring MD via EMR.  Problems: Chronic diarrhea, likely due to IBS-D Lactose intolerance. History of C. difficile colitis requiring fecal transplant  History of Present Illness: EMMILIA Andrews is a 82 y.o. female with a history of recurrent C.diff s/p fecal transplant, GERD, IBS-D, anxiety, HTN, diabetes, and colitis presents to the clinic for follow up of chronic diarrhea.  The patient was last seen on 03/18/2023. At that time, the patient was advised to continue Imodium 1 mg as needed prior to meals and famotidine.  She was also advised to use a probiotic on a daily basis.  Patient reports that she is still having intermittent diarrhea. She states a 1/3 of the  she has 5-6 bowel movements, while 2/3 of the time she may have 1-2 bowel movements per day. Stools are usually are formed. She is taking Imodium once a day, but may take half of a tablet if she is having few Bms. Most of her Bms are in the mornign, but no issues during the night. If she eats broccoli, spicy food or roughage, she may have some abdominal discomfort and her stool can be loose. The patient denies having any nausea, vomiting, fever, chills, hematochezia, melena, hematemesis, jaundice, pruritus or weight loss.  Notably, the patient was given a trial of Xifaxan in April 2024 - she picked the medication from the pharmacy but has not taken the medicine as she was scared of causing a C. Diff relapse.  She had stool testing for C. difficile in April 2024 which was negative.  She had reported no improvement of her diarrhea with the use of cholestyramine.  The patient has been given Pepcid at bedtime for GERD related  episodes.  Patient reports that she had SIBO in the past, which was treated at [facility] with Xifaxan - symptoms subsided for 3 years at that time.  States that she takes famotidine as needed for heartburn.  Last Colonoscopy: Last Colonoscopy: 2019 The perianal and digital rectal examinations were normal. A few diverticula were found in the sigmoid colon. The terminal ileum appeared normal. The exam was otherwise without abnormality on direct and retroflexion views. Fecal Microbiota Transplant (Bacteriotherapy): Donor stool was prepared by a third party (purchased) as per protocol. Approximately 450 mL of the emulsified donor stool was instilled in the terminal ileum. A detailed colonoscopic detailed colonoscopic exam could not be performed upon scope withdrawal secondary to limited visibility from the instilled stool.  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)    Hypertension    IBS (irritable bowel syndrome)    Lactose intolerance    Palpitation    Palpitations    Vitamin D deficiency disease 06/23/2019    Past Surgical History: Past Surgical History:  Procedure Laterality Date   ABDOMINAL HYSTERECTOMY     ovaries remained   CHOLECYSTECTOMY     COLONOSCOPY  2010   COLONOSCOPY WITH PROPOFOL N/A 05/21/2018   Procedure: COLONOSCOPY WITH PROPOFOL;  Surgeon: Iva Boop, MD;  Location: [facility];  Service: Endoscopy;  Laterality:  N/A;  with FMT   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   Procedure: FECAL TRANSPLANT;  Surgeon: Iva Boop, MD;  Location: [facility];  Service: Endoscopy;;   KNEE ARTHROSCOPY Right    Precancerous  Tissue - Nose  February 14-2014    Family History: Family History  Problem Relation Age of Onset   Lymphoma Mother    Colon cancer Mother     Hypertension Mother    Migraines Mother    Other Father        Wagner's Granulermatosis   Seizures Sister    Cancer Maternal Grandmother        brain   Cancer Maternal Grandfather        brain   Other Paternal Grandmother        blood clot - DVT following surgery   Alcohol abuse Paternal Grandfather    Cirrhosis Paternal Grandfather    Thyroid disease Sister    Heart disease Sister     Social History: Social History   Tobacco Use  Smoking Status Never  Smokeless Tobacco Never   Social History   Substance and Sexual Activity  Alcohol Use No   Alcohol/week: 0.0 standard drinks of alcohol   Social History   Substance and Sexual Activity  Drug Use No    Allergies: Allergies  Allergen Reactions   Hydrocodone Nausea And Vomiting   Prednisone Other (See Comments)    Heart races    Medications: Current Outpatient Medications  Medication Sig Dispense Refill   Calcium Carb-Cholecalciferol (CALCIUM + VITAMIN D3 PO) Take 1 tablet by mouth 2 (two) times daily.     Cholecalciferol (VITAMIN D-3) 125 MCG (5000 UT) TABS Take 1 tablet by mouth daily.     famotidine (PEPCID) 20 MG tablet Take 1 tablet (20 mg total) by mouth at bedtime. 90 tablet 3   fluticasone (FLONASE) 50 MCG/ACT nasal spray PLACE 2 SPRAYS IN BOTH NOSTRILS DAILY. 16 g 3   loperamide (IMODIUM A-D) 2 MG tablet Take 0.5 tablets (1 mg total) by mouth 3 (three) times daily as needed for diarrhea or loose stools. Can take before each meal if needed. (Patient taking differently: Take 1 mg by mouth daily at 6 (six) AM. Can take before each meal if needed.) 90 tablet 1   meclizine (ANTIVERT) 12.5 MG tablet Take 1 tablet (12.5 mg total) by mouth 3 (three) times daily as needed for dizziness. 30 tablet 2   OVER THE COUNTER MEDICATION Essential Enzymes 500 mg 1/2 daily prn     No current facility-administered medications for this visit.    Review of Systems: GENERAL: negative for malaise, night sweats HEENT: No  changes in hearing or vision, no nose bleeds or other nasal problems. NECK: Negative for lumps, goiter, pain and significant neck swelling RESPIRATORY: Negative for cough, wheezing CARDIOVASCULAR: Negative for chest pain, leg swelling, palpitations, orthopnea GI: SEE HPI MUSCULOSKELETAL: Negative for joint pain or swelling, back pain, and muscle pain. SKIN: Negative for lesions, rash PSYCH: Negative for sleep disturbance, mood disorder and recent psychosocial stressors. HEMATOLOGY Negative for prolonged bleeding, bruising easily, and swollen nodes. ENDOCRINE: Negative for cold or heat intolerance, polyuria, polydipsia and goiter. NEURO: negative for tremor, gait imbalance, syncope and seizures. The remainder of the review of systems is noncontributory.   Physical Exam: BP (!) 145/77 (BP Location: Left Arm, Patient Position: Sitting, Cuff Size: Normal)   Pulse 70   Temp (!) 97.5 F (36.4 C) (Temporal)   Ht 5\' 5"  (1.651 m)  Wt 136 lb 12.8 oz (62.1 kg)   BMI 22.76 kg/m  GENERAL: The patient is AO x3, in no acute distress. HEENT: Head is normocephalic and atraumatic. EOMI are intact. Mouth is well hydrated and without lesions. NECK: Supple. No masses LUNGS: Clear to auscultation. No presence of rhonchi/wheezing/rales. Adequate chest expansion HEART: RRR, normal s1 and s2. ABDOMEN: Soft, nontender, no guarding, no peritoneal signs, and nondistended. BS +. No masses. EXTREMITIES: Without any cyanosis, clubbing, rash, lesions or edema. NEUROLOGIC: AOx3, no focal motor deficit. SKIN: no jaundice, no rashes  Imaging/Labs: as above  I personally reviewed and interpreted the available labs, imaging and endoscopic files.  Impression and Plan: BRIXLEY DIOGUARDI is a 82 y.o. female with a history of recurrent C.diff s/p fecal transplant, GERD, IBS-D, anxiety, HTN, diabetes, and colitis presents to the clinic for follow up of chronic diarrhea.  Patient has presented recurrent episodes of  diarrhea and bloating intermittently for multiple years.  This has been attributed to IBS-D.  In fact she was given a course of Xifaxan for management of IBS-D but she has been fearful to take this medication as she is concerned about having a recurrent C. difficile flare.  We discussed that we could do SIBO breath test to determine if she indeed has some bacterial overgrowth which will benefit from Xifaxan treatment.  The patient has the pills at home and will take this medications if her SIBO breath test comes back abnormal.  Other etiologies of chronic bloating and diarrhea will be checked with sucrase breath test and celiac disease panel.  For now she can continue taking Imodium as needed.  Finally, her GERD seems to be well-controlled with famotidine which she should continue for now.  -Will refer to [facility] for SIBO breath test -Perform sucrase breath test. -Check celiac disease panel -Continue with Imodium as needed -Continue with famotidine as needed for heartburn  All questions were answered.      Rhonda Blazing, MD Gastroenterology and Hepatology [facility]

## 2023-05-15 ENCOUNTER — Ambulatory Visit: Payer: Medicare Other | Admitting: Gastroenterology

## 2023-05-15 LAB — CELIAC DISEASE PANEL
(tTG) Ab, IgA: <1 U/mL
(tTG) Ab, IgG: <1 U/mL
Gliadin IgA: 2.2 U/mL
Gliadin IgG: <1 U/mL
Immunoglobulin A: 233 mg/dL (ref 70–320)

## 2023-05-18 ENCOUNTER — Encounter: Payer: Self-pay | Admitting: Cardiology

## 2023-05-18 ENCOUNTER — Ambulatory Visit: Payer: Medicare Other | Attending: Cardiology | Admitting: Cardiology

## 2023-05-18 VITALS — BP 114/64 | HR 64 | Ht 65.0 in | Wt 137.8 lb

## 2023-05-18 DIAGNOSIS — R002 Palpitations: Secondary | ICD-10-CM

## 2023-05-18 DIAGNOSIS — R030 Elevated blood-pressure reading, without diagnosis of hypertension: Secondary | ICD-10-CM | POA: Diagnosis not present

## 2023-05-18 NOTE — Patient Instructions (Signed)
Medication Instructions:  Continue all current medications.  Labwork: none  Testing/Procedures: none  Follow-Up: 6 months   Any Other Special Instructions Will Be Listed Below (If Applicable).  If you need a refill on your cardiac medications before your next appointment, please call your pharmacy.  

## 2023-05-18 NOTE — Progress Notes (Signed)
Clinical Summary Rhonda Andrews is a 82 y.o.female seen today for follow up of the following medical problems.   1. Palpitations/Dizziness - previous holter 05/2009 showed no significant arrhythmias.   - during prior admission did have short episode of PSVT - she was changed from propanolol to atenolol. With change had increase in symptoms and she changed herself back to propanolol - she reports short acting propanolol does not work as well, prefers long acting.    - diltiazem caused low bp's at home per her report, she stopped taking.     - tried midodine x 2 weeks, SBP 130s. Reports insomnia and she stopped taking. We had tried to see if could stabilize her bp to allow more stable use of her beta blocker   - previously she was on an unconvnetional way of taking propranolol long acting that she has done for many years, she splits the individual capsule into 2 separate doses. We have tried several other doses and meds which she did not tolerate and thus have accepted this atypical regimen.          - some dizziness after norvasc and headache, some LE edema. This was recently started for high bp.We stopped this medication last visit.     - in AM SBP 130s-140s, in afternoon 115-120s.      2. 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     Cholecalciferol (VITAMIN D-3) 125 MCG (5000 UT) TABS Take 1 tablet by mouth daily.     famotidine (PEPCID) 20 MG tablet Take 1 tablet (20 mg total) by mouth at bedtime. 90 tablet 3   fluticasone (FLONASE) 50 MCG/ACT nasal spray PLACE 2 SPRAYS IN BOTH NOSTRILS DAILY. 16 g 3   loperamide (IMODIUM A-D) 2 MG tablet Take 0.5 tablets (1 mg total) by mouth 3 (three) times daily as needed for diarrhea or loose stools. Can take before each meal if needed. (Patient taking differently: Take 1 mg by mouth daily at 6 (six) AM. Can take before each meal if needed.) 90 tablet 1   meclizine (ANTIVERT) 12.5 MG tablet Take 1 tablet (12.5 mg total) by mouth 3 (three) times daily as needed for dizziness. 30 tablet 2   OVER THE COUNTER MEDICATION Essential Enzymes 500 mg 1/2 daily prn     No current facility-administered medications for this visit.     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   Procedure: FECAL TRANSPLANT;  Surgeon: Iva Boop, MD;  Location: [facility];  Service: Endoscopy;;   KNEE ARTHROSCOPY Right    Precancerous  Tissue - Nose  February 14-2014     Allergies  Allergen Reactions   Hydrocodone Nausea And Vomiting   Prednisone Other (See Comments)    Heart races      Family History  Problem Relation Age of Onset   Lymphoma Mother    Colon cancer Mother    Hypertension Mother    Migraines Mother    Other Father        Wagner's Granulermatosis   Seizures Sister    Cancer Maternal Grandmother        brain   Cancer Maternal Grandfather        brain   Other Paternal Grandmother        blood clot - DVT following surgery   Alcohol abuse Paternal  Grandfather    Cirrhosis Paternal Grandfather    Thyroid disease Sister    Heart disease Sister      Social History Ms. Relihan reports that she has never smoked. She has never used smokeless tobacco. Ms. Laack reports no history of alcohol use.   Review of Systems CONSTITUTIONAL: No weight loss, fever, chills, weakness or fatigue.  HEENT: Eyes: No visual loss, blurred vision, double vision or yellow sclerae.No hearing loss, sneezing, congestion, runny nose or sore throat.  SKIN: No rash or itching.  CARDIOVASCULAR: per hpi RESPIRATORY: No shortness of breath, cough or sputum.  GASTROINTESTINAL: No anorexia, nausea, vomiting or diarrhea. No abdominal pain or blood.  GENITOURINARY: No burning on urination, no polyuria NEUROLOGICAL: No headache, dizziness, syncope, paralysis, ataxia, numbness or tingling in the extremities. No change in bowel or bladder control.  MUSCULOSKELETAL: No muscle, back pain, joint pain or stiffness.  LYMPHATICS: No enlarged nodes. No history of splenectomy.  PSYCHIATRIC: No history of depression or anxiety.  ENDOCRINOLOGIC: No reports of sweating, cold or heat intolerance. No polyuria or polydipsia.  Marland Kitchen   Physical Examination Today's Vitals   05/18/23 1437  BP: 114/64  Pulse: 64  SpO2: 97%  Weight: 137 lb 12.8 oz (62.5 kg)  Height: 5\' 5"  (1.651 m)   Body mass index is 22.93 kg/m.  Gen: resting comfortably, no acute distress HEENT: no scleral icterus, pupils equal round and reactive, no palptable cervical adenopathy,  CV: RRR, no mrg, no vjd Resp: Clear to auscultation bilaterally GI: abdomen is soft, non-tender, non-distended, normal bowel sounds, no hepatosplenomegaly MSK: extremities are warm, no edema.  Skin: warm, no rash Neuro:  no focal deficits Psych: appropriate affect   Diagnostic Studies     Assessment and Plan   1. Palpitations - long history, only medication she has tolerated is prn propanolol as outlined above -  can continue prn propranolol   2.Elevated blood pressure - occasioanl elevated bp, typically bp's are at goal. Recent trial of norvasc 2.5mg  daily led to dizziness, headaches, LE edema and we discontinued. Off this medication bp's remain fine. Essentially over time she has had some occasional high bp's, attempts at medical therapy have been limited by dizziness. Unless bp's are clearly consistently elevated would not reattempt bp medication   F/u 6 months         Antoine Poche, M.D.

## 2023-05-25 ENCOUNTER — Telehealth (INDEPENDENT_AMBULATORY_CARE_PROVIDER_SITE_OTHER): Payer: Self-pay | Admitting: Gastroenterology

## 2023-05-25 NOTE — Telephone Encounter (Signed)
Discussed with patient per Dr. Levon Hedger - the breath test for sucrose deficiency was normal. Patient verbalized understanding.

## 2023-05-25 NOTE — Telephone Encounter (Signed)
Hi [name],  Can you please call the patient and tell the patient the breath test for sucrose deficiency was normal?  Thanks,  Katrinka Blazing, MD Gastroenterology and Hepatology [facility]

## 2023-05-26 DIAGNOSIS — Z6821 Body mass index (BMI) 21.0-21.9, adult: Secondary | ICD-10-CM | POA: Diagnosis not present

## 2023-05-26 DIAGNOSIS — J302 Other seasonal allergic rhinitis: Secondary | ICD-10-CM | POA: Diagnosis not present

## 2023-05-26 DIAGNOSIS — R030 Elevated blood-pressure reading, without diagnosis of hypertension: Secondary | ICD-10-CM | POA: Diagnosis not present

## 2023-05-26 DIAGNOSIS — R002 Palpitations: Secondary | ICD-10-CM | POA: Diagnosis not present

## 2023-05-26 DIAGNOSIS — M810 Age-related osteoporosis without current pathological fracture: Secondary | ICD-10-CM | POA: Diagnosis not present

## 2023-05-26 DIAGNOSIS — E559 Vitamin D deficiency, unspecified: Secondary | ICD-10-CM | POA: Diagnosis not present

## 2023-05-26 DIAGNOSIS — R42 Dizziness and giddiness: Secondary | ICD-10-CM | POA: Diagnosis not present

## 2023-06-03 ENCOUNTER — Telehealth (INDEPENDENT_AMBULATORY_CARE_PROVIDER_SITE_OTHER): Payer: Self-pay | Admitting: *Deleted

## 2023-06-03 NOTE — Telephone Encounter (Signed)
It would make more sense to wait until the result is back, so we can determine if she needs only the Xifaxan or an additional antibiotic (if she has positive SIMO in breath test).

## 2023-06-03 NOTE — Telephone Encounter (Signed)
Patient states diarrhea is getting worse. She is ok if she dont eat. Losing weight. She has appt at [facility] oct 30th. She states she was prescribed xifiaxan 550mg  one tid for 14 days back in April and did not take because she was scared to get c diff again. She states she was told it would not cause c diff. She states med is still in date and she has full course. She wants to know if she could go ahead and take it.   [phone]

## 2023-06-04 NOTE — Telephone Encounter (Signed)
Discussed with patient per Dr. Levon Hedger-  It would make more sense to wait until the result is back, so we can determine if she needs only the Xifaxan or an additional antibiotic (if she has positive SIMO in breath test).      Note   Patient verbalized understanding.

## 2023-06-25 NOTE — Telephone Encounter (Signed)
error 

## 2023-07-01 DIAGNOSIS — R197 Diarrhea, unspecified: Secondary | ICD-10-CM | POA: Diagnosis not present

## 2023-07-01 DIAGNOSIS — R195 Other fecal abnormalities: Secondary | ICD-10-CM | POA: Diagnosis not present

## 2023-07-01 DIAGNOSIS — R140 Abdominal distension (gaseous): Secondary | ICD-10-CM | POA: Diagnosis not present

## 2023-07-12 ENCOUNTER — Telehealth (INDEPENDENT_AMBULATORY_CARE_PROVIDER_SITE_OTHER): Payer: Self-pay | Admitting: Gastroenterology

## 2023-07-12 NOTE — Telephone Encounter (Signed)
Hi [name],  Can you please call the patient and tell the patient the SIBO breath test was normal?  Thanks,  Katrinka Blazing, MD Gastroenterology and Hepatology [facility] Gastroenter[facility]

## 2023-07-13 ENCOUNTER — Encounter (INDEPENDENT_AMBULATORY_CARE_PROVIDER_SITE_OTHER): Payer: Self-pay

## 2023-07-13 NOTE — Telephone Encounter (Signed)
I spoke with the patient and made the patien aware the SIBO testing is normal.

## 2023-08-17 ENCOUNTER — Ambulatory Visit (INDEPENDENT_AMBULATORY_CARE_PROVIDER_SITE_OTHER): Payer: Medicare Other | Admitting: Gastroenterology

## 2023-08-19 ENCOUNTER — Encounter: Payer: Self-pay | Admitting: Gastroenterology

## 2023-09-16 ENCOUNTER — Telehealth: Payer: Self-pay | Admitting: Cardiology

## 2023-09-16 ENCOUNTER — Encounter: Payer: Self-pay | Admitting: Internal Medicine

## 2023-09-16 MED ORDER — PROPRANOLOL HCL ER 60 MG PO CP24: 60.0000 mg | ORAL_CAPSULE | Freq: Every day | ORAL | 3 refills | Status: DC

## 2023-09-16 NOTE — Telephone Encounter (Signed)
 Error

## 2023-09-16 NOTE — Telephone Encounter (Signed)
 Per Dr. Amanda Jungling: can refill the propranolol  60mg  extrended release once daily for her.  Refill sent

## 2023-09-16 NOTE — Telephone Encounter (Signed)
*  STAT* If patient is at the pharmacy, call can be transferred to refill team.   1. Which medications need to be refilled? (please list name of each medication and dose if known)   propranolol  ER (INDERAL  LA) 60 MG 24 hr capsule [[id number]]   2. Would you like to learn more about the convenience, safety, & potential cost savings by using the [facility]?   3. Are you open to using the [facility] (Type [facility]. ).  4. Which pharmacy/location (including street and city if local pharmacy) is medication to be sent to?  [facility] - [place], [place] - [address]   5. Do they need a 30 day or 90 day supply?   30 day  Patient stated she had 1 capsule left.  Patient noted she wants the extended release version of this medication.

## 2023-09-30 DIAGNOSIS — L570 Actinic keratosis: Secondary | ICD-10-CM | POA: Diagnosis not present

## 2023-09-30 DIAGNOSIS — L565 Disseminated superficial actinic porokeratosis (DSAP): Secondary | ICD-10-CM | POA: Diagnosis not present

## 2023-10-13 DIAGNOSIS — H43393 Other vitreous opacities, bilateral: Secondary | ICD-10-CM | POA: Diagnosis not present

## 2023-11-02 ENCOUNTER — Encounter (INDEPENDENT_AMBULATORY_CARE_PROVIDER_SITE_OTHER): Payer: Self-pay | Admitting: Gastroenterology

## 2023-11-02 ENCOUNTER — Ambulatory Visit (INDEPENDENT_AMBULATORY_CARE_PROVIDER_SITE_OTHER): Payer: Medicare Other | Admitting: Gastroenterology

## 2023-11-02 VITALS — BP 139/84 | HR 81 | Temp 97.2°F | Ht 65.0 in | Wt 135.6 lb

## 2023-11-02 DIAGNOSIS — K580 Irritable bowel syndrome with diarrhea: Secondary | ICD-10-CM

## 2023-11-02 DIAGNOSIS — K9089 Other intestinal malabsorption: Secondary | ICD-10-CM | POA: Insufficient documentation

## 2023-11-02 DIAGNOSIS — Z8619 Personal history of other infectious and parasitic diseases: Secondary | ICD-10-CM

## 2023-11-02 MED ORDER — COLESEVELAM HCL 3.75 G PO PACK
1.0000 | PACK | Freq: Every day | ORAL | 3 refills | Status: DC
Start: 2023-11-02 — End: 2024-06-14

## 2023-11-02 NOTE — Progress Notes (Signed)
 Rhonda Andrews, M.D. Gastroenterology & Hepatology [facility] Gastroenterology [address] [place], [place] [address]  Primary Care Physician: Rhonda Potts, MD [address] [place] [address]  I will communicate my assessment and recommendations to the referring MD via EMR.  Problems: Chronic diarrhea, likely due to IBS-D Lactose intolerance. History of C. difficile colitis requiring fecal transplant   History of Present Illness: Rhonda Andrews is a 83 y.o. female with a history of recurrent C.diff s/p fecal transplant, GERD, IBS-D, anxiety, HTN, diabetes, and colitis presents to the clinic for follow up of chronic diarrhea.  The patient was last seen on 05/14/2023. At that time, the patient was referred to [facility] for SIBO breath test, which came back normal.  Had sucrose deficiency testing which was negative.  Celiac disease panel was checked and was negative.  Patient reports that she is still having diarrhea. States that her diarrhea fluctuates , as some weeks she may be asymptomatic and some weeks she may have 3-4 bowel movements per day. She reports that this may be related to the type of food she eats. She is mostly taking Imodium once a day.  She may have bloating episodes intermittently when she eats greasy or spicy foods. She may have some cramping when eating salad.  The patient denies having any nausea, vomiting, fever, chills, hematochezia, melena, hematemesis, abdominal pain, jaundice, pruritus . Weight may fluctuate if she has diarrhea.  Patient had cholecystectomy many years ago and reports that her diarrhea worsened after this.  Notably, she wants to avoid taking her Xifaxan unless it is strictly necessary  Last Colonoscopy: Last Colonoscopy: 2019 The perianal and digital rectal examinations were normal. A few diverticula were found in the sigmoid colon. The terminal ileum appeared normal. The exam was otherwise  without abnormality on direct and retroflexion views. Fecal Microbiota Transplant (Bacteriotherapy): Donor stool was prepared by a third party (purchased) as per protocol. Approximately 450 mL of the emulsified donor stool was instilled in the terminal ileum. A detailed colonoscopic detailed colonoscopic exam could not be performed upon scope withdrawal secondary to limited visibility from the instilled stool.  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)    Hypertension    IBS (irritable bowel syndrome)    Lactose intolerance    Palpitation    Palpitations    Vitamin D deficiency disease 06/23/2019    Past Surgical History: 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   Procedure: FECAL TRANSPLANT;  Surgeon: Iva Boop, MD;  Location: [facility];  Service: Endoscopy;;   KNEE ARTHROSCOPY Right    Precancerous  Tissue - Nose  February 14-2014    Family History: Family History  Problem Relation Age of Onset   Lymphoma Mother    Colon cancer Mother    Hypertension Mother    Migraines Mother    Other Father        Rhonda Andrews  Granulermatosis   Seizures Sister    Cancer Maternal Grandmother        brain   Cancer Maternal Grandfather        brain   Other Paternal Grandmother        blood clot - DVT following surgery   Alcohol abuse Paternal Grandfather    Cirrhosis Paternal Grandfather    Thyroid disease Sister    Heart disease Sister     Social  History: Social History   Tobacco Use  Smoking Status Never  Smokeless Tobacco Never   Social History   Substance and Sexual Activity  Alcohol Use No   Alcohol/week: 0.0 standard drinks of alcohol   Social History   Substance and Sexual Activity  Drug Use No    Allergies: Allergies  Allergen Reactions   Hydrocodone Nausea And Vomiting   Prednisone Other (See Comments)    Heart races    Medications: Current Outpatient Medications  Medication Sig Dispense Refill   amLODipine (NORVASC) 2.5 MG tablet Take 2.5 mg by mouth daily as needed.     Calcium Carb-Cholecalciferol (CALCIUM + VITAMIN D3 PO) Take 1 tablet by mouth 2 (two) times daily.     Cholecalciferol (VITAMIN D-3) 125 MCG (5000 UT) TABS Take 1 tablet by mouth daily.     famotidine (PEPCID) 20 MG tablet Take 1 tablet (20 mg total) by mouth at bedtime. 90 tablet 3   fluticasone (FLONASE) 50 MCG/ACT nasal spray PLACE 2 SPRAYS IN BOTH NOSTRILS DAILY. 16 g 3   loperamide (IMODIUM A-D) 2 MG tablet Take 0.5 tablets (1 mg total) by mouth 3 (three) times daily as needed for diarrhea or loose stools. Can take before each meal if needed. (Patient taking differently: Take 1 mg by mouth daily at 6 (six) AM. Can take before each meal if needed.) 90 tablet 1   meclizine (ANTIVERT) 12.5 MG tablet Take 1 tablet (12.5 mg total) by mouth 3 (three) times daily as needed for dizziness. 30 tablet 2   OVER THE COUNTER MEDICATION Essential Enzymes 500 mg 1/2 daily prn     propranolol ER (INDERAL LA) 60 MG 24 hr capsule Take 1 capsule (60 mg total) by mouth daily. 30 capsule 3   No current facility-administered medications for this visit.    Review of Systems: GENERAL: negative for malaise, night sweats HEENT: No changes in hearing or vision, no nose bleeds or other nasal problems. NECK: Negative for lumps, goiter, pain and significant neck swelling RESPIRATORY: Negative for cough, wheezing CARDIOVASCULAR: Negative for chest pain, leg  swelling, palpitations, orthopnea GI: SEE HPI MUSCULOSKELETAL: Negative for joint pain or swelling, back pain, and muscle pain. SKIN: Negative for lesions, rash PSYCH: Negative for sleep disturbance, mood disorder and recent psychosocial stressors. HEMATOLOGY Negative for prolonged bleeding, bruising easily, and swollen nodes. ENDOCRINE: Negative for cold or heat intolerance, polyuria, polydipsia and goiter. NEURO: negative for tremor, gait imbalance, syncope and seizures. The remainder of the review of systems is noncontributory.   Physical Exam: BP 139/84 (BP Location: Left Arm, Patient Position: Sitting, Cuff Size: Normal)   Pulse 81   Temp (!) 97.2 F (36.2 C) (Temporal)   Ht 5\' 5"  (1.651 m)   Wt 135 lb 9.6 oz (61.5 kg)   BMI 22.57 kg/m  GENERAL: The patient is AO x3, in no acute distress. HEENT: Head is normocephalic and atraumatic. EOMI are intact. Mouth is well hydrated and without lesions. NECK: Supple. No masses LUNGS: Clear to auscultation. No presence of  rhonchi/wheezing/rales. Adequate chest expansion HEART: RRR, normal s1 and s2. ABDOMEN: Soft, nontender, no guarding, no peritoneal signs, and nondistended. BS +. No masses. EXTREMITIES: Without any cyanosis, clubbing, rash, lesions or edema. NEUROLOGIC: AOx3, no focal motor deficit. SKIN: no jaundice, no rashes  Imaging/Labs: as above  I personally reviewed and interpreted the available labs, imaging and endoscopic files.  Impression and Plan: KIRK SAMPLEY is a 83 y.o. female with a history of recurrent C.diff s/p fecal transplant, GERD, IBS-D, anxiety, HTN, diabetes, and colitis presents to the clinic for follow up of chronic diarrhea.  Patient has presented longstanding history of chronic diarrhea without red flag signs.  This actually aggravated after she underwent cholecystectomy (not a candidate for Viberzi).  Has had multiple investigations in the past with most recent SIBO breath test negative.  We  discussed that part of the first-line treatments for IBS-D is the use of Xifaxan course but she is reluctant to take this unless strictly necessary given her history of C. difficile.  Due to this, we will attempt a course of Colesevelam.  -Start Colesevelam daily packs -If persistent or worsening diarrhea, please take prescribed Xifaxan  All questions were answered.      Rhonda Blazing, MD Gastroenterology and Hepatology [facility] Gastroenterology

## 2023-11-02 NOTE — Patient Instructions (Addendum)
 Start Colesevelam daily packs If persistent or worsening diarrhea, please take prescribed Xifaxan

## 2023-11-03 DIAGNOSIS — H25811 Combined forms of age-related cataract, right eye: Secondary | ICD-10-CM | POA: Diagnosis not present

## 2023-11-03 DIAGNOSIS — H2512 Age-related nuclear cataract, left eye: Secondary | ICD-10-CM | POA: Diagnosis not present

## 2023-11-10 ENCOUNTER — Telehealth: Payer: Self-pay | Admitting: Cardiology

## 2023-11-10 MED ORDER — PROPRANOLOL HCL ER 60 MG PO CP24: 60.0000 mg | ORAL_CAPSULE | Freq: Every day | ORAL | 3 refills | Status: DC

## 2023-11-10 NOTE — Telephone Encounter (Signed)
 Refill sent.

## 2023-11-10 NOTE — Telephone Encounter (Signed)
*  STAT* If patient is at the pharmacy, call can be transferred to refill team.   1. Which medications need to be refilled? (please list name of each medication and dose if known) propranolol ER (INDERAL LA)   2. Would you like to learn more about the convenience, safety, & potential cost savings by using the [facility]?no   3. Are you open to using the [facility] (Type [facility]. ). no   4. Which pharmacy/location (including street and city if local pharmacy) is medication to be sent to?[facility] - [place],  - [address]   5. Do they need a 30 day or 90 day supply? 30 day

## 2023-11-11 ENCOUNTER — Telehealth: Payer: Self-pay | Admitting: Cardiology

## 2023-11-11 NOTE — Telephone Encounter (Signed)
 Spoke with patient who is requesting to have her propranolol switched back to plain propranolol. Reports propranolol LA 60 mg doesn't control her blood pressure well. Patient says her blood pressure is high after she eat pork and she is planning to have cataract surgery next Wednesday and wants her blood pressure well controlled before that procedure. Reports she feels that plain propranolol 60 mg manages her blood pressure better. Reports using the propranolol 60 mg daily as needed for the past year and admits that she was unable to tolerate propranolol la 60 mg so she restarted propranolol 60 mg on her own. Reports she has been discussing this with Dr. Wyline Mood for the past 10 years.  Says she checks her blood pressures at home daily but doesn't have the readings documented.  Advised that message would be send to provider and she would be contacted with his response. Aware that she has an appointment on 11/25/2023 for her 6 month follow up.

## 2023-11-11 NOTE — Telephone Encounter (Signed)
 Pt c/o medication issue:  1. Name of Medication: propranolol ER (INDERAL LA) 60 MG 24 hr capsule   2. How are you currently taking this medication (dosage and times per day)? propranolol ER (INDERAL LA) 60 MG 24 hr capsule   3. Are you having a reaction (difficulty breathing--STAT)? No  4. What is your medication issue? ER was sent in and pt wants the regular

## 2023-11-11 NOTE — Telephone Encounter (Signed)
 Contacted [employer], spoke with Mellody Dance who verified that patient has been getting propranolol LA 60 mg since April 2024 and the last time she filled propranolol 60 mg BID was 04/06/2022.

## 2023-11-12 ENCOUNTER — Telehealth: Payer: Self-pay | Admitting: Cardiology

## 2023-11-12 ENCOUNTER — Other Ambulatory Visit: Payer: Self-pay | Admitting: Cardiology

## 2023-11-12 NOTE — Telephone Encounter (Signed)
 1. Which medications need to be refilled? (please list name of each medication and dose if known) Propranolol 60 mg  2. Which pharmacy/location (including street and city if local pharmacy) is medication to be sent to? [facility]  3. Do they need a 30 day or 90 day supply? 30     PT came into [place] office requesting a refill on this medication. She has spoken to a couple people in regards to getting it refilled. She said that Dr.Branch has denied her for the medication and sent in another one but she does not want that one. She has been taking Propranolol 60mg  BID since 1970's or later. The bottle she handed me said August of 2023.

## 2023-11-12 NOTE — Telephone Encounter (Signed)
 Message fwd to provider to give okay.

## 2023-11-13 MED ORDER — PROPRANOLOL HCL 60 MG PO TABS: 60.0000 mg | ORAL_TABLET | Freq: Two times a day (BID) | ORAL | 0 refills | Status: DC

## 2023-11-13 NOTE — Telephone Encounter (Signed)
 Rx sent of Propranolol 60 Mg BID per JB  Long acting discontinued  Sent in 30 days for patient to have until appointment 3/26 Patient informed and verbalized understanding of plan.

## 2023-11-13 NOTE — Telephone Encounter (Signed)
 Request for medication completed in other phone note. See phone note for more details.

## 2023-11-13 NOTE — Telephone Encounter (Signed)
 Advised patient a message has been sent to the provider for his approval. And we will take care of it as soon as he lets [place] know what he would like to do.

## 2023-11-13 NOTE — Telephone Encounter (Signed)
 Pt is calling back checking status of the medication. She is really upset and wants a call back.

## 2023-11-23 ENCOUNTER — Encounter: Payer: Self-pay | Admitting: *Deleted

## 2023-11-25 ENCOUNTER — Ambulatory Visit: Payer: Medicare Other | Attending: Cardiology | Admitting: Cardiology

## 2023-11-25 ENCOUNTER — Encounter: Payer: Self-pay | Admitting: Cardiology

## 2023-11-25 VITALS — BP 122/70 | HR 79 | Ht 65.0 in | Wt 136.4 lb

## 2023-11-25 DIAGNOSIS — R002 Palpitations: Secondary | ICD-10-CM

## 2023-11-25 NOTE — Progress Notes (Signed)
 Clinical Summary Ms. Ozer is a 83 y.o.female seen today for follow up of the following medical problems.   1. Palpitations/Dizziness - previous holter 05/2009 showed no significant arrhythmias.   - during prior admission did have short episode of PSVT - she was changed from propanolol to atenolol. With change had increase in symptoms and she changed herself back to propanolol - she reports short acting propanolol does not work as well, prefers long acting.    - diltiazem caused low bp's at home per her report, she stopped taking.     - tried midodine x 2 weeks, SBP 130s. Reports insomnia and she stopped taking. We had tried to see if could stabilize her bp to allow more stable use of her beta blocker   - previously she was on an unconvnetional way of taking propranolol long acting that she has done for many years, she splits the individual capsule into 2 separate doses. We have tried several other doses and meds which she did not tolerate and thus have accepted this atypical regimen.          - some dizziness after norvasc and headache, some LE edema. This was recently started for high bp.We stopped this medication last visit.     - in AM SBP 130s-140s, in afternoon 115-120s.  - has the propranolol at home, takes a 1/4th of the pill at night. Additional 1/4 prn.  - denies significant palpitatons - home bp's 110-120s/50s-60s     2. 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     Calcium Carb-Cholecalciferol (CALCIUM + VITAMIN D3 PO) Take 1 tablet by mouth 2 (two) times daily.     Cholecalciferol (VITAMIN D-3) 125 MCG (5000 UT) TABS Take 1 tablet by mouth daily.     Colesevelam HCl 3.75 g PACK Take 1 packet (3.75 g total) by mouth daily. Take 4 hours apart from other medications 90 each 3   famotidine (PEPCID) 20 MG tablet Take 1 tablet (20 mg total) by mouth at bedtime. 90 tablet 3   fluticasone (FLONASE) 50 MCG/ACT nasal spray PLACE 2 SPRAYS IN BOTH NOSTRILS DAILY. 16 g 3   loperamide (IMODIUM A-D) 2 MG tablet Take 0.5 tablets (1 mg total) by mouth 3 (three) times daily as needed for diarrhea or loose stools. Can take before each meal if needed. (Patient taking differently: Take 1 mg by mouth daily at 6 (six) AM. Can take before each meal if needed.) 90 tablet 1   meclizine (ANTIVERT) 12.5 MG tablet Take 1 tablet (12.5 mg total) by mouth 3 (three) times daily as needed for dizziness. 30 tablet 2   OVER THE COUNTER MEDICATION Essential Enzymes 500 mg 1/2 daily prn     propranolol (INDERAL) 60 MG tablet  Take 1 tablet (60 mg total) by mouth 2 (two) times daily. 60 tablet 0   No current facility-administered medications for this visit.     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   Procedure: FECAL TRANSPLANT;  Surgeon: Iva Boop, MD;  Location: [facility];  Service: Endoscopy;;   KNEE ARTHROSCOPY Right    Precancerous  Tissue - Nose  February 14-2014     Allergies  Allergen Reactions   Hydrocodone  Nausea And Vomiting   Prednisone Other (See Comments)    Heart races      Family History  Problem Relation Age of Onset   Lymphoma Mother    Colon cancer Mother    Hypertension Mother    Migraines Mother    Other Father        Wagner's Granulermatosis   Seizures Sister    Cancer Maternal Grandmother        brain   Cancer Maternal Grandfather        brain   Other Paternal Grandmother        blood clot - DVT following surgery   Alcohol abuse Paternal Grandfather    Cirrhosis Paternal Grandfather    Thyroid disease Sister    Heart disease Sister      Social History Ms. Nathanson reports that she has never smoked. She has never used smokeless tobacco. Ms. Longhi reports no history of alcohol use.     Physical Examination Today's Vitals   11/25/23 1522  BP: 122/70  Pulse: 79  SpO2: 100%  Weight: 136 lb 6.4 oz (61.9 kg)  Height: 5\' 5"  (1.651 m)   Body mass index is 22.7 kg/m.  Gen: resting comfortably, no acute distress HEENT: no scleral icterus, pupils equal round and reactive, no palptable cervical adenopathy,  CV: RRR, no m/rg, no jvd Resp: Clear to auscultation bilaterally GI: abdomen is soft, non-tender, non-distended, normal bowel sounds, no hepatosplenomegaly MSK: extremities are warm, no edema.  Skin: warm, no rash Neuro:  no focal deficits Psych: appropriate affect   Diagnostic Studies     Assessment and Plan  1. Palpitations - long history, only medication she has tolerated is prn propanolol. Atypical in she will cut the pill in quarters. We have tried several other regimens and has not tolerated so we have accepted this atypical regimen.  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 Off this medication bp's remain fine.   - Unless bp's are clearly consistently elevated would not reattempt bp medication      Antoine Poche, M.D

## 2023-11-25 NOTE — Patient Instructions (Signed)
 Medication Instructions:  Continue all current medications.   Labwork: none  Testing/Procedures: none  Follow-Up: 6 months   Any Other Special Instructions Will Be Listed Below (If Applicable).   If you need a refill on your cardiac medications before your next appointment, please call your pharmacy.

## 2023-11-27 ENCOUNTER — Encounter: Payer: Self-pay | Admitting: *Deleted

## 2023-11-30 ENCOUNTER — Encounter: Payer: Self-pay | Admitting: Cardiology

## 2023-12-01 ENCOUNTER — Encounter: Payer: Self-pay | Admitting: Internal Medicine

## 2023-12-04 ENCOUNTER — Ambulatory Visit: Admission: EM | Admit: 2023-12-04 | Discharge: 2023-12-04 | Disposition: A | Discharge status: Home / Self Care

## 2023-12-04 NOTE — ED Notes (Addendum)
 After speaking with provider we do not perform pre-op testing. I informed pt that she should call her surgery center to find out what is expected of her before surgery.

## 2023-12-10 DIAGNOSIS — R42 Dizziness and giddiness: Secondary | ICD-10-CM | POA: Diagnosis not present

## 2023-12-10 DIAGNOSIS — Z13220 Encounter for screening for lipoid disorders: Secondary | ICD-10-CM | POA: Diagnosis not present

## 2023-12-10 DIAGNOSIS — Z131 Encounter for screening for diabetes mellitus: Secondary | ICD-10-CM | POA: Diagnosis not present

## 2023-12-10 DIAGNOSIS — Z1329 Encounter for screening for other suspected endocrine disorder: Secondary | ICD-10-CM | POA: Diagnosis not present

## 2023-12-10 DIAGNOSIS — E559 Vitamin D deficiency, unspecified: Secondary | ICD-10-CM | POA: Diagnosis not present

## 2023-12-14 ENCOUNTER — Encounter: Payer: Self-pay | Admitting: Internal Medicine

## 2023-12-16 DIAGNOSIS — I10 Essential (primary) hypertension: Secondary | ICD-10-CM | POA: Diagnosis not present

## 2023-12-16 DIAGNOSIS — H25812 Combined forms of age-related cataract, left eye: Secondary | ICD-10-CM | POA: Diagnosis not present

## 2023-12-16 DIAGNOSIS — H2512 Age-related nuclear cataract, left eye: Secondary | ICD-10-CM | POA: Diagnosis not present

## 2023-12-18 ENCOUNTER — Emergency Department (HOSPITAL_COMMUNITY)
Admission: EM | Admit: 2023-12-18 | Discharge: 2023-12-18 | Disposition: A | Discharge status: Home / Self Care | Attending: Emergency Medicine | Admitting: Emergency Medicine

## 2023-12-18 ENCOUNTER — Encounter (HOSPITAL_COMMUNITY): Payer: Self-pay

## 2023-12-18 ENCOUNTER — Other Ambulatory Visit: Payer: Self-pay

## 2023-12-18 ENCOUNTER — Emergency Department (HOSPITAL_COMMUNITY)

## 2023-12-18 DIAGNOSIS — I10 Essential (primary) hypertension: Secondary | ICD-10-CM | POA: Insufficient documentation

## 2023-12-18 DIAGNOSIS — I160 Hypertensive urgency: Secondary | ICD-10-CM | POA: Diagnosis not present

## 2023-12-18 DIAGNOSIS — Z79899 Other long term (current) drug therapy: Secondary | ICD-10-CM | POA: Insufficient documentation

## 2023-12-18 DIAGNOSIS — G4489 Other headache syndrome: Secondary | ICD-10-CM | POA: Diagnosis not present

## 2023-12-18 DIAGNOSIS — R21 Rash and other nonspecific skin eruption: Secondary | ICD-10-CM | POA: Diagnosis not present

## 2023-12-18 DIAGNOSIS — I6782 Cerebral ischemia: Secondary | ICD-10-CM | POA: Diagnosis not present

## 2023-12-18 DIAGNOSIS — R519 Headache, unspecified: Secondary | ICD-10-CM | POA: Diagnosis not present

## 2023-12-18 LAB — CBC WITH DIFFERENTIAL/PLATELET
Abs Immature Granulocytes: 0.02 10*3/uL (ref 0.00–0.07)
Basophils Absolute: 0.1 10*3/uL (ref 0.0–0.1)
Basophils Relative: 1 %
Eosinophils Absolute: 0 10*3/uL (ref 0.0–0.5)
Eosinophils Relative: 1 %
HCT: 48.4 % — ABNORMAL HIGH (ref 36.0–46.0)
Hemoglobin: 15.6 g/dL — ABNORMAL HIGH (ref 12.0–15.0)
Immature Granulocytes: 0 %
Lymphocytes Relative: 36 %
Lymphs Abs: 2.6 10*3/uL (ref 0.7–4.0)
MCH: 29.8 pg (ref 26.0–34.0)
MCHC: 32.2 g/dL (ref 30.0–36.0)
MCV: 92.4 fL (ref 80.0–100.0)
Monocytes Absolute: 0.7 10*3/uL (ref 0.1–1.0)
Monocytes Relative: 9 %
Neutro Abs: 3.9 10*3/uL (ref 1.7–7.7)
Neutrophils Relative %: 53 %
Platelets: 251 10*3/uL (ref 150–400)
RBC: 5.24 MIL/uL — ABNORMAL HIGH (ref 3.87–5.11)
RDW: 13.1 % (ref 11.5–15.5)
WBC: 7.2 10*3/uL (ref 4.0–10.5)
nRBC: 0 % (ref 0.0–0.2)

## 2023-12-18 LAB — BASIC METABOLIC PANEL WITH GFR
Anion gap: 6 (ref 5–15)
BUN: 21 mg/dL (ref 8–23)
CO2: 26 mmol/L (ref 22–32)
Calcium: 8.9 mg/dL (ref 8.9–10.3)
Chloride: 105 mmol/L (ref 98–111)
Creatinine, Ser: 0.7 mg/dL (ref 0.44–1.00)
GFR, Estimated: >60 mL/min (ref >60)
Glucose, Bld: 117 mg/dL — ABNORMAL HIGH (ref 70–99)
Potassium: 3.8 mmol/L (ref 3.5–5.1)
Sodium: 137 mmol/L (ref 135–145)

## 2023-12-18 MED ORDER — ACETAMINOPHEN 325 MG PO TABS
650.0000 mg | ORAL_TABLET | Freq: Once | ORAL | Status: DC
  Filled 2023-12-18: qty 2

## 2023-12-18 MED ORDER — CLONIDINE HCL 0.1 MG PO TABS: 0.2000 mg | ORAL_TABLET | Freq: Two times a day (BID) | ORAL | 0 refills | Status: DC | PRN

## 2023-12-18 MED ORDER — CLONIDINE HCL 0.1 MG PO TABS
0.1000 mg | ORAL_TABLET | Freq: Once | ORAL | Status: AC
  Administered 2023-12-18: 0.1 mg via ORAL
  Filled 2023-12-18: qty 1

## 2023-12-18 NOTE — ED Triage Notes (Signed)
 Pt c/o hypertension today. Takes BP meds as needed and last dose was today 630 pm. Pt endorses headache.

## 2023-12-18 NOTE — ED Provider Notes (Signed)
 [place] EMERGENCY DEPARTMENT AT [facility] Provider Note   CSN: [id number] Arrival date & time: 12/18/23  0100     History  No chief complaint on file.   Rhonda Andrews is a 83 y.o. female.  Patient is an 83 year old female presenting with history of GERD, IBS, anxiety, hypertension.  Patient presenting with complaints of elevated blood pressure.  She routinely checks her blood pressure at home and it has been running high today.  On 2 occasions she has gotten systolic readings over 200.  She describes headache, but denies any weakness or numbness.  She does report cataract surgery 2 days ago, but no visual disturbances.  She has propranolol  at home that she takes as needed for elevated blood pressure.  She took that this evening with little relief.       Home Medications Prior to Admission medications   Medication Sig Start Date End Date Taking? Authorizing Provider  Calcium  Carb-Cholecalciferol (CALCIUM  + VITAMIN D3 PO) Take 1 tablet by mouth 2 (two) times daily.    [provider]  Cholecalciferol (VITAMIN D -3) 125 MCG (5000 UT) TABS Take 1 tablet by mouth daily.    [provider]  Colesevelam  HCl 3.75 g PACK Take 1 packet (3.75 g total) by mouth daily. Take 4 hours apart from other medications 11/02/23   Umberto Ganong, Bearl Limes, MD  famotidine  (PEPCID ) 20 MG tablet Take 1 tablet (20 mg total) by mouth at bedtime. 08/14/22   Castaneda Mayorga, Daniel, MD  fluticasone  (FLONASE ) 50 MCG/ACT nasal spray PLACE 2 SPRAYS IN BOTH NOSTRILS DAILY. 11/21/20   Gosrani, Nimish C, MD  loperamide  (IMODIUM  A-D) 2 MG tablet Take 0.5 tablets (1 mg total) by mouth 3 (three) times daily as needed for diarrhea or loose stools. Can take before each meal if needed. Patient taking differently: Take 1 mg by mouth daily at 6 (six) AM. Can take before each meal if needed. 03/18/23   Eustacio Highman, NP  meclizine  (ANTIVERT ) 12.5 MG tablet Take 1 tablet (12.5 mg total) by  mouth 3 (three) times daily as needed for dizziness. 04/17/20   Zorita Hiss, NP  OVER THE COUNTER MEDICATION Essential Enzymes 500 mg 1/2 daily prn    [provider]  propranolol  (INDERAL ) 60 MG tablet Take 1 tablet (60 mg total) by mouth 2 (two) times daily. 11/13/23   Laurann Pollock, MD      Allergies    Hydrocodone and Prednisone    Review of Systems   Review of Systems  All other systems reviewed and are negative.   Physical Exam Updated Vital Signs BP (!) 172/89   Pulse 75   Temp 98.3 F (36.8 C)   Resp 18   SpO2 97%  Physical Exam Vitals and nursing note reviewed.  Constitutional:      General: She is not in acute distress.    Appearance: She is well-developed. She is not diaphoretic.  HENT:     Head: Normocephalic and atraumatic.  Eyes:     Extraocular Movements: Extraocular movements intact.     Pupils: Pupils are equal, round, and reactive to light.  Cardiovascular:     Rate and Rhythm: Normal rate and regular rhythm.     Heart sounds: No murmur heard.    No friction rub. No gallop.  Pulmonary:     Effort: Pulmonary effort is normal. No respiratory distress.     Breath sounds: Normal breath sounds. No wheezing.  Abdominal:  General: Bowel sounds are normal. There is no distension.     Palpations: Abdomen is soft.     Tenderness: There is no abdominal tenderness.  Musculoskeletal:        General: Normal range of motion.     Cervical back: Normal range of motion and neck supple.  Skin:    General: Skin is warm and dry.  Neurological:     General: No focal deficit present.     Mental Status: She is alert and oriented to person, place, and time.     Cranial Nerves: No cranial nerve deficit.     Motor: No weakness.     Coordination: Coordination normal.     ED Results / Procedures / Treatments   Labs (all labs ordered are listed, but only abnormal results are displayed) Labs Reviewed  BASIC METABOLIC PANEL WITH GFR  CBC WITH  DIFFERENTIAL/PLATELET    EKG None  Radiology No results found.  Procedures Procedures    Medications Ordered in ED Medications  cloNIDine  (CATAPRES ) tablet 0.1 mg (has no administration in time range)    ED Course/ Medical Decision Making/ A&P  Patient is an 83 year old female presenting with elevated blood pressure as described in the HPI.  Patient arrives here with blood pressure of 189/94, but otherwise stable vital signs.  Physical examination is unremarkable patient is neurologically intact.  Laboratory studies obtained including CBC and basic metabolic panel, both of which are unremarkable.  CT scan of the head obtained showing no acute intracranial abnormality.  Patient given clonidine  and blood pressure is now 142/61 at the time of my reassessment.  I feel as though she can safely be discharged.  I will prescribe a small quantity of clonidine  which she can take as needed if her blood pressures remain elevated.  I would advise her to keep a record of her blood pressure at home and follow this up with her primary doctor.  Nothing tonight appears acute and there is no evidence for endorgan damage.  Final Clinical Impression(s) / ED Diagnoses Final diagnoses:  None    Rx / DC Orders ED Discharge Orders     None         Orvilla Blander, MD 12/18/23 [phone]

## 2023-12-18 NOTE — Discharge Instructions (Signed)
 Begin taking clonidine  as prescribed as needed for sustained blood pressures greater than 180 systolic.  Keep a record of your blood pressures at home and follow-up with your primary doctor in the next week to go over the results.

## 2023-12-24 DIAGNOSIS — E559 Vitamin D deficiency, unspecified: Secondary | ICD-10-CM | POA: Diagnosis not present

## 2023-12-24 DIAGNOSIS — Z0000 Encounter for general adult medical examination without abnormal findings: Secondary | ICD-10-CM | POA: Diagnosis not present

## 2023-12-24 DIAGNOSIS — R42 Dizziness and giddiness: Secondary | ICD-10-CM | POA: Diagnosis not present

## 2023-12-24 DIAGNOSIS — Z0001 Encounter for general adult medical examination with abnormal findings: Secondary | ICD-10-CM | POA: Diagnosis not present

## 2023-12-24 DIAGNOSIS — Z6821 Body mass index (BMI) 21.0-21.9, adult: Secondary | ICD-10-CM | POA: Diagnosis not present

## 2023-12-24 DIAGNOSIS — R002 Palpitations: Secondary | ICD-10-CM | POA: Diagnosis not present

## 2023-12-24 DIAGNOSIS — M810 Age-related osteoporosis without current pathological fracture: Secondary | ICD-10-CM | POA: Diagnosis not present

## 2023-12-24 DIAGNOSIS — Z1389 Encounter for screening for other disorder: Secondary | ICD-10-CM | POA: Diagnosis not present

## 2023-12-30 DIAGNOSIS — H2511 Age-related nuclear cataract, right eye: Secondary | ICD-10-CM | POA: Diagnosis not present

## 2023-12-30 DIAGNOSIS — I10 Essential (primary) hypertension: Secondary | ICD-10-CM | POA: Diagnosis not present

## 2023-12-30 DIAGNOSIS — H25811 Combined forms of age-related cataract, right eye: Secondary | ICD-10-CM | POA: Diagnosis not present

## 2024-01-12 DIAGNOSIS — L821 Other seborrheic keratosis: Secondary | ICD-10-CM | POA: Diagnosis not present

## 2024-04-12 ENCOUNTER — Encounter (INDEPENDENT_AMBULATORY_CARE_PROVIDER_SITE_OTHER): Payer: Self-pay | Admitting: Gastroenterology

## 2024-05-09 DIAGNOSIS — Z1231 Encounter for screening mammogram for malignant neoplasm of breast: Secondary | ICD-10-CM | POA: Diagnosis not present

## 2024-05-17 ENCOUNTER — Other Ambulatory Visit: Payer: Self-pay | Admitting: Internal Medicine

## 2024-05-17 DIAGNOSIS — R928 Other abnormal and inconclusive findings on diagnostic imaging of breast: Secondary | ICD-10-CM

## 2024-05-23 ENCOUNTER — Ambulatory Visit (INDEPENDENT_AMBULATORY_CARE_PROVIDER_SITE_OTHER): Admitting: Gastroenterology

## 2024-05-23 ENCOUNTER — Encounter (INDEPENDENT_AMBULATORY_CARE_PROVIDER_SITE_OTHER): Payer: Self-pay | Admitting: Gastroenterology

## 2024-05-23 VITALS — BP 130/63 | HR 74 | Temp 98.4°F | Ht 65.0 in | Wt 136.3 lb

## 2024-05-23 DIAGNOSIS — K580 Irritable bowel syndrome with diarrhea: Secondary | ICD-10-CM | POA: Diagnosis not present

## 2024-05-23 NOTE — Patient Instructions (Signed)
 Continue with Imodium  half to 1 pill every day as needed for diarrhea Please let us  know if presenting worsening diarrhea in the future, abdominal pain, rectal bleeding or black stools Can try using Plant Enzymes if you want to eat as.

## 2024-05-23 NOTE — Progress Notes (Signed)
 Toribio Fortune, M.D. Gastroenterology & Hepatology [facility] Gastroenterology [address] [place], [place] [address]  Primary Care Physician: [name], MD [address] [place] [address]  I will communicate my assessment and recommendations to the referring MD via EMR.  Problems: Chronic diarrhea, likely due to IBS-D Lactose intolerance. History of C. difficile colitis requiring fecal transplant   History of Present Illness: Rhonda Andrews is a 83 y.o. female with a history of recurrent C.diff s/p fecal transplant, GERD, IBS-D, anxiety, HTN, diabetes, and colitis presents to the clinic for follow up of chronic diarrhea.  The patient was last seen on 11/02/2023. At that time, the patient was advised to take coleveselam,packs daily .  Unfortunately, the patient is very hesitant to use antibiotics given her history of C. Difficile. She eventually took the antibiotic but did not have any improvement.  She reports that she may have 2-4 mushy Bms per day.  Very occasionally has normal Bms. Usually has Bms in the morning, never at night. She is using Imodium  intermittently - may use 1/2 - 1pill every day.  Patient reports that she has had diarrhea for all her life and this has never been different.  The patient denies having any nausea, vomiting, fever, chills, hematochezia, melena, hematemesis, abdominal distention, abdominal pain, jaundice, pruritus or weight loss.  Previous workup :referred to [facility] for SIBO breath test, which came back normal.  Had sucrose deficiency testing which was negative.  Celiac disease panel was checked and was negative.   Last Colonoscopy: Last Colonoscopy: 2019 The perianal and digital rectal examinations were normal. A few diverticula were found in the sigmoid colon. The terminal ileum appeared normal. The exam was otherwise without abnormality on direct and retroflexion views. Fecal Microbiota  Transplant (Bacteriotherapy): Donor stool was prepared by a third party (purchased) as per protocol. Approximately 450 mL of the emulsified donor stool was instilled in the terminal ileum. A detailed colonoscopic detailed colonoscopic exam could not be performed upon scope withdrawal secondary to limited visibility from the instilled stool.  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)    Hypertension    IBS (irritable bowel syndrome)    Lactose intolerance    Palpitation    Palpitations    Vitamin D  deficiency disease 06/23/2019    Past Surgical History: Past Surgical History:  Procedure Laterality Date   ABDOMINAL HYSTERECTOMY     ovaries remained   CHOLECYSTECTOMY     COLONOSCOPY  2010   COLONOSCOPY WITH PROPOFOL  N/A 05/21/2018   Procedure: COLONOSCOPY WITH PROPOFOL ;  Surgeon: Avram Lupita BRAVO, MD;  Location: [facility];  Service: Endoscopy;  Laterality: N/A;  with FMT   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   Procedure: FECAL TRANSPLANT;  Surgeon: Avram Lupita BRAVO, MD;  Location: [facility];  Service: Endoscopy;;   KNEE ARTHROSCOPY Right    Precancerous  Tissue - Nose  February 14-2014    Family History: Family History  Problem Relation Age of Onset   Lymphoma Mother    Colon cancer Mother    Hypertension Mother    Migraines Mother    Other Father        Wagner's Granulermatosis   Seizures Sister    Cancer Maternal Grandmother  brain   Cancer Maternal Grandfather        brain   Other Paternal Grandmother        blood clot - DVT following surgery   Alcohol abuse Paternal Grandfather    Cirrhosis Paternal Grandfather    Thyroid  disease Sister    Heart disease Sister     Social History: Social History   Tobacco Use  Smoking Status Never  Smokeless  Tobacco Never   Social History   Substance and Sexual Activity  Alcohol Use No   Alcohol/week: 0.0 standard drinks of alcohol   Social History   Substance and Sexual Activity  Drug Use No    Allergies: Allergies  Allergen Reactions   Hydrocodone Nausea And Vomiting   Prednisone Other (See Comments)    Heart races    Medications: Current Outpatient Medications  Medication Sig Dispense Refill   Cholecalciferol (VITAMIN D -3) 125 MCG (5000 UT) TABS Take 1 tablet by mouth daily.     famotidine  (PEPCID ) 20 MG tablet Take 1 tablet (20 mg total) by mouth at bedtime. (Patient taking differently: Take 20 mg by mouth as needed.) 90 tablet 3   fluticasone  (FLONASE ) 50 MCG/ACT nasal spray PLACE 2 SPRAYS IN BOTH NOSTRILS DAILY. (Patient taking differently: as needed.) 16 g 3   loperamide  (IMODIUM  A-D) 2 MG tablet Take 0.5 tablets (1 mg total) by mouth 3 (three) times daily as needed for diarrhea or loose stools. Can take before each meal if needed. (Patient taking differently: Take 1 mg by mouth as needed. Can take before each meal if needed.) 90 tablet 1   meclizine  (ANTIVERT ) 12.5 MG tablet Take 1 tablet (12.5 mg total) by mouth 3 (three) times daily as needed for dizziness. 30 tablet 2   OVER THE COUNTER MEDICATION Essential Enzymes 500 mg 1/2 daily prn     propranolol  (INDERAL ) 60 MG tablet Take 1 tablet (60 mg total) by mouth 2 (two) times daily. (Patient taking differently: Take 60 mg by mouth daily at 6 (six) AM.) 60 tablet 0   Colesevelam  HCl 3.75 g PACK Take 1 packet (3.75 g total) by mouth daily. Take 4 hours apart from other medications 90 each 3   No current facility-administered medications for this visit.    Review of Systems: GENERAL: negative for malaise, night sweats HEENT: No changes in hearing or vision, no nose bleeds or other nasal problems. NECK: Negative for lumps, goiter, pain and significant neck swelling RESPIRATORY: Negative for cough, wheezing CARDIOVASCULAR:  Negative for chest pain, leg swelling, palpitations, orthopnea GI: SEE HPI MUSCULOSKELETAL: Negative for joint pain or swelling, back pain, and muscle pain. SKIN: Negative for lesions, rash PSYCH: Negative for sleep disturbance, mood disorder and recent psychosocial stressors. HEMATOLOGY Negative for prolonged bleeding, bruising easily, and swollen nodes. ENDOCRINE: Negative for cold or heat intolerance, polyuria, polydipsia and goiter. NEURO: negative for tremor, gait imbalance, syncope and seizures. The remainder of the review of systems is noncontributory.   Physical Exam: BP 130/63 (BP Location: Left Arm, Patient Position: Sitting, Cuff Size: Normal)   Pulse 74   Temp 98.4 F (36.9 C) (Temporal)   Ht 5' 5 (1.651 m)   Wt 136 lb 4.8 oz (61.8 kg)   BMI 22.68 kg/m  GENERAL: The patient is AO x3, in no acute distress. HEENT: Head is normocephalic and atraumatic. EOMI are intact. Mouth is well hydrated and without lesions. NECK: Supple. No masses LUNGS: Clear to auscultation. No presence of rhonchi/wheezing/rales. Adequate chest expansion HEART: RRR,  normal s1 and s2. ABDOMEN: Soft, nontender, no guarding, no peritoneal signs, and nondistended. BS +. No masses. EXTREMITIES: Without any cyanosis, clubbing, rash, lesions or edema. NEUROLOGIC: AOx3, no focal motor deficit. SKIN: no jaundice, no rashes  Imaging/Labs: as above  I personally reviewed and interpreted the available labs, imaging and endoscopic files.  Impression and Plan: Rhonda Andrews is a 83 y.o. female with a history of recurrent C.diff s/p fecal transplant, GERD, IBS-D, anxiety, HTN, diabetes, and colitis presents to the clinic for follow up of chronic diarrhea.  Patient has presented a chronic history of multiple bowel movements per day since she was a child.  Denies any red flag signs.  She has had extensive investigations in the past that have been negative. She has not presented any improvement with the use of  Xifaxan  and has been relatively well-controlled with Imodium .   I do consider that her symptoms are related to chronic IBS-D.  We discussed in detail that given the chronicity of her symptoms, no further workup is warranted but she needs to continue with Imodium  as needed to relieve her mildly increased bowel habits.  She can try using plant enzymes if she wants to eat vegetables.  -Continue with Imodium  half to 1 pill every day as needed for diarrhea - The patient should let us  know if presenting worsening diarrhea in the future, abdominal pain, rectal bleeding or black stools - Can try using Plant Enzymes if you want to eat as.  All questions were answered.      Toribio Fortune, MD Gastroenterology and Hepatology [facility]

## 2024-05-27 ENCOUNTER — Ambulatory Visit
Admission: RE | Admit: 2024-05-27 | Discharge: 2024-05-27 | Disposition: A | Discharge status: Home / Self Care | Source: Ambulatory Visit | Attending: Internal Medicine | Admitting: Internal Medicine

## 2024-05-27 ENCOUNTER — Other Ambulatory Visit: Payer: Self-pay | Admitting: Internal Medicine

## 2024-05-27 DIAGNOSIS — R928 Other abnormal and inconclusive findings on diagnostic imaging of breast: Secondary | ICD-10-CM

## 2024-05-27 DIAGNOSIS — N6315 Unspecified lump in the right breast, overlapping quadrants: Secondary | ICD-10-CM | POA: Diagnosis not present

## 2024-05-30 ENCOUNTER — Ambulatory Visit
Admission: RE | Admit: 2024-05-30 | Discharge: 2024-05-30 | Disposition: A | Discharge status: Home / Self Care | Source: Ambulatory Visit | Attending: Internal Medicine | Admitting: Internal Medicine

## 2024-05-30 DIAGNOSIS — C50811 Malignant neoplasm of overlapping sites of right female breast: Secondary | ICD-10-CM | POA: Diagnosis not present

## 2024-05-30 DIAGNOSIS — Z170 Estrogen receptor positive status [ER+]: Secondary | ICD-10-CM | POA: Diagnosis not present

## 2024-05-30 DIAGNOSIS — R928 Other abnormal and inconclusive findings on diagnostic imaging of breast: Secondary | ICD-10-CM

## 2024-05-30 DIAGNOSIS — N6311 Unspecified lump in the right breast, upper outer quadrant: Secondary | ICD-10-CM

## 2024-05-30 DIAGNOSIS — N6315 Unspecified lump in the right breast, overlapping quadrants: Secondary | ICD-10-CM | POA: Diagnosis not present

## 2024-05-30 HISTORY — PX: BREAST BIOPSY: SHX20

## 2024-05-31 LAB — SURGICAL PATHOLOGY

## 2024-06-01 ENCOUNTER — Telehealth: Payer: Self-pay | Admitting: Cardiology

## 2024-06-01 DIAGNOSIS — E559 Vitamin D deficiency, unspecified: Secondary | ICD-10-CM | POA: Diagnosis not present

## 2024-06-01 DIAGNOSIS — C801 Malignant (primary) neoplasm, unspecified: Secondary | ICD-10-CM

## 2024-06-01 DIAGNOSIS — R002 Palpitations: Secondary | ICD-10-CM | POA: Diagnosis not present

## 2024-06-01 DIAGNOSIS — R42 Dizziness and giddiness: Secondary | ICD-10-CM | POA: Diagnosis not present

## 2024-06-01 DIAGNOSIS — M810 Age-related osteoporosis without current pathological fracture: Secondary | ICD-10-CM | POA: Diagnosis not present

## 2024-06-01 DIAGNOSIS — Z6822 Body mass index (BMI) 22.0-22.9, adult: Secondary | ICD-10-CM | POA: Diagnosis not present

## 2024-06-01 HISTORY — DX: Malignant (primary) neoplasm, unspecified: C80.1

## 2024-06-01 NOTE — Telephone Encounter (Signed)
 Pt stated she was just diagnosed with breast cancer yesterday and she'd like to speak with Dr. Alvan or a nurse regarding a plan moving forward for her BP. She sees the surgeon tomorrow to speak about the plan they'll take but she'll need to give them updates from cardiology regarding her bp. Please advise.

## 2024-06-02 ENCOUNTER — Ambulatory Visit: Payer: Self-pay | Admitting: Surgery

## 2024-06-02 ENCOUNTER — Encounter: Payer: Self-pay | Admitting: Surgery

## 2024-06-02 DIAGNOSIS — C50911 Malignant neoplasm of unspecified site of right female breast: Secondary | ICD-10-CM

## 2024-06-03 ENCOUNTER — Telehealth (HOSPITAL_BASED_OUTPATIENT_CLINIC_OR_DEPARTMENT_OTHER): Payer: Self-pay | Admitting: *Deleted

## 2024-06-03 ENCOUNTER — Telehealth: Payer: Self-pay

## 2024-06-03 NOTE — Telephone Encounter (Signed)
  Patient Consent for Virtual Visit         Rhonda Andrews has provided verbal consent on 06/03/2024 for a virtual visit (video or telephone). Appointment scheduled for 06/06/2024 Med req and consent are complete. Call patient at [phone].    CONSENT FOR VIRTUAL VISIT FOR:  Rhonda Andrews  By participating in this virtual visit I agree to the following:  I hereby voluntarily request, consent and authorize [facility] and its employed or contracted physicians, physician assistants, nurse practitioners or other licensed health care professionals (the Practitioner), to provide me with telemedicine health care services (the "Services) as deemed necessary by the treating Practitioner. I acknowledge and consent to receive the Services by the Practitioner via telemedicine. I understand that the telemedicine visit will involve communicating with the Practitioner through live audiovisual communication technology and the disclosure of certain medical information by electronic transmission. I acknowledge that I have been given the opportunity to request an in-person assessment or other available alternative prior to the telemedicine visit and am voluntarily participating in the telemedicine visit.  I understand that I have the right to withhold or withdraw my consent to the use of telemedicine in the course of my care at any time, without affecting my right to future care or treatment, and that the Practitioner or I may terminate the telemedicine visit at any time. I understand that I have the right to inspect all information obtained and/or recorded in the course of the telemedicine visit and may receive copies of available information for a reasonable fee.  I understand that some of the potential risks of receiving the Services via telemedicine include:  Delay or interruption in medical evaluation due to technological equipment failure or disruption; Information transmitted may not be  sufficient (e.g. poor resolution of images) to allow for appropriate medical decision making by the Practitioner; and/or  In rare instances, security protocols could fail, causing a breach of personal health information.  Furthermore, I acknowledge that it is my responsibility to provide information about my medical history, conditions and care that is complete and accurate to the best of my ability. I acknowledge that Practitioner's advice, recommendations, and/or decision may be based on factors not within their control, such as incomplete or inaccurate data provided by me or distortions of diagnostic images or specimens that may result from electronic transmissions. I understand that the practice of medicine is not an exact science and that Practitioner makes no warranties or guarantees regarding treatment outcomes. I acknowledge that a copy of this consent can be made available to me via my patient portal [facility] MyChart), or I can request a printed copy by calling the office of [facility].    I understand that my insurance will be billed for this visit.   I have read or had this consent read to me. I understand the contents of this consent, which adequately explains the benefits and risks of the Services being provided via telemedicine.  I have been provided ample opportunity to ask questions regarding this consent and the Services and have had my questions answered to my satisfaction. I give my informed consent for the services to be provided through the use of telemedicine in my medical care

## 2024-06-03 NOTE — Telephone Encounter (Signed)
   Name: Rhonda Andrews  DOB: May 01, 1941  MRN: [id number]  Primary Cardiologist: Alvan Carrier, MD   Preoperative team, please contact this patient and set up a phone call appointment for further preoperative risk assessment. Please obtain consent and complete medication review. Thank you for your help.  I confirm that guidance regarding antiplatelet and oral anticoagulation therapy has been completed and, if necessary, noted below.  Patient is not on anticoagulation or antiplatelet per review of medical record in Epic.    I also confirmed the patient resides in the state of  . As per [facility] Board telemedicine laws, the patient must reside in the state in which the provider is licensed.   Lamarr Satterfield, NP 06/03/2024, 10:23 AM [facility]

## 2024-06-03 NOTE — Telephone Encounter (Signed)
 Appointment scheduled for 06/06/2024 Med req and consent are complete. Call patient at [phone].

## 2024-06-03 NOTE — Telephone Encounter (Signed)
   Pre-operative Risk Assessment    Patient Name: Rhonda Andrews  DOB: 1940-12-19 MRN: [id number]   Date of last office visit: 11/25/23 DR. BRANCH Date of next office visit: 07/11/24 DR. BRANCH-6 MONTH F/U   Request for Surgical Clearance    Procedure:  LUMPECTOMY; BREAST CANCER STAGE 1  Date of Surgery:  Clearance TBD                                Surgeon:  DR. DEBBY SHIPPER Surgeon's Group or Practice Name:  [facility] Phone number:  [phone] Fax number:  [phone] MICHELLE BROOKS, CMA   Type of Clearance Requested:   - Medical ; NONE INDICATED PER FORM TO BE HELD   Type of Anesthesia:  General    Additional requests/questions:    Bonney Niels Jest   06/03/2024, 10:10 AM

## 2024-06-06 ENCOUNTER — Ambulatory Visit: Attending: Cardiology | Admitting: Student

## 2024-06-06 DIAGNOSIS — Z01810 Encounter for preprocedural cardiovascular examination: Secondary | ICD-10-CM

## 2024-06-06 NOTE — Progress Notes (Signed)
 Virtual Visit via Telephone Note   Because of Rhonda Andrews's co-morbid illnesses, she is at least at moderate risk for complications without adequate follow up.  This format is felt to be most appropriate for this patient at this time.  The patient did not have access to video technology/had technical difficulties with video requiring transitioning to audio format only (telephone).  All issues noted in this document were discussed and addressed.  No physical exam could be performed with this format.  Please refer to the patient's chart for her consent to telehealth for [facility].  Evaluation Performed:  Preoperative cardiovascular risk assessment _____________   Date:  06/06/2024   Patient ID:  Rhonda Andrews, DOB November 23, 1940, MRN [id number] Patient Location:  Home Provider location:   Office  Primary Care Provider:  [name], MD Primary Cardiologist:  Alvan Carrier, MD  Chief Complaint / Patient Profile   83 y.o. y/o female with a h/o palpitations, hypertension, GERD who is pending lumpectomy for stage I breast cancer by Dr. Vanderbilt and presents today for telephonic preoperative cardiovascular risk assessment.  History of Present Illness    [name] is a 83 y.o. female who presents via audio/video conferencing for a telehealth visit today.  Pt was last seen in cardiology clinic on 11/25/2023 by Dr. Alvan.  At that time Rhonda Andrews was stable from a cardiac standpoint.  The patient is now pending procedure as outlined above. Since her last visit, she is doing well. Patient denies shortness of breath, dyspnea on exertion, lower extremity edema, orthopnea or PND. No chest pain, pressure, or tightness. No palpitations. No lightheadedness, dizziness, presyncope or syncope. She is very active performing yard work and all household activities. 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 She is very concerned about her BP. She describes  what sounds like whitecoat hypertension. She recalls her BP reading at visit with Dr. Vanderbilt 174/82. However, she checks her BP regularly at home. She gets the highest readings first thing in the am typically 145-150/70s. By the late morning/early afternoon BP is 109-120/60-70.  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)    Hypertension    IBS (irritable bowel syndrome)    Lactose intolerance    Palpitation    Palpitations    Vitamin D  deficiency disease 06/23/2019   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 Authorizing Provider  Cholecalciferol (VITAMIN D -3) 125 MCG (5000 UT) TABS Take 1 tablet by mouth daily.    [provider]  Colesevelam  HCl 3.75 g PACK Take 1 packet (3.75 g total) by mouth daily. Take 4 hours apart from other medications 11/02/23   Rhonda Flavors, Toribio, MD  famotidine  (PEPCID ) 20 MG tablet Take  1 tablet (20 mg total) by mouth at bedtime. Patient taking differently: Take 20 mg by mouth as needed. 08/14/22   Castaneda Mayorga, Daniel, MD  fluticasone  (FLONASE ) 50 MCG/ACT nasal spray PLACE 2 SPRAYS IN BOTH NOSTRILS DAILY. Patient taking differently: as needed. 11/21/20   Gosrani, Nimish C, MD  loperamide  (IMODIUM  A-D) 2 MG tablet Take 0.5 tablets (1 mg total) by mouth 3 (three) times daily as needed for diarrhea or loose stools. Can take before each meal if needed. Patient taking differently: Take 1 mg by mouth as needed. Can take before each meal if needed. 03/18/23   Kennedy Charmaine CROME, NP  meclizine  (ANTIVERT ) 12.5 MG tablet Take 1 tablet (12.5 mg total) by mouth 3 (three) times daily as needed for dizziness. 04/17/20   Elnor Lauraine BRAVO, NP  OVER THE COUNTER MEDICATION Essential Enzymes 500 mg 1/2 daily prn    [provider]  propranolol  (INDERAL ) 60 MG tablet Take 1 tablet (60 mg total) by mouth 2 (two) times daily. Patient taking differently: Take 60 mg by mouth daily at 6 (six) AM. 11/13/23   Branch, Dorn FALCON, MD    Physical Exam    Vital Signs:  [place] ONEIDA Gander provides vital signs performed at home today: BP 121/61, HR 62.  Given telephonic nature of communication, physical exam is limited. AAOx3. NAD. Normal affect.  Speech and respirations are unlabored.   Assessment & Plan    Primary Cardiologist: Alvan Dorn, MD  Preoperative cardiovascular risk assessment.  Lumpectomy for stage I breast cancer by Dr. Vanderbilt  Chart reviewed as part of pre-operative protocol coverage. According to the RCRI, patient has a 0.4% risk of MACE. Patient reports activity equivalent to >4.0 METS (performs heavy yard work and all household activities, cares for husband).   Given past medical history and time since last visit, based on ACC/AHA guidelines, BEATRYCE COLOMBO would be at acceptable risk for the planned procedure without further cardiovascular testing.   Patient was  advised that if she develops new symptoms prior to surgery to contact our office to arrange a follow-up appointment.  she verbalized understanding.   I will route this recommendation to the requesting party via Epic fax function.  Please call with questions.  Time:   Today, I have spent 10 minutes with the patient with telehealth technology discussing medical history, symptoms, and management plan.     Barnie Hila, NP  06/06/2024, 8:12 AM

## 2024-06-07 NOTE — Telephone Encounter (Signed)
 Patient notified and verbalized understanding. Patient had no questions or concerns at this time.

## 2024-06-09 ENCOUNTER — Telehealth: Payer: Self-pay | Admitting: Cardiology

## 2024-06-09 NOTE — Telephone Encounter (Signed)
 Mrs. Rhonda Andrews walked into the [facility] today with concerns over a text message that she received from Dr. Milta office. The message states that she has been approved for surgery. Mrs. Rhonda Andrews was wanting to know if we knew anything about this message. She had a telephone pre op on 06-06-2024. She is going to call the surgical center back and try to clarify if surgery has been scheduled.

## 2024-06-10 ENCOUNTER — Other Ambulatory Visit: Payer: Self-pay | Admitting: Surgery

## 2024-06-10 ENCOUNTER — Other Ambulatory Visit: Payer: Self-pay | Admitting: Radiation Oncology

## 2024-06-10 ENCOUNTER — Inpatient Hospital Stay
Admission: RE | Admit: 2024-06-10 | Discharge: 2024-06-10 | Disposition: A | Discharge status: Home / Self Care | Payer: Self-pay | Source: Ambulatory Visit | Attending: Radiation Oncology | Admitting: Radiation Oncology

## 2024-06-10 DIAGNOSIS — C50911 Malignant neoplasm of unspecified site of right female breast: Secondary | ICD-10-CM

## 2024-06-14 ENCOUNTER — Telehealth: Payer: Self-pay | Admitting: Cardiology

## 2024-06-14 ENCOUNTER — Encounter: Payer: Self-pay | Admitting: Cardiology

## 2024-06-14 ENCOUNTER — Ambulatory Visit: Attending: Cardiology | Admitting: Cardiology

## 2024-06-14 VITALS — BP 140/78 | HR 62 | Ht 65.0 in | Wt 136.2 lb

## 2024-06-14 DIAGNOSIS — R002 Palpitations: Secondary | ICD-10-CM | POA: Diagnosis not present

## 2024-06-14 DIAGNOSIS — C50411 Malignant neoplasm of upper-outer quadrant of right female breast: Secondary | ICD-10-CM | POA: Insufficient documentation

## 2024-06-14 DIAGNOSIS — R030 Elevated blood-pressure reading, without diagnosis of hypertension: Secondary | ICD-10-CM

## 2024-06-14 MED ORDER — PROPRANOLOL HCL 60 MG PO TABS: 60.0000 mg | ORAL_TABLET | Freq: Two times a day (BID) | ORAL | 3 refills | Status: AC

## 2024-06-14 NOTE — Progress Notes (Signed)
 Clinical Summary Rhonda Andrews is a 83 y.o.female seen today for follow up of the following medical problems.    1. Palpitations/Dizziness - previous holter 05/2009 showed no significant arrhythmias.   - during prior admission did have short episode of PSVT - she was changed from propanolol to atenolol . With change had increase in symptoms and she changed herself back to propanolol - she reports short acting propanolol does not work as well, prefers long acting.    - diltiazem  caused low bp's at home per her report, she stopped taking.     - tried midodine x 2 weeks, SBP 130s. Reports insomnia and she stopped taking. We had tried to see if could stabilize her bp to allow more stable use of her beta blocker   - previously she was on an unconvnetional way of taking propranolol  long acting that she has done for many years, she splits the individual capsule into 2 separate doses. We have tried several other doses and meds which she did not tolerate and thus have accepted this atypical regimen.   - prn propranolol  is controlling her symptoms   2.Elevated blood pressure - occasional high bp'at times, not consistent. - previously tried low dose norvasc  2.5mg , did not tolerate due to headache, dizziness, LE edema.  - home bp's variable, sbp ca be 120s to 140s at times, rarely 150s or higher. Checks bp multiple times a day, sometimes checks when stressed or anxous.      2. 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     famotidine  (PEPCID ) 20 MG tablet Take 1 tablet (20 mg total) by mouth at bedtime. (Patient taking differently: Take 20 mg by mouth as needed.) 90 tablet 3   fluticasone  (FLONASE ) 50 MCG/ACT nasal spray PLACE 2 SPRAYS IN BOTH NOSTRILS DAILY. (Patient taking differently: as needed.) 16 g 3   loperamide  (IMODIUM  A-D) 2 MG tablet Take 0.5 tablets (1 mg total) by mouth 3 (three) times daily as needed for diarrhea or loose stools. Can take before each meal if needed. (Patient taking differently: Take 1 mg by mouth as needed. Can take before each meal if needed.) 90 tablet 1   meclizine  (ANTIVERT ) 12.5 MG tablet Take 1 tablet (12.5 mg total) by mouth 3 (three) times daily as needed for dizziness. 30 tablet 2   OVER THE COUNTER MEDICATION Essential Enzymes 500 mg 1/2 daily prn     propranolol  (INDERAL ) 60 MG tablet Take 1 tablet (60 mg total) by mouth 2 (two) times daily. (Patient taking differently: Take 60 mg by mouth daily at 6 (six) AM.) 60 tablet 0   No current facility-administered medications for this visit.  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   Procedure: FECAL TRANSPLANT;  Surgeon: Avram Lupita BRAVO, MD;  Location: [facility];  Service: Endoscopy;;   KNEE ARTHROSCOPY Right    Precancerous   Tissue - Nose  February 14-2014     Allergies  Allergen Reactions   Hydrocodone Nausea And Vomiting   Prednisone Other (See Comments)    Heart races      Family History  Problem Relation Age of Onset   Lymphoma Mother    Colon cancer Mother    Hypertension Mother    Migraines Mother    Other Father        Wagner's Granulermatosis   Seizures Sister    Brain cancer Maternal Grandmother    Brain cancer Maternal Grandfather    Other Paternal Grandmother        blood clot - DVT following surgery   Alcohol abuse Paternal Grandfather    Cirrhosis Paternal Grandfather    Thyroid  disease Sister    Heart disease Sister      Social History Ms. Mcadams reports that she has never smoked. She has never used smokeless tobacco. Ms. Herrin reports no history of alcohol use.   Physical Examination Today's Vitals   06/14/24 1456 06/14/24 1535  BP: (!) 170/74 (!) 140/78  Pulse: 62   SpO2: 98%   Weight: 136 lb 3.2 oz (61.8 kg)   Height: 5' 5 (1.651 m)    Body mass index is 22.66 kg/m.  Gen: resting comfortably, no acute distress HEENT: no scleral icterus, pupils equal round and reactive, no palptable cervical adenopathy,  CV: RRR, no m/rg, no jvd Resp: Clear to auscultation bilaterally GI: abdomen is soft, non-tender, non-distended, normal bowel sounds, no hepatosplenomegaly MSK: extremities are warm, no edema.  Skin: warm, no rash Neuro:  no focal deficits Psych: appropriate affect   Diagnostic Studies     Assessment and Plan   1. Palpitations - long history, only medication she has tolerated is prn propanolol. Atypical in she will cut the pill in quarters. We have tried several other regimens and has not tolerated so we have accepted this atypical regimen.  - no recent issues, continue current meds   2.Elevated blood pressure - occasional high bp's. She at times checks multiple times a day, sometimes when stressed or anxious. For the majority home bp's are  controlled. Prior issues with dizziness on just low dose norvasc  in the past 2.5mg , in general very sensitive to medications.  - Unless bp's are clearly consistently elevated would not reattempt bp medication - of note 20 point difference between our manual cuff and her electronic, with hers being higher    Dorn PHEBE Ross, M.D.

## 2024-06-14 NOTE — Telephone Encounter (Signed)
 Pt c/o BP issue: STAT if pt c/o blurred vision, one-sided weakness or slurred speech.  STAT if BP is GREATER than 180/120 TODAY.  STAT if BP is LESS than 90/60 and SYMPTOMATIC TODAY  1. What is your BP concern? Pt concerned her bp is high when she stand up. She is scheduled  at 3pm with Dr. Alvan but asked if she should go to ED instead. She is currently sitting down.   2. Have you taken any BP medication today? Yes   3. What are your last 5 BP readings?  145/85 - first thing in the morning  201/124 -  after she stood up  150/? - After taking meds  187/? - After she stood back up  184/98 - while on the phone with me    4. Are you having any other symptoms (ex. Dizziness, headache, blurred vision, passed out)? Dizziness

## 2024-06-14 NOTE — Patient Instructions (Signed)
 Medication Instructions:  Your physician recommends that you continue on your current medications as directed. Please refer to the Current Medication list given to you today.  *If you need a refill on your cardiac medications before your next appointment, please call your pharmacy*  Lab Work: None If you have labs (blood work) drawn today and your tests are completely normal, you will receive your results only by: MyChart Message (if you have MyChart) OR A paper copy in the mail If you have any lab test that is abnormal or we need to change your treatment, we will call you to review the results.  Testing/Procedures: None  Follow-Up: At [facility], you and your health needs are our priority.  As part of our continuing mission to provide you with exceptional heart care, our providers are all part of one team.  This team includes your primary Cardiologist (physician) and Advanced Practice Providers or APPs (Physician Assistants and Nurse Practitioners) who all work together to provide you with the care you need, when you need it.  Your next appointment:   4 month(s)  Provider:   You may see Dina Rich, MD or one of the following Advanced Practice Providers on your designated Care Team:   Randall An, PA-C  Scotesia [place], [place] Jacolyn Reedy, [place]     We recommend signing up for the patient portal called "MyChart".  Sign up information is provided on this After Visit Summary.  MyChart is used to connect with patients for Virtual Visits (Telemedicine).  Patients are able to view lab/test results, encounter notes, upcoming appointments, etc.  Non-urgent messages can be sent to your provider as well.   To learn more about what you can do with MyChart, go to [email or web address].   Other Instructions

## 2024-06-14 NOTE — Telephone Encounter (Signed)
 I spoke with patient and she tells me she has dizziness this morning but has not yet taken her meclizine . She has not eaten yet either. I asked her to bring her BP machine so we can compare with our equipment.She tells me it has been checked multiple times but agrees to bring it.She will keep her appointment today.

## 2024-06-14 NOTE — Progress Notes (Signed)
 Radiation Oncology         [phone] ________________________________  Name: Rhonda Andrews        MRN: [id number]  Date of Service: 06/21/2024 DOB: 09-May-1941  CC:Rhonda Vaughn FALCON, MD  Vanderbilt Ned, MD     REFERRING PHYSICIAN: Vanderbilt Ned, MD   DIAGNOSIS: The encounter diagnosis was Malignant neoplasm of upper-outer quadrant of right breast in female, estrogen receptor positive (HCC).   HISTORY OF PRESENT ILLNESS: Rhonda Andrews is a 83 y.o. female seen at the request of Dr. Vanderbilt for a new diagnosis of right breast cancer. The patient was noted to have a screening detected possible mass in the right breast.  She returned on 05/27/2024 for diagnostic workup.  The mass was reproducible on spot compression imaging in the upper outer quadrant between 9 and 10:00.  On exam the mass was palpable with a focal area of skin elevation and discoloration.  By ultrasound, there was a hypoechoic mass in the 9 o'clock position of the right breast measuring 1.6 cm in greatest dimension.  A tract of hypoechogenicity extends from the mass to the skin and the axilla was negative for adenopathy.  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  Her cancer was ER/PR positive, HER2 negative with a Ki-67 of 5%.  She met with Dr. Vanderbilt and he offered lumpectomy if she was able to have cardiac clearance.  This is scheduled for 06/28/2024.  She is seen to discuss adjuvant therapy with radiation.    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)    Hypertension    IBS (irritable bowel syndrome)    Lactose intolerance     Palpitation    Palpitations    Vitamin D  deficiency disease 06/23/2019       PAST SURGICAL HISTORY: Past Surgical History:  Procedure Laterality Date   ABDOMINAL HYSTERECTOMY     ovaries remained   ABDOMINAL HYSTERECTOMY     BREAST BIOPSY Right 05/30/2024   US  RT BREAST BX W LOC DEV 1ST LESION IMG BX SPEC US  GUIDE 05/30/2024 [facility]   CHOLECYSTECTOMY     COLONOSCOPY  2010   COLONOSCOPY WITH PROPOFOL  N/A 05/21/2018   Procedure: COLONOSCOPY WITH PROPOFOL ;  Surgeon: Avram Lupita BRAVO, MD;  Location: [facility];  Service: Endoscopy;  Laterality: N/A;  with FMT   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   Procedure: FECAL TRANSPLANT;  Surgeon: Avram Lupita BRAVO, MD;  Location: [facility];  Service: Endoscopy;;   KNEE ARTHROSCOPY Right    Precancerous  Tissue - Nose  10/15/2012     FAMILY HISTORY:  Family History  Problem Relation Age of Onset   Lymphoma Mother    Colon cancer Mother    Hypertension Mother    Migraines Mother    Other Father        Wagner's Granulermatosis   Seizures Sister    Brain cancer Maternal Grandmother    Brain cancer Maternal Grandfather    Other Paternal Grandmother        blood clot - DVT following surgery   Alcohol abuse Paternal Grandfather    Cirrhosis Paternal Grandfather  Thyroid  disease Sister    Heart disease Sister      SOCIAL HISTORY:  reports that she has never smoked. She has never used smokeless tobacco. She reports that she does not drink alcohol and does not use drugs.   ALLERGIES: Hydrocodone and Prednisone   MEDICATIONS:  Current Outpatient Medications  Medication Sig Dispense Refill   Cholecalciferol (VITAMIN D -3) 125 MCG (5000 UT) TABS Take 1 tablet by mouth daily.     famotidine  (PEPCID ) 20 MG tablet Take 1 tablet (20 mg total) by mouth at bedtime. (Patient taking differently: Take 20 mg by mouth as needed.) 90 tablet 3   fluticasone  (FLONASE ) 50 MCG/ACT nasal spray PLACE 2 SPRAYS IN BOTH NOSTRILS DAILY. (Patient taking  differently: as needed.) 16 g 3   loperamide  (IMODIUM  A-D) 2 MG tablet Take 0.5 tablets (1 mg total) by mouth 3 (three) times daily as needed for diarrhea or loose stools. Can take before each meal if needed. (Patient taking differently: Take 1 mg by mouth as needed. Can take before each meal if needed.) 90 tablet 1   meclizine  (ANTIVERT ) 12.5 MG tablet Take 1 tablet (12.5 mg total) by mouth 3 (three) times daily as needed for dizziness. 30 tablet 2   OVER THE COUNTER MEDICATION Essential Enzymes 500 mg 1/2 daily prn     propranolol  (INDERAL ) 60 MG tablet Take 1 tablet (60 mg total) by mouth 2 (two) times daily. 60 tablet 3   No current facility-administered medications for this encounter.     REVIEW OF SYSTEMS: On review of systems, the patient reports that she is doing well overall. No breast specific complaints are verbalized.      PHYSICAL EXAM:  Wt Readings from Last 3 Encounters:  06/21/24 137 lb 8 oz (62.4 kg)  06/14/24 136 lb 3.2 oz (61.8 kg)  05/23/24 136 lb 4.8 oz (61.8 kg)   Temp Readings from Last 3 Encounters:  06/21/24 (!) 97.5 F (36.4 C) (Temporal)  05/23/24 98.4 F (36.9 C) (Temporal)  12/18/23 98.3 F (36.8 C)   BP Readings from Last 3 Encounters:  06/21/24 (!) 150/86  06/14/24 (!) 140/78  05/23/24 130/63   Pulse Readings from Last 3 Encounters:  06/21/24 64  06/14/24 62  05/23/24 74    In general this is a well appearing caucasian female in no acute distress. She's alert and oriented x4 and appropriate throughout the examination. Cardiopulmonary assessment is negative for acute distress and she exhibits normal effort. Bilateral breast exam is deferred.    ECOG = 0  0 - Asymptomatic (Fully active, able to carry on all predisease activities without restriction)  1 - Symptomatic but completely ambulatory (Restricted in physically strenuous activity but ambulatory and able to carry out work of a light or sedentary nature. For example, light housework,  office work)  2 - Symptomatic, <50% in bed during the day (Ambulatory and capable of all self care but unable to carry out any work activities. Up and about more than 50% of waking hours)  3 - Symptomatic, >50% in bed, but not bedbound (Capable of only limited self-care, confined to bed or chair 50% or more of waking hours)  4 - Bedbound (Completely disabled. Cannot carry on any self-care. Totally confined to bed or chair)  5 - Death   Raylene MM, Creech RH, Tormey DC, et al. [phone]). Toxicity and response criteria of the [facility]. Am. DOROTHA Bridges. Oncol. 5 (6): 649-55    LABORATORY DATA:  Lab  Results  Component Value Date   WBC 8.9 06/21/2024   HGB 16.0 (H) 06/21/2024   HCT 47.5 (H) 06/21/2024   MCV 90.5 06/21/2024   PLT 248 06/21/2024   Lab Results  Component Value Date   NA 140 06/21/2024   K 4.4 06/21/2024   CL 104 06/21/2024   CO2 32 06/21/2024   Lab Results  Component Value Date   ALT 13 06/21/2024   AST 17 06/21/2024   ALKPHOS 75 06/21/2024   BILITOT 0.7 06/21/2024      RADIOGRAPHY: US  RT BREAST BX W LOC DEV 1ST LESION IMG BX SPEC US  GUIDE Addendum Date: 06/01/2024 ADDENDUM REPORT: 06/01/2024 10:49 ADDENDUM: PATHOLOGY revealed: 1. Breast, right, needle core biopsy, 9:00; 4 cmfn (heart clip)- INVASIVE MODERATELY DIFFERENTIATED DUCTAL ADENOCARCINOMA WITH EXTENSIVE EXTRACELLULAR MUCIN, GRADE 2 (3+2+1)- FOCAL DUCTAL CARCINOMA IN SITU, INTERMEDIATE NUCLEAR GRADE, SOLID TYPE INVOLVING A PAPILLOMA- OVERALL GRADE: GRADE 2 (6/9)- NEGATIVE FOR ANGIOLYMPHATIC INVASION - TUMOR MEASURES 12 MM IN GREATEST LINEAR EXTENT Pathology results are CONCORDANT with imaging findings, per Dr. Alm Parkins. Pathology results and recommendations were discussed with patient via telephone on 05/31/24 by Rock Hover RN. Patient reported biopsy site doing well with no adverse symptoms, and only slight tenderness at the site. Post biopsy care instructions were reviewed, questions  were answered and my direct phone number was provided. Patient was instructed to call [facility] for any additional questions or concerns related to biopsy site. RECOMMENDATION: Surgical and oncological consultation. Request for surgical consultation relayed to [facility] and Olam Bunnell at [facility] on 05/31/2024 by Rock Hover RN. Pathology results reported by Rock Hover RN on 05/31/2024. Electronically Signed   By: Alm Parkins M.D.   On: 06/01/2024 10:49   Result Date: 06/01/2024 CLINICAL DATA:  Patient presents for ultrasound-guided core needle biopsy of a right breast mass. EXAM: ULTRASOUND GUIDED RIGHT BREAST CORE NEEDLE BIOPSY COMPARISON:  Previous exam(s). PROCEDURE: I met with the patient and we discussed the procedure of ultrasound-guided biopsy, including benefits and alternatives. We discussed the high likelihood of a successful procedure. We discussed the risks of the procedure, including infection, bleeding, tissue injury, clip migration, and inadequate sampling. Informed written consent was given. The usual time-out protocol was performed immediately prior to the procedure. Lesion quadrant: Upper outer quadrant: Near 9 o'clock, 4 cm from the nipple. Using sterile technique and 1% Lidocaine  as local anesthetic, under direct ultrasound visualization, a 12 gauge spring-loaded device was used to perform biopsy of the mass in the right breast at 9 o'clock using an inferior approach. At the conclusion of the procedure a heart shaped tissue marker clip was deployed into the biopsy cavity. Follow up 2 view mammogram was performed and dictated separately. IMPRESSION: Ultrasound guided biopsy of a right breast mass. No apparent complications. Electronically Signed: By: Alm Parkins M.D. On: 05/30/2024 13:12   MM CLIP PLACEMENT RIGHT Result Date: 05/30/2024 CLINICAL DATA:  Assess post biopsy marker clip placement following ultrasound-guided core needle biopsy of a  right breast mass. EXAM: 3D DIAGNOSTIC RIGHT MAMMOGRAM POST ULTRASOUND BIOPSY COMPARISON:  Previous exam(s). ACR Breast Density Category b: There are scattered areas of fibroglandular density. FINDINGS: 3D Mammographic images were obtained following ultrasound guided biopsy of a right breast mass. The biopsy marking clip is in expected position at the site of biopsy. IMPRESSION: Appropriate positioning of the heart shaped biopsy marking clip at the site of biopsy in the mass in the lateral right breast. Final Assessment:  Post Procedure Mammograms for Marker Placement Electronically Signed   By: Alm Parkins M.D.   On: 05/30/2024 13:21   MM 3D DIAGNOSTIC MAMMOGRAM UNILATERAL RIGHT BREAST Result Date: 05/27/2024 CLINICAL DATA:  Recall from screening for a possible right breast mass. EXAM: DIGITAL DIAGNOSTIC UNILATERAL RIGHT MAMMOGRAM WITH TOMOSYNTHESIS AND CAD; ULTRASOUND RIGHT BREAST LIMITED TECHNIQUE: Right digital diagnostic mammography and breast tomosynthesis was performed. The images were evaluated with computer-aided detection. ; Targeted ultrasound examination of the right breast was performed COMPARISON:  Previous exam(s). ACR Breast Density Category b: There are scattered areas of fibroglandular density. FINDINGS: On spot compression imaging, the possible mass noted on the current screening exam persists as a rounded, 1.4 cm mass with well-defined, but mildly lobulated margins. It lies in the upper outer breast between 9 and 10 o'clock. On physical exam, there is a firm mass in the right breast at 9 o'clock with an overlying focal area of skin elevation and discoloration. Targeted right breast ultrasound is performed, showing an oval hypoechoic mass with partly circumscribed partly lobulated margins in the right breast at 9 o'clock, 4 cm the nipple, measuring 1.6 x 0.9 x 1.4 cm. There is internal blood flow on color Doppler analysis. A tract of hypoechogenicity extends from the mass to the skin.  Sonographic evaluation of the right axilla demonstrates normal lymph nodes. No enlarged or abnormal lymph nodes. IMPRESSION: 1. Suspicious 1.6 cm mass in the right breast at 9 o'clock. Tissue sampling is indicated. RECOMMENDATION: 1. Ultrasound-guided core needle biopsy the 1.6 cm right breast mass at 9 o'clock. This procedure was scheduled prior to the patient leaving the [facility] Center. I have discussed the findings and recommendations with the patient. If applicable, a reminder letter will be sent to the patient regarding the next appointment. BI-RADS CATEGORY  4: Suspicious. Electronically Signed   By: Alm Parkins M.D.   On: 05/27/2024 13:58   US  LIMITED ULTRASOUND INCLUDING AXILLA RIGHT BREAST Result Date: 05/27/2024 CLINICAL DATA:  Recall from screening for a possible right breast mass. EXAM: DIGITAL DIAGNOSTIC UNILATERAL RIGHT MAMMOGRAM WITH TOMOSYNTHESIS AND CAD; ULTRASOUND RIGHT BREAST LIMITED TECHNIQUE: Right digital diagnostic mammography and breast tomosynthesis was performed. The images were evaluated with computer-aided detection. ; Targeted ultrasound examination of the right breast was performed COMPARISON:  Previous exam(s). ACR Breast Density Category b: There are scattered areas of fibroglandular density. FINDINGS: On spot compression imaging, the possible mass noted on the current screening exam persists as a rounded, 1.4 cm mass with well-defined, but mildly lobulated margins. It lies in the upper outer breast between 9 and 10 o'clock. On physical exam, there is a firm mass in the right breast at 9 o'clock with an overlying focal area of skin elevation and discoloration. Targeted right breast ultrasound is performed, showing an oval hypoechoic mass with partly circumscribed partly lobulated margins in the right breast at 9 o'clock, 4 cm the nipple, measuring 1.6 x 0.9 x 1.4 cm. There is internal blood flow on color Doppler analysis. A tract of hypoechogenicity extends from the mass to the  skin. Sonographic evaluation of the right axilla demonstrates normal lymph nodes. No enlarged or abnormal lymph nodes. IMPRESSION: 1. Suspicious 1.6 cm mass in the right breast at 9 o'clock. Tissue sampling is indicated. RECOMMENDATION: 1. Ultrasound-guided core needle biopsy the 1.6 cm right breast mass at 9 o'clock. This procedure was scheduled prior to the patient leaving the [facility] Center. I have discussed the findings and recommendations with the patient. If applicable, a reminder  letter will be sent to the patient regarding the next appointment. BI-RADS CATEGORY  4: Suspicious. Electronically Signed   By: Alm Parkins M.D.   On: 05/27/2024 13:58       IMPRESSION/PLAN: 1. Stage IA, [id number] grade 2, ER/PR positive invasive ductal carcinoma of the right breast. Dr. Dewey discusses the pathology findings and reviews the nature of early stage right breast disease.She is scheduled for breast conservation with lumpectomy.  Dr. Lanny anticipates adjuvant antiestrogen therapy to follow. Dr. Dewey also discusses cases in which radiation may be optional for favorable cases based on final pathology provided she takes antiestrogen therapy.  We will follow-up with her final results of surgery, but if recommended Dr. Dewey would anticipate a course of 4 weeks of radiotherapy or possibly an ultrahypofractionated course once weekly for 5 weeks. We discussed the risks, benefits, short, and long term effects of radiotherapy, as well as the curative intent regarding treatment. We will meet back 3-4 weeks after surgery to discuss recommendations.      In a visit lasting 60 minutes, greater than 50% of the time was spent face to face reviewing her case, as well as in preparation of, discussing, and coordinating the patient's care.  The above documentation reflects my direct findings during this shared patient visit. Please see the separate note by Dr. Dewey on this date for the remainder of the patient's plan of  care.    Donald KYM Husband, [facility]    **Disclaimer: This note was dictated with voice recognition software. Similar sounding words can inadvertently be transcribed and this note may contain transcription errors which may not have been corrected upon publication of note.**

## 2024-06-15 ENCOUNTER — Encounter (INDEPENDENT_AMBULATORY_CARE_PROVIDER_SITE_OTHER): Payer: Self-pay | Admitting: Gastroenterology

## 2024-06-20 NOTE — Progress Notes (Signed)
 New Breast Cancer Diagnosis: Right Breast  Patient was noted to have a screening detected possible mass in the right breast.  Diagnostic work up showed the mass in the upper outer quadrant between 9:00 and 10:00.   Histology per Pathology Report: grade 2, Invasive Ductal Carcinoma  Receptor Status: ER(positive), PR (positive), Her2-neu (negative), Ki-(5%)   Surgeon and surgical plan, if any:  Dr. Vanderbilt -Lumpectomy 06/28/2024   Medical oncologist, treatment if any:   Dr. Lanny 06/21/2024 -  Family History of Breast/Ovarian/Prostate Cancer:   Lymphedema issues, if any:  No     Pain issues, if any: Tenderness remains since biopsy.   SAFETY ISSUES: Prior radiation? No Pacemaker/ICD? No Possible current pregnancy? Hysterectomy Is the patient on methotrexate? No  Current Complaints / other details:

## 2024-06-21 ENCOUNTER — Ambulatory Visit
Admission: RE | Admit: 2024-06-21 | Discharge: 2024-06-21 | Disposition: A | Discharge status: Home / Self Care | Source: Ambulatory Visit | Attending: Radiation Oncology | Admitting: Radiation Oncology

## 2024-06-21 ENCOUNTER — Inpatient Hospital Stay: Attending: Hematology | Admitting: Hematology

## 2024-06-21 ENCOUNTER — Encounter: Payer: Self-pay | Admitting: Hematology

## 2024-06-21 ENCOUNTER — Encounter: Payer: Self-pay | Admitting: Radiation Oncology

## 2024-06-21 ENCOUNTER — Inpatient Hospital Stay

## 2024-06-21 VITALS — BP 150/86 | HR 64 | Temp 97.5°F | Resp 19 | Ht 65.0 in | Wt 137.5 lb

## 2024-06-21 DIAGNOSIS — K589 Irritable bowel syndrome without diarrhea: Secondary | ICD-10-CM | POA: Insufficient documentation

## 2024-06-21 DIAGNOSIS — Z1721 Progesterone receptor positive status: Secondary | ICD-10-CM | POA: Insufficient documentation

## 2024-06-21 DIAGNOSIS — Z807 Family history of other malignant neoplasms of lymphoid, hematopoietic and related tissues: Secondary | ICD-10-CM | POA: Diagnosis not present

## 2024-06-21 DIAGNOSIS — E739 Lactose intolerance, unspecified: Secondary | ICD-10-CM | POA: Diagnosis not present

## 2024-06-21 DIAGNOSIS — Z809 Family history of malignant neoplasm, unspecified: Secondary | ICD-10-CM | POA: Insufficient documentation

## 2024-06-21 DIAGNOSIS — Z800 Family history of malignant neoplasm of digestive organs: Secondary | ICD-10-CM | POA: Insufficient documentation

## 2024-06-21 DIAGNOSIS — E119 Type 2 diabetes mellitus without complications: Secondary | ICD-10-CM | POA: Insufficient documentation

## 2024-06-21 DIAGNOSIS — M170 Bilateral primary osteoarthritis of knee: Secondary | ICD-10-CM | POA: Insufficient documentation

## 2024-06-21 DIAGNOSIS — Z79899 Other long term (current) drug therapy: Secondary | ICD-10-CM | POA: Insufficient documentation

## 2024-06-21 DIAGNOSIS — E559 Vitamin D deficiency, unspecified: Secondary | ICD-10-CM | POA: Diagnosis not present

## 2024-06-21 DIAGNOSIS — Z170 Estrogen receptor positive status [ER+]: Secondary | ICD-10-CM | POA: Diagnosis not present

## 2024-06-21 DIAGNOSIS — C50411 Malignant neoplasm of upper-outer quadrant of right female breast: Secondary | ICD-10-CM

## 2024-06-21 DIAGNOSIS — I10 Essential (primary) hypertension: Secondary | ICD-10-CM | POA: Insufficient documentation

## 2024-06-21 DIAGNOSIS — Z1732 Human epidermal growth factor receptor 2 negative status: Secondary | ICD-10-CM | POA: Diagnosis not present

## 2024-06-21 DIAGNOSIS — M47812 Spondylosis without myelopathy or radiculopathy, cervical region: Secondary | ICD-10-CM | POA: Diagnosis not present

## 2024-06-21 DIAGNOSIS — Z8619 Personal history of other infectious and parasitic diseases: Secondary | ICD-10-CM | POA: Insufficient documentation

## 2024-06-21 DIAGNOSIS — Z90710 Acquired absence of both cervix and uterus: Secondary | ICD-10-CM | POA: Insufficient documentation

## 2024-06-21 LAB — CBC WITH DIFFERENTIAL/PLATELET
Abs Immature Granulocytes: 0.04 K/uL (ref 0.00–0.07)
Basophils Absolute: 0.1 K/uL (ref 0.0–0.1)
Basophils Relative: 1 %
Eosinophils Absolute: 0.1 K/uL (ref 0.0–0.5)
Eosinophils Relative: 1 %
HCT: 47.5 % — ABNORMAL HIGH (ref 36.0–46.0)
Hemoglobin: 16 g/dL — ABNORMAL HIGH (ref 12.0–15.0)
Immature Granulocytes: 0 %
Lymphocytes Relative: 32 %
Lymphs Abs: 2.9 K/uL (ref 0.7–4.0)
MCH: 30.5 pg (ref 26.0–34.0)
MCHC: 33.7 g/dL (ref 30.0–36.0)
MCV: 90.5 fL (ref 80.0–100.0)
Monocytes Absolute: 0.6 K/uL (ref 0.1–1.0)
Monocytes Relative: 7 %
Neutro Abs: 5.3 K/uL (ref 1.7–7.7)
Neutrophils Relative %: 59 %
Platelets: 248 K/uL (ref 150–400)
RBC: 5.25 MIL/uL — ABNORMAL HIGH (ref 3.87–5.11)
RDW: 12.6 % (ref 11.5–15.5)
WBC: 8.9 K/uL (ref 4.0–10.5)
nRBC: 0 % (ref 0.0–0.2)

## 2024-06-21 LAB — COMPREHENSIVE METABOLIC PANEL WITH GFR
ALT: 13 U/L (ref 0–44)
AST: 17 U/L (ref 15–41)
Albumin: 4.1 g/dL (ref 3.5–5.0)
Alkaline Phosphatase: 75 U/L (ref 38–126)
Anion gap: 4 — ABNORMAL LOW (ref 5–15)
BUN: 23 mg/dL (ref 8–23)
CO2: 32 mmol/L (ref 22–32)
Calcium: 9.7 mg/dL (ref 8.9–10.3)
Chloride: 104 mmol/L (ref 98–111)
Creatinine, Ser: 0.85 mg/dL (ref 0.44–1.00)
GFR, Estimated: >60 mL/min (ref >60)
Glucose, Bld: 99 mg/dL (ref 70–99)
Potassium: 4.4 mmol/L (ref 3.5–5.1)
Sodium: 140 mmol/L (ref 135–145)
Total Bilirubin: 0.7 mg/dL (ref 0.0–1.2)
Total Protein: 6.7 g/dL (ref 6.5–8.1)

## 2024-06-21 NOTE — Progress Notes (Signed)
 [facility]   [phone] [phone]   Clinic New Consult Note   Patient Care Team: [name], MD as PCP - General (Internal Medicine) Alvan Dorn FALCON, MD as PCP - Cardiology (Cardiology) 06/21/2024  CHIEF COMPLAINTS/PURPOSE OF CONSULTATION:  Newly diagnosed right breast cancer  REFERRING PHYSICIAN: Dr. Vanderbilt  Discussed the use of AI scribe software for clinical note transcription with the patient, who gave verbal consent to proceed.  History of Present Illness Rhonda Andrews is an 83 year old female with newly diagnosed breast cancer who presents for a new consult. She was referred by her surgeon, Dr. Vanderbilt, for a medical oncology consultation.  A mammogram in September 2025 identified a mass in the right breast. A diagnostic mammogram and ultrasound on May 27, 2024, confirmed a 1.6 cm mass at the 9 o'clock position, 4 cm from the nipple, measuring 1.6 x 1.4 x 0.9 cm. 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  She has high blood pressure managed with propranolol  as needed and prediabetes without medication. She has undergone cholecystectomy and hysterectomy. Arthritis affects her neck and knees, but she avoids medication due to a sensitive stomach.  Her family history includes her father with Wegener's granulomatosis, her mother with non-Hodgkin's lymphoma, colon cancer, and an unknown cancer, and maternal grandparents with brain tumors. There is no family history of breast cancer.  She is married, and her husband has Alzheimer's dementia but manages independently. She lives at home, and she has one child and two grandchildren who do not live nearby. She does not consume alcohol or smoke and is supported by friends.     MEDICAL HISTORY:  Past Medical History:  Diagnosis Date   Anxiety    C. 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)    Hypertension    IBS (irritable bowel syndrome)    Lactose intolerance    Palpitation    Palpitations    Vitamin D  deficiency disease 06/23/2019    SURGICAL HISTORY: Past Surgical History:  Procedure Laterality Date   ABDOMINAL HYSTERECTOMY     ovaries remained   ABDOMINAL HYSTERECTOMY     BREAST BIOPSY Right 05/30/2024   US  RT BREAST BX W LOC DEV 1ST LESION IMG BX SPEC US  GUIDE 05/30/2024 [facility] M[facility]MO[facility]   CHOLECYSTECTOMY     COLONOSCOPY  2010   COLONOSCOPY WITH PROPOFOL  N/A 05/21/2018   Procedure: COLONOSCOPY WITH PROPOFOL ;  Surgeon: Avram Lupita BRAVO, MD;  Location: [facility];  Service: Endoscopy;  Laterality: N/A;  with FMT   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   Procedure: FECAL TRANSPLANT;  Surgeon: Avram Lupita BRAVO, MD;  Location: [facility];  Service: Endoscopy;;   KNEE ARTHROSCOPY Right    Precancerous  Tissue - Nose  10/15/2012    SOCIAL HISTORY: Social History   Socioeconomic History   Marital status: Married    Spouse name: Not on file   Number of children: 1   Years of education: Not on file   Highest education level: Some college, no degree  Occupational History   Not on file  Tobacco Use   Smoking status: Never   Smokeless tobacco: Never  Vaping Use   Vaping status: Never Used  Substance and Sexual Activity   Alcohol use: No  Alcohol/week: 0.0 standard drinks of alcohol   Drug use: No   Sexual activity: Not Currently  Other Topics Concern   Not on file  Social History Narrative   Married. Has a daughter. 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   Eats all food groups and has a great appetite.    Walks in house daily in her house, 1.5 miles a day.   Right handed   Quit drinking caffeine 15 years ago   No EtOH/tobacco   Social  Drivers of Corporate investment banker Strain: Not on file  Food Insecurity: No Food Insecurity (06/20/2024)   Hunger Vital Sign    Worried About Running Out of Food in the Last Year: Never true    Ran Out of Food in the Last Year: Never true  Transportation Needs: No Transportation Needs (06/20/2024)   PRAPARE - Administrator, Civil Service (Medical): No    Lack of Transportation (Non-Medical): No  Physical Activity: Inactive (07/17/2017)   Exercise Vital Sign    Days of Exercise per Week: 0 days    Minutes of Exercise per Session: 0 min  Stress: No Stress Concern Present (07/17/2017)   [employer] of Occupational Health - Occupational Stress Questionnaire    Feeling of Stress : Only a little  Social Connections: Socially Integrated (07/17/2017)   Social Connection and Isolation Panel    Frequency of Communication with Friends and Family: More than three times a week    Frequency of Social Gatherings with Friends and Family: More than three times a week    Attends Religious Services: More than 4 times per year    Active Member of [employer] or Organizations: Yes    Attends Engineer, structural: More than 4 times per year    Marital Status: Married  Catering manager Violence: Not At Risk (07/17/2017)   Humiliation, Afraid, Rape, and Kick questionnaire    Fear of Current or Ex-Partner: No    Emotionally Abused: No    Physically Abused: No    Sexually Abused: No    FAMILY HISTORY: Family History  Problem Relation Age of Onset   Lymphoma Mother    Colon cancer Mother    Hypertension Mother    Migraines Mother    Other Father        Wagner's Granulermatosis   Seizures Sister    Brain cancer Maternal Grandmother    Brain cancer Maternal Grandfather    Other Paternal Grandmother        blood clot - DVT following surgery   Alcohol abuse Paternal Grandfather    Cirrhosis Paternal Grandfather    Thyroid  disease Sister    Heart disease Sister      ALLERGIES:  is allergic to hydrocodone and prednisone.  MEDICATIONS:  Current Outpatient Medications  Medication Sig Dispense Refill   Cholecalciferol (VITAMIN D -3) 125 MCG (5000 UT) TABS Take 1 tablet by mouth daily.     famotidine  (PEPCID ) 20 MG tablet Take 1 tablet (20 mg total) by mouth at bedtime. (Patient taking differently: Take 20 mg by mouth as needed.) 90 tablet 3   fluticasone  (FLONASE ) 50 MCG/ACT nasal spray PLACE 2 SPRAYS IN BOTH NOSTRILS DAILY. (Patient taking differently: as needed.) 16 g 3   loperamide  (IMODIUM  A-D) 2 MG tablet Take 0.5 tablets (1 mg total) by mouth 3 (three) times daily as needed for diarrhea or loose stools. Can take before each meal  if needed. (Patient taking differently: Take 1 mg by mouth as needed. Can take before each meal if needed.) 90 tablet 1   meclizine  (ANTIVERT ) 12.5 MG tablet Take 1 tablet (12.5 mg total) by mouth 3 (three) times daily as needed for dizziness. 30 tablet 2   OVER THE COUNTER MEDICATION Essential Enzymes 500 mg 1/2 daily prn     propranolol  (INDERAL ) 60 MG tablet Take 1 tablet (60 mg total) by mouth 2 (two) times daily. 60 tablet 3   No current facility-administered medications for this visit.    REVIEW OF SYSTEMS:   Constitutional: Denies fevers, chills or abnormal night sweats Eyes: Denies blurriness of vision, double vision or watery eyes Ears, nose, mouth, throat, and face: Denies mucositis or sore throat Respiratory: Denies cough, dyspnea or wheezes Cardiovascular: Denies palpitation, chest discomfort or lower extremity swelling Gastrointestinal:  Denies nausea, heartburn or change in bowel habits Skin: Denies abnormal skin rashes Lymphatics: Denies new lymphadenopathy or easy bruising Neurological:Denies numbness, tingling or new weaknesses Behavioral/Psych: Mood is stable, no new changes  All other systems were reviewed with the patient and are negative.  PHYSICAL EXAMINATION: ECOG PERFORMANCE STATUS: 1 -  Symptomatic but completely ambulatory  Vitals:   06/21/24 1100 06/21/24 1122  BP: (!) 160/82 (!) 150/86  Pulse: 64   Resp: 19   Temp: (!) 97.5 F (36.4 C)   SpO2: 99%    Filed Weights   06/21/24 1100  Weight: 137 lb 8 oz (62.4 kg)    GENERAL:alert, no distress and comfortable SKIN: skin color, texture, turgor are normal, no rashes or significant lesions EYES: normal, conjunctiva are pink and non-injected, sclera clear OROPHARYNX:no exudate, no erythema and lips, buccal mucosa, and tongue normal  NECK: supple, thyroid  normal size, non-tender, without nodularity LYMPH:  no palpable lymphadenopathy in the cervical, axillary or inguinal LUNGS: clear to auscultation and percussion with normal breathing effort HEART: regular rate & rhythm and no murmurs and no lower extremity edema ABDOMEN:abdomen soft, non-tender and normal bowel sounds Musculoskeletal:no cyanosis of digits and no clubbing  PSYCH: alert & oriented x 3 with fluent speech NEURO: no focal motor/sensory deficits  Physical Exam NECK: No cervical lymphadenopathy. BREAST: Right breast mass in upper quadrant, approximately 2 cm.  LABORATORY DATA:  I have reviewed the data as listed    Latest Ref Rng & Units 06/21/2024   12:09 PM 12/18/2023    2:40 AM 05/17/2020    6:16 PM  CBC  WBC 4.0 - 10.5 K/uL 8.9  7.2  9.2   Hemoglobin 12.0 - 15.0 g/dL 83.9  84.3  84.1   Hematocrit 36.0 - 46.0 % 47.5  48.4  49.4   Platelets 150 - 400 K/uL 248  251  275     @cmpl @  RADIOGRAPHIC STUDIES: I have personally reviewed the radiological images as listed and agreed with the findings in the report. US  RT BREAST BX W LOC DEV 1ST LESION IMG BX SPEC US  GUIDE Addendum Date: 06/01/2024 ADDENDUM REPORT: 06/01/2024 10:49 ADDENDUM: PATHOLOGY revealed: 1. Breast, right, needle core biopsy, 9:00; 4 cmfn (heart clip)- INVASIVE MODERATELY DIFFERENTIATED DUCTAL ADENOCARCINOMA WITH EXTENSIVE EXTRACELLULAR MUCIN, GRADE 2 (3+2+1)- FOCAL DUCTAL  CARCINOMA IN SITU, INTERMEDIATE NUCLEAR GRADE, SOLID TYPE INVOLVING A PAPILLOMA- OVERALL GRADE: GRADE 2 (6/9)- NEGATIVE FOR ANGIOLYMPHATIC INVASION - TUMOR MEASURES 12 MM IN GREATEST LINEAR EXTENT Pathology results are CONCORDANT with imaging findings, per Dr. Alm Parkins. Pathology results and recommendations were discussed with patient via telephone on 05/31/24 by Rock Hover  RN. Patient reported biopsy site doing well with no adverse symptoms, and only slight tenderness at the site. Post biopsy care instructions were reviewed, questions were answered and my direct phone number was provided. Patient was instructed to call [facility] for any additional questions or concerns related to biopsy site. RECOMMENDATION: Surgical and oncological consultation. Request for surgical consultation relayed to [facility] and Olam Bunnell at [facility] on 05/31/2024 by Rock Hover RN. Pathology results reported by Rock Hover RN on 05/31/2024. Electronically Signed   By: Alm Parkins M.D.   On: 06/01/2024 10:49   Result Date: 06/01/2024 CLINICAL DATA:  Patient presents for ultrasound-guided core needle biopsy of a right breast mass. EXAM: ULTRASOUND GUIDED RIGHT BREAST CORE NEEDLE BIOPSY COMPARISON:  Previous exam(s). PROCEDURE: I met with the patient and we discussed the procedure of ultrasound-guided biopsy, including benefits and alternatives. We discussed the high likelihood of a successful procedure. We discussed the risks of the procedure, including infection, bleeding, tissue injury, clip migration, and inadequate sampling. Informed written consent was given. The usual time-out protocol was performed immediately prior to the procedure. Lesion quadrant: Upper outer quadrant: Near 9 o'clock, 4 cm from the nipple. Using sterile technique and 1% Lidocaine  as local anesthetic, under direct ultrasound visualization, a 12 gauge spring-loaded device was used to perform biopsy of the mass in the  right breast at 9 o'clock using an inferior approach. At the conclusion of the procedure a heart shaped tissue marker clip was deployed into the biopsy cavity. Follow up 2 view mammogram was performed and dictated separately. IMPRESSION: Ultrasound guided biopsy of a right breast mass. No apparent complications. Electronically Signed: By: Alm Parkins M.D. On: 05/30/2024 13:12   MM CLIP PLACEMENT RIGHT Result Date: 05/30/2024 CLINICAL DATA:  Assess post biopsy marker clip placement following ultrasound-guided core needle biopsy of a right breast mass. EXAM: 3D DIAGNOSTIC RIGHT MAMMOGRAM POST ULTRASOUND BIOPSY COMPARISON:  Previous exam(s). ACR Breast Density Category b: There are scattered areas of fibroglandular density. FINDINGS: 3D Mammographic images were obtained following ultrasound guided biopsy of a right breast mass. The biopsy marking clip is in expected position at the site of biopsy. IMPRESSION: Appropriate positioning of the heart shaped biopsy marking clip at the site of biopsy in the mass in the lateral right breast. Final Assessment: Post Procedure Mammograms for Marker Placement Electronically Signed   By: Alm Parkins M.D.   On: 05/30/2024 13:21   MM 3D DIAGNOSTIC MAMMOGRAM UNILATERAL RIGHT BREAST Result Date: 05/27/2024 CLINICAL DATA:  Recall from screening for a possible right breast mass. EXAM: DIGITAL DIAGNOSTIC UNILATERAL RIGHT MAMMOGRAM WITH TOMOSYNTHESIS AND CAD; ULTRASOUND RIGHT BREAST LIMITED TECHNIQUE: Right digital diagnostic mammography and breast tomosynthesis was performed. The images were evaluated with computer-aided detection. ; Targeted ultrasound examination of the right breast was performed COMPARISON:  Previous exam(s). ACR Breast Density Category b: There are scattered areas of fibroglandular density. FINDINGS: On spot compression imaging, the possible mass noted on the current screening exam persists as a rounded, 1.4 cm mass with well-defined, but mildly lobulated  margins. It lies in the upper outer breast between 9 and 10 o'clock. On physical exam, there is a firm mass in the right breast at 9 o'clock with an overlying focal area of skin elevation and discoloration. Targeted right breast ultrasound is performed, showing an oval hypoechoic mass with partly circumscribed partly lobulated margins in the right breast at 9 o'clock, 4 cm the nipple, measuring 1.6 x 0.9 x  1.4 cm. There is internal blood flow on color Doppler analysis. A tract of hypoechogenicity extends from the mass to the skin. Sonographic evaluation of the right axilla demonstrates normal lymph nodes. No enlarged or abnormal lymph nodes. IMPRESSION: 1. Suspicious 1.6 cm mass in the right breast at 9 o'clock. Tissue sampling is indicated. RECOMMENDATION: 1. Ultrasound-guided core needle biopsy the 1.6 cm right breast mass at 9 o'clock. This procedure was scheduled prior to the patient leaving the Breast Center. I have discussed the findings and recommendations with the patient. If applicable, a reminder letter will be sent to the patient regarding the next appointment. BI-RADS CATEGORY  4: Suspicious. Electronically Signed   By: Alm Parkins M.D.   On: 05/27/2024 13:58   US  LIMITED ULTRASOUND INCLUDING AXILLA RIGHT BREAST Result Date: 05/27/2024 CLINICAL DATA:  Recall from screening for a possible right breast mass. EXAM: DIGITAL DIAGNOSTIC UNILATERAL RIGHT MAMMOGRAM WITH TOMOSYNTHESIS AND CAD; ULTRASOUND RIGHT BREAST LIMITED TECHNIQUE: Right digital diagnostic mammography and breast tomosynthesis was performed. The images were evaluated with computer-aided detection. ; Targeted ultrasound examination of the right breast was performed COMPARISON:  Previous exam(s). ACR Breast Density Category b: There are scattered areas of fibroglandular density. FINDINGS: On spot compression imaging, the possible mass noted on the current screening exam persists as a rounded, 1.4 cm mass with well-defined, but mildly  lobulated margins. It lies in the upper outer breast between 9 and 10 o'clock. On physical exam, there is a firm mass in the right breast at 9 o'clock with an overlying focal area of skin elevation and discoloration. Targeted right breast ultrasound is performed, showing an oval hypoechoic mass with partly circumscribed partly lobulated margins in the right breast at 9 o'clock, 4 cm the nipple, measuring 1.6 x 0.9 x 1.4 cm. There is internal blood flow on color Doppler analysis. A tract of hypoechogenicity extends from the mass to the skin. Sonographic evaluation of the right axilla demonstrates normal lymph nodes. No enlarged or abnormal lymph nodes. IMPRESSION: 1. Suspicious 1.6 cm mass in the right breast at 9 o'clock. Tissue sampling is indicated. RECOMMENDATION: 1. Ultrasound-guided core needle biopsy the 1.6 cm right breast mass at 9 o'clock. This procedure was scheduled prior to the patient leaving the Breast Center. I have discussed the findings and recommendations with the patient. If applicable, a reminder letter will be sent to the patient regarding the next appointment. BI-RADS CATEGORY  4: Suspicious. Electronically Signed   By: Alm Parkins M.D.   On: 05/27/2024 13:58   Assessment & Plan Malignant neoplasm of upper-outer quadrant of right breast, invasive ductal carcinoma of right breast, ER+/PR+, HER2-, G2 -Diagnosed in September 2025, discovered on screening mammogram.   --stage I invasive ductal carcinoma of the right breast, ER+/PR+, HER2-. Tumor size is 1.6 cm, located at the 9 o'clock position, 4 cm from the nipple. Grade 2, indicating intermediate aggressiveness. Favorable prognosis due to hormone receptor positivity and HER2 negativity. No lymph node involvement by US  - Proceed with scheduled surgery for lumpectomy next week, sentinel lymph node biopsy is not needed. -We discussed her risk of distant recurrence after surgery, which is likely to be low.  Recommend antiestrogen therapy  with tamoxifen post-surgery to reduce recurrence risk, considering its lower risk of exacerbating joint pain and anticipated tolerability given her arthritis and sensitive stomach.  She may not tolerate aromatase inhibitor due to her pre-existing severe osteoarthritis -The potential side effects, which includes but not limited to, hot flash, skin and vaginal dryness,  slightly increased risk of cardiovascular disease and cataract, small risk of thrombosis and endometrial cancer, were discussed with her in great details.  She has a had a hysterectomy so endometrial cancer risk is not a concern.  DVT preventive strategies for thrombosis, such as being physically active, using compression stocks, avoid cigarette smoking, etc., were reviewed with her. I also recommend her to follow-up with her gynecologist once a year, and watch for vaginal spotting or bleeding, as a clinically sign of endometrial cancer, etc. She voiced good understanding, and agrees to proceed. Will start after she completes adjuvant breast radiation. -We also reviewed the breast cancer surveillance, including annual mammogram, and physical exam.  - Consult with radiation oncologist Dr. Dewey to discuss potential radiation therapy. - Schedule follow-up appointment one month after surgery if no radiation is pursued, or after completion of radiation if pursued.  Arthralgia involving multiple joints Chronic arthralgia affecting multiple joints, including neck and knees. Concerns about potential exacerbation of joint pain with antiestrogen therapy. - Recommend tamoxifen post-surgery, considering its lower risk of exacerbating joint pain.  Plan - I reviewed her images, biopsy results - She will proceed with right lumpectomy with Dr. Vanderbilt next week - She will meet radiation oncologist Dr. Dewey today to discuss adjuvant radiation - I recommend adjuvant tamoxifen after surgery, or after radiation if that is recommended.   Orders Placed  This Encounter  Procedures   CBC with Differential/Platelet    Standing Status:   Standing    Number of Occurrences:   50    Expiration Date:   06/21/2025   Comprehensive metabolic panel with GFR    Standing Status:   Standing    Number of Occurrences:   50    Expiration Date:   06/21/2025    All questions were answered. The patient knows to call the clinic with any problems, questions or concerns. I spent 40 minutes counseling the patient face to face. The total time spent in the appointment was 45 minutes including review of chart and various tests results, discussions about plan of care and coordination of care plan.     Onita Mattock, MD 06/21/2024 2:39 PM  \

## 2024-06-22 ENCOUNTER — Other Ambulatory Visit: Payer: Self-pay

## 2024-06-22 ENCOUNTER — Telehealth: Payer: Self-pay | Admitting: *Deleted

## 2024-06-22 ENCOUNTER — Encounter (HOSPITAL_BASED_OUTPATIENT_CLINIC_OR_DEPARTMENT_OTHER): Payer: Self-pay | Admitting: Surgery

## 2024-06-22 ENCOUNTER — Encounter: Payer: Self-pay | Admitting: *Deleted

## 2024-06-22 DIAGNOSIS — C50411 Malignant neoplasm of upper-outer quadrant of right female breast: Secondary | ICD-10-CM

## 2024-06-22 NOTE — Telephone Encounter (Signed)
 Spoke with patient to follow up from new patient appt and assess navigation needs. Patient denies any questions or concerns at this time.  Contact information given via email per pt.

## 2024-06-23 ENCOUNTER — Other Ambulatory Visit: Payer: Self-pay | Admitting: Surgery

## 2024-06-23 DIAGNOSIS — C50911 Malignant neoplasm of unspecified site of right female breast: Secondary | ICD-10-CM

## 2024-06-24 ENCOUNTER — Ambulatory Visit
Admission: RE | Admit: 2024-06-24 | Discharge: 2024-06-24 | Disposition: A | Discharge status: Home / Self Care | Source: Ambulatory Visit | Attending: Surgery | Admitting: Surgery

## 2024-06-24 DIAGNOSIS — C50911 Malignant neoplasm of unspecified site of right female breast: Secondary | ICD-10-CM

## 2024-06-24 DIAGNOSIS — N6315 Unspecified lump in the right breast, overlapping quadrants: Secondary | ICD-10-CM | POA: Diagnosis not present

## 2024-06-24 HISTORY — PX: BREAST BIOPSY: SHX20

## 2024-06-26 ENCOUNTER — Telehealth: Payer: Self-pay | Admitting: Internal Medicine

## 2024-06-26 NOTE — Telephone Encounter (Signed)
 Patient called this evening complaining of worsening of chronic watery nonbloody  diarrhea.  Chart reviewed.  States her IBS is flaring.  She is stressed out taking care of her husband with Alzheimer's.  She had a radiation implant to her breast last week planning to have a resection on Tuesday; does not want to have her bowels all messed up for that procedure.  Typically takes Imodium ; has taken 2 Imodium  today.  No fever no abdominal pain.  I advised her to increase Imodium  to up to 4 tablets/day.  I will send this message on to her GI care team.  I suggest to the patient she might arrange follow-up with us  earlier than scheduled.

## 2024-06-27 NOTE — Telephone Encounter (Signed)
 Thanks Garrel.  Hi Devere, can you please schedule a follow up appointment for this patient in next available with me or Charmaine?  Thanks,  Toribio Fortune, MD Gastroenterology and Hepatology [facility]

## 2024-06-28 ENCOUNTER — Ambulatory Visit (HOSPITAL_BASED_OUTPATIENT_CLINIC_OR_DEPARTMENT_OTHER)
Admission: RE | Admit: 2024-06-28 | Discharge: 2024-06-28 | Disposition: A | Discharge status: Home / Self Care | Attending: Surgery | Admitting: Surgery

## 2024-06-28 ENCOUNTER — Other Ambulatory Visit: Payer: Self-pay

## 2024-06-28 ENCOUNTER — Encounter (HOSPITAL_BASED_OUTPATIENT_CLINIC_OR_DEPARTMENT_OTHER)
Admission: RE | Disposition: A | Discharge status: Home / Self Care | Payer: Self-pay | Source: Home / Self Care | Attending: Surgery

## 2024-06-28 ENCOUNTER — Ambulatory Visit (HOSPITAL_BASED_OUTPATIENT_CLINIC_OR_DEPARTMENT_OTHER): Admitting: Anesthesiology

## 2024-06-28 ENCOUNTER — Ambulatory Visit
Admission: RE | Admit: 2024-06-28 | Discharge: 2024-06-28 | Disposition: A | Discharge status: Home / Self Care | Source: Ambulatory Visit | Attending: Surgery | Admitting: Surgery

## 2024-06-28 ENCOUNTER — Encounter (HOSPITAL_BASED_OUTPATIENT_CLINIC_OR_DEPARTMENT_OTHER): Payer: Self-pay | Admitting: Surgery

## 2024-06-28 DIAGNOSIS — I10 Essential (primary) hypertension: Secondary | ICD-10-CM

## 2024-06-28 DIAGNOSIS — Z01818 Encounter for other preprocedural examination: Secondary | ICD-10-CM

## 2024-06-28 DIAGNOSIS — K219 Gastro-esophageal reflux disease without esophagitis: Secondary | ICD-10-CM | POA: Diagnosis not present

## 2024-06-28 DIAGNOSIS — C50411 Malignant neoplasm of upper-outer quadrant of right female breast: Secondary | ICD-10-CM | POA: Diagnosis not present

## 2024-06-28 DIAGNOSIS — C50911 Malignant neoplasm of unspecified site of right female breast: Secondary | ICD-10-CM

## 2024-06-28 DIAGNOSIS — Z1721 Progesterone receptor positive status: Secondary | ICD-10-CM | POA: Diagnosis not present

## 2024-06-28 DIAGNOSIS — Z170 Estrogen receptor positive status [ER+]: Secondary | ICD-10-CM | POA: Diagnosis not present

## 2024-06-28 DIAGNOSIS — Z1732 Human epidermal growth factor receptor 2 negative status: Secondary | ICD-10-CM | POA: Diagnosis not present

## 2024-06-28 HISTORY — DX: Gastro-esophageal reflux disease without esophagitis: K21.9

## 2024-06-28 HISTORY — DX: Prediabetes: R73.03

## 2024-06-28 HISTORY — PX: BREAST LUMPECTOMY WITH RADIOACTIVE SEED LOCALIZATION: SHX6424

## 2024-06-28 SURGERY — BREAST LUMPECTOMY WITH RADIOACTIVE SEED LOCALIZATION
Anesthesia: General | Site: Breast | Laterality: Right

## 2024-06-28 MED ORDER — ONDANSETRON HCL 4 MG/2ML IJ SOLN
INTRAMUSCULAR | Status: DC | PRN
Start: 2024-06-28 — End: 2024-06-28
  Administered 2024-06-28: 4 mg via INTRAVENOUS

## 2024-06-28 MED ORDER — EPHEDRINE SULFATE (PRESSORS) 25 MG/5ML IV SOSY
PREFILLED_SYRINGE | INTRAVENOUS | Status: DC | PRN
  Administered 2024-06-28: 10 mg via INTRAVENOUS
  Administered 2024-06-28 (×3): 5 mg via INTRAVENOUS

## 2024-06-28 MED ORDER — CEFAZOLIN SODIUM-DEXTROSE 3-4 GM/150ML-% IV SOLN
3.0000 g | INTRAVENOUS | Status: AC
Start: 2024-06-28 — End: 2024-06-28
  Administered 2024-06-28: 2 g via INTRAVENOUS

## 2024-06-28 MED ORDER — CEFAZOLIN SODIUM-DEXTROSE 2-4 GM/100ML-% IV SOLN
INTRAVENOUS | Status: AC
  Filled 2024-06-28: qty 100

## 2024-06-28 MED ORDER — LIDOCAINE 2% (20 MG/ML) 5 ML SYRINGE
INTRAMUSCULAR | Status: AC
  Filled 2024-06-28: qty 5

## 2024-06-28 MED ORDER — PROPOFOL 10 MG/ML IV BOLUS
INTRAVENOUS | Status: DC | PRN
  Administered 2024-06-28: 150 mg via INTRAVENOUS

## 2024-06-28 MED ORDER — OXYCODONE HCL 5 MG PO TABS: 5.0000 mg | ORAL_TABLET | Freq: Four times a day (QID) | ORAL | 0 refills | Status: DC | PRN

## 2024-06-28 MED ORDER — ONDANSETRON HCL 4 MG/2ML IJ SOLN
INTRAMUSCULAR | Status: AC
  Filled 2024-06-28: qty 2

## 2024-06-28 MED ORDER — GLYCOPYRROLATE 0.2 MG/ML IJ SOLN
INTRAMUSCULAR | Status: DC | PRN
Start: 2024-06-28 — End: 2024-06-28
  Administered 2024-06-28: .1 mg via INTRAVENOUS

## 2024-06-28 MED ORDER — LIDOCAINE HCL (CARDIAC) PF 100 MG/5ML IV SOSY
PREFILLED_SYRINGE | INTRAVENOUS | Status: DC | PRN
  Administered 2024-06-28: 20 mg via INTRAVENOUS

## 2024-06-28 MED ORDER — 0.9 % SODIUM CHLORIDE (POUR BTL) OPTIME
TOPICAL | Status: DC | PRN
  Administered 2024-06-28: 1000 mL

## 2024-06-28 MED ORDER — CHLORHEXIDINE GLUCONATE CLOTH 2 % EX PADS: 6.0000 | MEDICATED_PAD | Freq: Once | CUTANEOUS | Status: DC

## 2024-06-28 MED ORDER — BUPIVACAINE-EPINEPHRINE (PF) 0.25% -1:200000 IJ SOLN
INTRAMUSCULAR | Status: DC | PRN
  Administered 2024-06-28: 20 mL

## 2024-06-28 MED ORDER — LACTATED RINGERS IV SOLN: INTRAVENOUS | Status: DC

## 2024-06-28 MED ORDER — FENTANYL CITRATE (PF) 100 MCG/2ML IJ SOLN
INTRAMUSCULAR | Status: AC
  Filled 2024-06-28: qty 2

## 2024-06-28 MED ORDER — AMISULPRIDE (ANTIEMETIC) 5 MG/2ML IV SOLN: 10.0000 mg | Freq: Once | INTRAVENOUS | Status: DC | PRN

## 2024-06-28 MED ORDER — PHENYLEPHRINE HCL (PRESSORS) 10 MG/ML IV SOLN
INTRAVENOUS | Status: DC | PRN
  Administered 2024-06-28 (×3): 40 ug via INTRAVENOUS

## 2024-06-28 MED ORDER — ACETAMINOPHEN 500 MG PO TABS: 1000.0000 mg | ORAL_TABLET | ORAL | Status: DC

## 2024-06-28 MED ORDER — FENTANYL CITRATE (PF) 100 MCG/2ML IJ SOLN: 25.0000 ug | INTRAMUSCULAR | Status: DC | PRN

## 2024-06-28 MED ORDER — FENTANYL CITRATE (PF) 100 MCG/2ML IJ SOLN
INTRAMUSCULAR | Status: DC | PRN
  Administered 2024-06-28 (×2): 25 ug via INTRAVENOUS

## 2024-06-28 MED ORDER — ACETAMINOPHEN 500 MG PO TABS
ORAL_TABLET | ORAL | Status: AC
  Filled 2024-06-28: qty 2

## 2024-06-28 SURGICAL SUPPLY — 41 items
BINDER BREAST LRG (GAUZE/BANDAGES/DRESSINGS) IMPLANT
BINDER BREAST MEDIUM (GAUZE/BANDAGES/DRESSINGS) IMPLANT
BINDER BREAST XLRG (GAUZE/BANDAGES/DRESSINGS) IMPLANT
BINDER BREAST XXLRG (GAUZE/BANDAGES/DRESSINGS) IMPLANT
BLADE SURG 15 STRL LF DISP TIS (BLADE) ×1 IMPLANT
CANISTER SUC SOCK COL 7IN (MISCELLANEOUS) IMPLANT
CANISTER SUCT 1200ML W/VALVE (MISCELLANEOUS) IMPLANT
CHLORAPREP W/TINT 26 (MISCELLANEOUS) ×1 IMPLANT
CLIP APPLIE 9.375 MED OPEN (MISCELLANEOUS) IMPLANT
COVER BACK TABLE 60X90IN (DRAPES) ×1 IMPLANT
COVER MAYO STAND STRL (DRAPES) ×1 IMPLANT
COVER PROBE CYLINDRICAL 5X96 (MISCELLANEOUS) ×1 IMPLANT
DERMABOND ADVANCED .7 DNX12 (GAUZE/BANDAGES/DRESSINGS) ×1 IMPLANT
DRAPE LAPAROSCOPIC ABDOMINAL (DRAPES) IMPLANT
DRAPE LAPAROTOMY 100X72 PEDS (DRAPES) ×1 IMPLANT
DRAPE UTILITY XL STRL (DRAPES) ×1 IMPLANT
ELECT COATED BLADE 2.86 ST (ELECTRODE) ×1 IMPLANT
ELECTRODE REM PT RTRN 9FT ADLT (ELECTROSURGICAL) ×1 IMPLANT
GLOVE BIOGEL PI IND STRL 8 (GLOVE) ×1 IMPLANT
GLOVE ECLIPSE 8.0 STRL XLNG CF (GLOVE) ×1 IMPLANT
GOWN STRL REUS W/ TWL LRG LVL3 (GOWN DISPOSABLE) ×2 IMPLANT
GOWN STRL REUS W/ TWL XL LVL3 (GOWN DISPOSABLE) ×1 IMPLANT
HEMOSTAT ARISTA ABSORB 3G PWDR (HEMOSTASIS) IMPLANT
HEMOSTAT SNOW SURGICEL 2X4 (HEMOSTASIS) IMPLANT
KIT MARKER MARGIN INK (KITS) ×1 IMPLANT
NDL HYPO 25X1 1.5 SAFETY (NEEDLE) ×1 IMPLANT
NEEDLE HYPO 25X1 1.5 SAFETY (NEEDLE) ×1 IMPLANT
NS IRRIG 1000ML POUR BTL (IV SOLUTION) ×1 IMPLANT
PACK BASIN DAY SURGERY FS (CUSTOM PROCEDURE TRAY) ×1 IMPLANT
PENCIL SMOKE EVACUATOR (MISCELLANEOUS) ×1 IMPLANT
SLEEVE SCD COMPRESS KNEE MED (STOCKING) ×1 IMPLANT
SPIKE FLUID TRANSFER (MISCELLANEOUS) IMPLANT
SPONGE T-LAP 4X18 ~~LOC~~+RFID (SPONGE) ×1 IMPLANT
SUT MNCRL AB 4-0 PS2 18 (SUTURE) ×1 IMPLANT
SUT SILK 2 0 SH (SUTURE) IMPLANT
SUT VICRYL 3-0 CR8 SH (SUTURE) ×1 IMPLANT
SYR CONTROL 10ML LL (SYRINGE) ×1 IMPLANT
TOWEL GREEN STERILE FF (TOWEL DISPOSABLE) ×1 IMPLANT
TRAY FAXITRON CT DISP (TRAY / TRAY PROCEDURE) ×1 IMPLANT
TUBE CONNECTING 20X1/4 (TUBING) IMPLANT
YANKAUER SUCT BULB TIP NO VENT (SUCTIONS) IMPLANT

## 2024-06-28 NOTE — Anesthesia Preprocedure Evaluation (Signed)
 Anesthesia Evaluation  Patient identified by MRN, date of birth, ID band Patient awake    Reviewed: Allergy & Precautions, NPO status , Patient's Chart, lab work & pertinent test results  Airway Mallampati: II  TM Distance: >3 FB Neck ROM: Full    Dental  (+) Dental Advisory Given   Pulmonary neg pulmonary ROS   breath sounds clear to auscultation       Cardiovascular hypertension, Pt. on home beta blockers  Rhythm:Regular Rate:Normal     Neuro/Psych  Headaches    GI/Hepatic Neg liver ROS,GERD  ,,  Endo/Other  negative endocrine ROS    Renal/GU negative Renal ROS     Musculoskeletal   Abdominal   Peds  Hematology negative hematology ROS (+)   Anesthesia Other Findings   Reproductive/Obstetrics                              Anesthesia Physical Anesthesia Plan  ASA: 3  Anesthesia Plan: General   Post-op Pain Management: Tylenol  PO (pre-op)*   Induction: Intravenous  PONV Risk Score and Plan: 3 and Dexamethasone, Ondansetron  and Treatment may vary due to age or medical condition  Airway Management Planned: LMA  Additional Equipment:   Intra-op Plan:   Post-operative Plan: Extubation in OR  Informed Consent: I have reviewed the patients History and Physical, chart, labs and discussed the procedure including the risks, benefits and alternatives for the proposed anesthesia with the patient or authorized representative who has indicated his/her understanding and acceptance.     Dental advisory given  Plan Discussed with: CRNA  Anesthesia Plan Comments:         Anesthesia Quick Evaluation

## 2024-06-28 NOTE — Anesthesia Procedure Notes (Signed)
 Procedure Name: LMA Insertion Date/Time: 06/28/2024 2:37 PM  Performed by: Debarah Chiquita LABOR, CRNAPre-anesthesia Checklist: Patient identified, Emergency Drugs available, Suction available and Patient being monitored Patient Re-evaluated:Patient Re-evaluated prior to induction Oxygen Delivery Method: Circle system utilized Preoxygenation: Pre-oxygenation with 100% oxygen Induction Type: IV induction Ventilation: Mask ventilation without difficulty LMA: LMA inserted LMA Size: 3.0 Number of attempts: 1 Airway Equipment and Method: Bite block Placement Confirmation: positive ETCO2 Tube secured with: Tape Dental Injury: Teeth and Oropharynx as per pre-operative assessment

## 2024-06-28 NOTE — H&P (Signed)
 History of Present Illness: Rhonda Andrews is a 83 y.o. female who is seen today as an office consultation for evaluation of New Consultation (- Rt breast cancer)  Patient presents for evaluation of newly diagnosed right breast mass upper outer quadrant. 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 He has 1.8 cm location right upper outer outer quadrant right breast. No history of breast pain, breast mass or nipple discharge. This was detected with screening mammography.  Review of Systems: A complete review of systems was obtained from the patient. I have reviewed this information and discussed as appropriate with the patient. See HPI as well for other ROS.    Medical History: Past Medical History:  Diagnosis Date  Arthritis  Asthma, unspecified asthma severity, unspecified whether complicated, unspecified whether persistent (HHS-HCC)  History of cancer  Hypertension   There is no problem list on file for this patient.  Past Surgical History:  Procedure Laterality Date  CHOLECYSTECTOMY  HYSTERECTOMY    Allergies  Allergen Reactions  Hydrocodone Nausea And Vomiting  Prednisone Other (See Comments) and Palpitations  Heart races   Current Outpatient Medications on File Prior to Visit  Medication Sig Dispense Refill  fluticasone  propionate (FLONASE ) 50 mcg/actuation nasal spray  propranoloL  (INDERAL ) 60 MG tablet Take 60 mg by mouth 2 (two) times daily   No current facility-administered medications on file prior to visit.   Family History  Problem Relation Age of Onset  High blood pressure (Hypertension) Mother  Deep vein thrombosis (DVT or abnormal blood clot formation) Mother  Stroke Father    Social History   Tobacco Use  Smoking Status Never  Smokeless Tobacco Never    Social History   Socioeconomic History  Marital status: Married  Tobacco Use  Smoking status: Never  Smokeless  tobacco: Never  Vaping Use  Vaping status: Unknown  Substance and Sexual Activity  Alcohol use: Never  Drug use: Never   Social Drivers of Health   Physical Activity: Inactive (07/17/2017)  Received from [facility]  Exercise Vital Sign  Days of Exercise per Week: 0 days  Minutes of Exercise per Session: 0 min  Stress: No Stress Concern Present (07/17/2017)  Received from [facility] - Occupational Stress Questionnaire  Feeling of Stress : Only a little  Social Connections: Socially Integrated (07/17/2017)  Received from [facility]  Social Connection and Isolation Panel  Frequency of Communication with Friends and Family: More than three times a week  Frequency of Social Gatherings with Friends and Family: More than three times a week  Attends Religious Services: More than 4 times per year  Active Member of [employer] or Organizations: Yes  Attends Banker Meetings: More than 4 times per year  Marital Status: Married  Housing Stability: Unknown (06/02/2024)  Housing Stability Vital Sign  Homeless in the Last Year: No   Objective:   Vitals:  06/02/24 1458  BP: (!) 174/82  Pulse: 82  Temp: 36.8 C (98.2 F)  SpO2: 98%  Weight: 61.5 kg (135 lb 9.6 oz)  Height: 165.1 cm (5' 5)  PainSc: 0-No pain   Body mass index is 22.57 kg/m.  Physical Exam Exam conducted with a chaperone present (brooks).  Cardiovascular:  Rate and Rhythm: Normal rate.  Pulmonary:  Effort: Pulmonary effort is normal.  Chest:  Breasts: Right: No mass or tenderness.  Left: No mass or tenderness.   Comments: Bruising  noted right breast. Musculoskeletal:  General: Normal range of motion.  Cervical back: Normal range of motion.  Lymphadenopathy:  Upper Body:  Right upper body: No axillary adenopathy.  Left upper body: No axillary adenopathy.  Neurological:  General: No focal deficit present.  Mental Status: She is alert.     Labs,  Imaging and Diagnostic Testing: ecall from screening for a possible right breast mass.  EXAM: DIGITAL DIAGNOSTIC UNILATERAL RIGHT MAMMOGRAM WITH TOMOSYNTHESIS AND CAD; ULTRASOUND RIGHT BREAST LIMITED  TECHNIQUE: Right digital diagnostic mammography and breast tomosynthesis was performed. The images were evaluated with computer-aided detection. ; Targeted ultrasound examination of the right breast was performed  COMPARISON: Previous exam(s).  ACR Breast Density Category b: There are scattered areas of fibroglandular density.  FINDINGS: On spot compression imaging, the possible mass noted on the current screening exam persists as a rounded, 1.4 cm mass with well-defined, but mildly lobulated margins. It lies in the upper outer breast between 9 and 10 o'clock.  On physical exam, there is a firm mass in the right breast at 9 o'clock with an overlying focal area of skin elevation and discoloration.  Targeted right breast ultrasound is performed, showing an oval hypoechoic mass with partly circumscribed partly lobulated margins in the right breast at 9 o'clock, 4 cm the nipple, measuring 1.6 x 0.9 x 1.4 cm. There is internal blood flow on color Doppler analysis. A tract of hypoechogenicity extends from the mass to the skin.  Sonographic evaluation of the right axilla demonstrates normal lymph nodes. No enlarged or abnormal lymph nodes.  IMPRESSION: 1. Suspicious 1.6 cm mass in the right breast at 9 o'clock. Tissue sampling is indicated.  RECOMMENDATION: 1. Ultrasound-guided core needle biopsy the 1.6 cm right breast mass at 9 o'clock. This procedure was scheduled prior to the patient leaving the [facility] Center.  I have discussed the findings and recommendations with the patient. If applicable, a reminder letter will be sent to the patient regarding the next appointment.  BI-RADS CATEGORY 4: Suspicious.   Electronically Signed By: Alm Parkins M.D. On: 05/27/2024  13:58  FINAL DIAGNOSIS  1. Breast, right, needle core biopsy, 9:00 4cmfn (heart clip) : INVASIVE MODERATELY DIFFERENTIATED DUCTAL ADENOCARCINOMA WITH EXTENSIVE EXTRACELLULAR MUCIN, GRADE 2 (3+2+1) FOCAL DUCTAL CARCINOMA IN SITU, INTERMEDIATE NUCLEAR GRADE, SOLID TYPE INVOLVING A PAPILLOMA TUBULE FORMATION: SCORE 3 NUCLEAR PLEOMORPHISM: SCORE 2 MITOTIC COUNT: SCORE 1 TOTAL SCORE: 6 OVERALL GRADE: GRADE 2 (6/9) NEGATIVE FOR ANGIOLYMPHATIC INVASION TUMOR MEASURES 12 MM IN GREATEST LINEAR EXTENT  Diagnosis Note : Immunohistochemical stains for breast prognostic markers have been ordered, and these results will be issued within a subsequent addendum to this report. Case reviewed by Dr. Macie who concurs with the interpretation. Diagnosis called to Rock armin Bread at [facility] by Dr. Reed on 05/31/2024 at 9:35 AM.  DATE SIGNED OUT: 05/31/2024 ELECTRONIC SIGNATURE : Picklesimer Md, Fred , Sports Administrator, Electronic Signature  MICROSCOPIC DESCRIPTION  CASE COMMENTS STAINS USED IN DIAGNOSIS: H&E-2 H&E-3 H&E-4 H&E *RECUT 1 SLIDE Stains used in diagnosis 1 Her2 by IHC, 1 ER-ACIS, 1 KI-67-ACIS, 1 PR-ACIS IHC scores are reported using ASCO/CAP scoring criteria. An IHC Score of 0 or 1+ is NEGATIVE for HER2, 3+ is POSITIVE for HER2, and 2+ is EQUIVOCAL. Equivocal results are reflexed to either FISH or IHC testing. Specimens are fixed in 10% Neutral Buffered Formalin for at least 6 hours and up to 72 hours. These tests have not be validated on decalcified tissue. Results should be  interpreted with caution given the possibility of false negative results on decalcified specimens. Antibody Clone for HER2 is 4B5 (PATHWAY). Some of these immunohistochemical stains may have been developed and the performance characteristics determined by [facility]. Some may not have been cleared or approved by the U.S. Food and Drug Administration. The FDA  has determined that such clearance or approval is not necessary. This test is used for clinical purposes. It should not be regarded as investigational or for research. This laboratory is certified under the Clinical Laboratory Improvement Amendments of 1988 (CLIA-88) as qualified to perform high complexity clinical laboratory testing. Estrogen receptor (6F11), immunohistochemical stains are performed on formalin fixed, paraffin embedded tissue using a 3,3-diaminobenzidine (DAB) chromogen and Leica Bond Autostainer System. The staining intensity of the nucleus is scored manually and is reported as the percentage of tumor cell nuclei demonstrating specific nuclear staining.Specimens are fixed in 10% Neutral Buffered Formalin for at least 6 hours and up to 72 hours. These tests have not be validated on decalcified tissue. Results should be interpreted with caution given the possibility of false negative results on decalcified specimens. Ki-67 (MM1), immunohistochemical stains are performed on formalin fixed, paraffin embedded tissue using a 3,3-diaminobenzidine (DAB) chromogen and Leica Bond Autostainer System. The staining intensity of the nucleus is scored manually and is reported as the percentage of tumor cell nuclei demonstrating specific nuclear staining.Specimens are fixed in 10% Neutral Buffered Formalin for at least 6 hours and up to 72 hours. These tests have not be validated on decalcified tissue. Results should be interpreted with caution given the possibility of false negative results on decalcified specimens. PR progesterone receptor (16), immunohistochemical stains are performed on formalin fixed, paraffin embedded tissue using a 3,3-diaminobenzidine (DAB) chromogen and Leica Bond Autostainer System. The staining intensity of the nucleus is scored manually and is reported as the percentage of tumor cell nuclei demonstrating specific nuclear staining.Specimens are fixed in  10% Neutral Buffered Formalin for at least 6 hours and up to 72 hours. These tests have not be validated on decalcified tissue. Results should be interpreted with caution given the possibility of false negative results on decalcified specimens.  ADDENDUM Breast, right, needle core biopsy PROGNOSTIC INDICATORS Results: IMMUNOHISTOCHEMICAL AND MORPHOMETRIC ANALYSIS PERFORMED MANUALLY The tumor cells are negative for Her2 (0). Estrogen Receptor: 99%, positive, strong staining intensity Progesterone Receptor: 10%, positive, strong staining intensity Proliferation Marker Ki67: 5% COMMENT: The negative hormone receptor study(ies) in this case has an internal positive control.  REFERENCE RANGE ESTROGEN RECEPTOR NEGATIVE 0% POSITIVE =>1% REFERENCE RANGE PROGESTERONE RECEPTOR NEGATIVE 0% POSITIVE =>1% All controls stained appropriately Pepper Dutton Md, Pathologist, Electronic Signature ( Signed [phone] 2025)  CLINICAL HISTORY  SPECIMEN(S) OBTAINED 1. Breast, right, needle core biopsy, 9:00 4cmfn (heart Clip)  SPECIMEN COMMENTS: 1. TIF: 1:02pm, CIT: < 1 min SPECIMEN CLINICAL INFORMATION: 1. 1.6cm mass benign vs carcinoma, mass is soft, gelatinous consistence  Gross Description 1. Received in formalin labeled Crail,Samya and RTBR 9 o'clock, 4 cmfn (TIF 1302, CIT less than 1 min) are several cores and irregular pieces of pink white to yellow red soft tissue with intermixed gelatinous/mucoid material, 1 x 0.7 x 0.3 cm in aggregate. Submitted in one block. (SW:gt 05/31/24)  Report signed out from the following location(s) [place] [address] [place], [place] [address] CLIA #: [id number]  [facility] [address] [place], [place] [address] CLIA #: [id number]   Assessment and Plan:   Diagnoses and all  orders for this visit:  Breast cancer, stage 1, right (CMS/HHS-HCC)  Discussed breast conserving surgery versus mastectomy and  reconstruction.  Given that her ultrasound shows no lymphadenopathy, recommend right breast seed lumpectomy alone. Refer to medical radiation oncology. Local regional recurrence, survival and long-term quality of life reviewed today. This is contrasted to other surgical options.The procedure has been discussed with the patient. Alternatives to surgery have been discussed with the patient. Risks of surgery include bleeding, Infection, Seroma formation, death, and the need for further surgery. The patient understands and wishes to proceed.    DEBBY CURTISTINE SHIPPER, MD

## 2024-06-28 NOTE — Anesthesia Postprocedure Evaluation (Signed)
 Anesthesia Post Note  Patient: KATERYN MARASIGAN  Procedure(s) Performed: BREAST LUMPECTOMY WITH RADIOACTIVE SEED LOCALIZATION (Right: Breast)     Patient location during evaluation: PACU Anesthesia Type: General Level of consciousness: awake and alert Pain management: pain level controlled Vital Signs Assessment: post-procedure vital signs reviewed and stable Respiratory status: spontaneous breathing, nonlabored ventilation, respiratory function stable and patient connected to nasal cannula oxygen Cardiovascular status: blood pressure returned to baseline and stable Postop Assessment: no apparent nausea or vomiting Anesthetic complications: no   No notable events documented.  Last Vitals:  Vitals:   06/28/24 1600 06/28/24 1634  BP: (!) 148/72 (!) 151/78  Pulse: 64 67  Resp: 19 16  Temp:  (!) 36.2 C  SpO2: 98% 100%    Last Pain:  Vitals:   06/28/24 1634  TempSrc: Temporal  PainSc: 0-No pain                 Epifanio Lamar BRAVO

## 2024-06-28 NOTE — Op Note (Signed)
 Preoperative diagnosis: Right breast cancer upper outer quadrant stage I  Postoperative diagnosis: Same  Procedure: Right breast seed localized lumpectomy  Surgeon: Debby Shipper, MD  Anesthesia: LMA with 0.25% Marcaine with epinephrine  EBL: Minimal  Drains: None  Specimen right breast tissue with seed and clip verified by Faxitron  Indications for procedure: The patient is an 83 year old female with stage I right breast cancer.  She presents today for lumpectomy after reviewing all of her surgical options and being seen by the multidisciplinary clinic.The procedure has been discussed with the patient. Alternatives to surgery have been discussed with the patient.  Risks of surgery include bleeding,  Infection,  Seroma formation, death,  and the need for further surgery.   The patient understands and wishes to proceed.    Description of procedure: The patient was met in the holding area and questions were answered.  Of note his seed was placed by radiology as an outpatient.  She was taken back to the operating room placed upon the OR table.  After induction of general anesthesia, right breast was prepped and draped in a sterile fashion timeout performed.  Proper patient, site and procedure verified.  Neoprobe used to identify seed right breast upper outer quadrant.  Curvilinear incision was made over the signal.  All tissue around the seed and clip were excised with grossly negative margins.  Of note there was a hematoma.  Imaging revealed the seed and clip to be in the specimen and the tissue was oriented with ink and sent to pathology.  Hemostasis achieved with cautery.  Clips were placed to mark the cavity.  Local anesthetic infiltrated throughout.  The deep tissue planes were approximated with 3-0 Vicryl.  4 Monocryl was used to close the skin in a subcuticular fashion.  Dermabond applied.  All counts found to be correct.  The patient was awoke, extubated taken to recovery in satisfactory  condition.

## 2024-06-28 NOTE — Discharge Instructions (Addendum)
[facility] Office Phone Number [phone]  BREAST BIOPSY/ PARTIAL MASTECTOMY: POST OP INSTRUCTIONS  Always review your discharge instruction sheet given to you by the facility where your surgery was performed.  IF YOU HAVE DISABILITY OR FAMILY LEAVE FORMS, YOU MUST BRING THEM TO THE OFFICE FOR PROCESSING.  DO NOT GIVE THEM TO YOUR DOCTOR.  A prescription for pain medication may be given to you upon discharge.  Take your pain medication as prescribed, if needed.  If narcotic pain medicine is not needed, then you may take acetaminophen (Tylenol) or ibuprofen (Advil) as needed. Take your usually prescribed medications unless otherwise directed If you need a refill on your pain medication, please contact your pharmacy.  They will contact our office to request authorization.  Prescriptions will not be filled after 5pm or on week-ends. You should eat very light the first 24 hours after surgery, such as soup, crackers, pudding, etc.  Resume your normal diet the day after surgery. Most patients will experience some swelling and bruising in the breast.  Ice packs and a good support bra will help.  Swelling and bruising can take several days to resolve.  It is common to experience some constipation if taking pain medication after surgery.  Increasing fluid intake and taking a stool softener will usually help or prevent this problem from occurring.  A mild laxative (Milk of Magnesia or Miralax) should be taken according to package directions if there are no bowel movements after 48 hours. Unless discharge instructions indicate otherwise, you may remove your bandages 24-48 hours after surgery, and you may shower at that time.  You may have steri-strips (small skin tapes) in place directly over the incision.  These strips should be left on the skin for 7-10 days.  If your surgeon used skin glue on the incision, you may shower in 24 hours.  The glue will flake off over the next 2-3 weeks.  Any  sutures or staples will be removed at the office during your follow-up visit. ACTIVITIES:  You may resume regular daily activities (gradually increasing) beginning the next day.  Wearing a good support bra or sports bra minimizes pain and swelling.  You may have sexual intercourse when it is comfortable. You may drive when you no longer are taking prescription pain medication, you can comfortably wear a seatbelt, and you can safely maneuver your car and apply brakes. RETURN TO WORK:  ______________________________________________________________________________________ You should see your doctor in the office for a follow-up appointment approximately two weeks after your surgery.  Your doctor's nurse will typically make your follow-up appointment when she calls you with your pathology report.  Expect your pathology report 2-3 business days after your surgery.  You may call to check if you do not hear from us after three days. OTHER INSTRUCTIONS: _______________________________________________________________________________________________ _____________________________________________________________________________________________________________________________________ _____________________________________________________________________________________________________________________________________ _____________________________________________________________________________________________________________________________________  WHEN TO CALL YOUR DOCTOR: Fever over 101.0 Nausea and/or vomiting. Extreme swelling or bruising. Continued bleeding from incision. Increased pain, redness, or drainage from the incision.  The clinic staff is available to answer your questions during regular business hours.  Please don't hesitate to call and ask to speak to one of the nurses for clinical concerns.  If you have a medical emergency, go to the nearest emergency room or call 911.  A surgeon from [facility] is always on call at the hospital.  For further questions, please visit [email or web address]    Post Anesthesia Home Care Instructions  Activity: Get plenty of rest for the remainder of   for the remainder of the day. A responsible individual must stay with you for 24 hours following the procedure.  For the next 24 hours, DO NOT: -Drive a car -Operate machinery -Drink alcoholic beverages -Take any medication unless instructed by your physician -Make any legal decisions or sign important papers.  Meals: Start with liquid foods such as gelatin or soup. Progress to regular foods as tolerated. Avoid greasy, spicy, heavy foods. If nausea and/or vomiting occur, drink only clear liquids until the nausea and/or vomiting subsides. Call your physician if vomiting continues.  Special Instructions/Symptoms: Your throat may feel dry or sore from the anesthesia or the breathing tube placed in your throat during surgery. If this causes discomfort, gargle with warm salt water. The discomfort should disappear within 24 hours.      

## 2024-06-28 NOTE — Interval H&P Note (Signed)
 History and Physical Interval Note:  06/28/2024 2:09 PM  Rhonda Andrews  has presented today for surgery, with the diagnosis of RIGHT BREAST CANCER.  The various methods of treatment have been discussed with the patient and family. After consideration of risks, benefits and other options for treatment, the patient has consented to  Procedure(s) with comments: BREAST LUMPECTOMY WITH RADIOACTIVE SEED LOCALIZATION (Right) - RIGHT BREAST SEED LUMPECTOMY as a surgical intervention.  The patient's history has been reviewed, patient examined, no change in status, stable for surgery.  I have reviewed the patient's chart and labs.  Questions were answered to the patient's satisfaction.     Dulce Martian A Larance Ratledge

## 2024-06-28 NOTE — Transfer of Care (Signed)
 Immediate Anesthesia Transfer of Care Note  Patient: Rhonda Andrews  Procedure(s) Performed: BREAST LUMPECTOMY WITH RADIOACTIVE SEED LOCALIZATION (Right: Breast)  Patient Location: PACU  Anesthesia Type:General  Level of Consciousness: drowsy  Airway & Oxygen Therapy: Patient Spontanous Breathing and Patient connected to face mask oxygen  Post-op Assessment: Report given to RN and Post -op Vital signs reviewed and stable  Post vital signs: Reviewed and stable  Last Vitals:  Vitals Value Taken Time  BP 140/63 06/28/24 15:24  Temp    Pulse 65 06/28/24 15:26  Resp 19 06/28/24 15:26  SpO2 100 % 06/28/24 15:26  Vitals shown include unfiled device data.  Last Pain: There were no vitals filed for this visit.       Complications: No notable events documented.

## 2024-06-29 ENCOUNTER — Encounter (HOSPITAL_BASED_OUTPATIENT_CLINIC_OR_DEPARTMENT_OTHER): Payer: Self-pay | Admitting: Surgery

## 2024-06-30 ENCOUNTER — Telehealth (INDEPENDENT_AMBULATORY_CARE_PROVIDER_SITE_OTHER): Payer: Self-pay | Admitting: Gastroenterology

## 2024-06-30 ENCOUNTER — Ambulatory Visit (INDEPENDENT_AMBULATORY_CARE_PROVIDER_SITE_OTHER): Admitting: Gastroenterology

## 2024-06-30 ENCOUNTER — Encounter (INDEPENDENT_AMBULATORY_CARE_PROVIDER_SITE_OTHER): Payer: Self-pay | Admitting: Gastroenterology

## 2024-06-30 VITALS — BP 135/77 | HR 66 | Temp 97.8°F | Ht 65.0 in | Wt 140.0 lb

## 2024-06-30 DIAGNOSIS — F411 Generalized anxiety disorder: Secondary | ICD-10-CM

## 2024-06-30 DIAGNOSIS — R197 Diarrhea, unspecified: Secondary | ICD-10-CM | POA: Diagnosis not present

## 2024-06-30 DIAGNOSIS — K591 Functional diarrhea: Secondary | ICD-10-CM

## 2024-06-30 MED ORDER — PANCRELIPASE (LIP-PROT-AMYL) 36000-114000 UNITS PO CPEP: 36000.0000 [IU] | ORAL_CAPSULE | Freq: Three times a day (TID) | ORAL | Status: DC

## 2024-06-30 NOTE — Addendum Note (Signed)
 Addended by: Darrek Leasure on: 06/30/2024 09:50 AM   Modules accepted: Orders

## 2024-06-30 NOTE — Progress Notes (Addendum)
 Referring Provider: [name], MD Primary Care Physician:  [name], MD Primary GI Physician: Dr. Eartha   Chief Complaint  Patient presents with   Diarrhea    Patient here today due to having issues with diarrhea. She is having multiple loose stools per day. She is taking Imodium  as needed.   HPI:   Rhonda Andrews is a 83 y.o. female with past medical history of recurrent C.diff s/p fecal transplant, GERD, IBS-D, anxiety, HTN, Pre diabetes, and colitis presents to the clinic for follow up of chronic diarrhea.   Patient presenting today for:  diarrhea, likely IBS/Functional   Last seen September, by Dr. Eartha, at that time having 2-3 mushy stools per day, very occasionally has a normal stool, using imodium  intermittently.   Recommended continue imodium  1/2-1 pill daily, consider plant enzymes  Last labs with Hgb 16, CBC otherwise unremarkable, CMP WNL   Present: Patient states she has had diarrhea all her life. She reports she was told her diarrhea was related to anxiety in the past. She had breast surgery earlier this week and notes that she had a lot of diarrhea prior to this. She endorses a lot of ongoing stress with caring for her husband with Alzheimer's disease. She has little to no support from family and is a full time caregiver. She states that diarrhea does not seem any worse recently than it has been. Can have anywhere from 1-6 BMs per day, stools range from solid to watery. Not having fecal incontinence. She is taking imodium  1/2 to 1 tablet per day. She states she cannot eat a lot of foods such as salad, reports that prior to her GB removal she did not have issue with eating these. Anything spicy, dairy, greasy, citrus foods. She states she is not currently on anything for anxiety, was on something many years ago. She states she did talk to PCP about anxiety but all they offered her was xanax. She denies any abdominal pain. No rectal bleeding or  melena.    Previous workup : referred to [facility] for SIBO breath test, which came back normal (reports previous history of positive SIBO testing, treated with xifaxan  at [facility]) Had sucrose deficiency testing which was negative.   Celiac disease panel was checked and was negative.  No improvement with trial of xifaxan  in the April 2024 No improvement with cholestyramine    Last Colonoscopy:2019 The perianal and digital rectal examinations were normal. A few diverticula were found in the sigmoid colon. The terminal ileum appeared normal. The exam was otherwise without abnormality on direct and retroflexion views.  Fecal Microbiota Transplant (Bacteriotherapy): Donor stool was prepared by a third party (purchased) as per protocol. Approximately 450 mL of the emulsified donor stool was instilled in the terminal ileum. A detailed colonoscopic detailed colonoscopic exam could not be performed upon scope withdrawal secondary to limited visibility from the instilled stool.  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)    GERD (gastroesophageal reflux disease)    Hypertension    IBS (irritable bowel syndrome)    Lactose intolerance    Palpitation    Palpitations    Pre-diabetes    Vitamin D  deficiency disease 06/23/2019    Past Surgical History:  Procedure Laterality Date   ABDOMINAL HYSTERECTOMY  ovaries remained   ABDOMINAL HYSTERECTOMY     BREAST BIOPSY Right 05/30/2024   US  RT BREAST BX W LOC DEV 1ST LESION IMG BX SPEC US  GUIDE 05/30/2024 [facility]   BREAST BIOPSY  06/24/2024   US  RT RADIOACTIVE SEED LOC 06/24/2024 [facility]   BREAST LUMPECTOMY WITH RADIOACTIVE SEED LOCALIZATION Right 06/28/2024   Procedure: BREAST LUMPECTOMY WITH RADIOACTIVE SEED LOCALIZATION;  Surgeon: Vanderbilt Ned, MD;  Location: [facility];  Service: General;  Laterality: Right;  RIGHT BREAST SEED LUMPECTOMY   CHOLECYSTECTOMY     COLONOSCOPY  2010   COLONOSCOPY WITH PROPOFOL  N/A 05/21/2018   Procedure: COLONOSCOPY WITH PROPOFOL ;  Surgeon: Avram Lupita BRAVO, MD;  Location: [facility];  Service: Endoscopy;  Laterality: N/A;  with FMT   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   Procedure: FECAL TRANSPLANT;  Surgeon: Avram Lupita BRAVO, MD;  Location: [facility];  Service: Endoscopy;;   KNEE ARTHROSCOPY Right    Precancerous  Tissue - Nose  10/15/2012    Current Outpatient Medications  Medication Sig Dispense Refill   Cholecalciferol (VITAMIN D -3) 125 MCG (5000 UT) TABS Take 1 tablet by mouth daily.     famotidine  (PEPCID ) 20 MG tablet Take 1 tablet (20 mg total) by mouth at bedtime. (Patient taking differently: Take 20 mg by mouth as needed.) 90 tablet 3   fluticasone  (FLONASE ) 50 MCG/ACT nasal spray PLACE 2 SPRAYS IN BOTH NOSTRILS DAILY. (Patient taking differently: as needed.) 16 g 3   loperamide  (IMODIUM  A-D) 2 MG tablet Take 0.5 tablets (1 mg total) by mouth 3 (three) times daily as needed for diarrhea or loose stools. Can take before each meal if needed. (Patient taking differently: Take 1 mg by mouth as needed. Can take before each meal if needed.) 90 tablet 1   meclizine  (ANTIVERT ) 12.5 MG tablet Take 1 tablet (12.5 mg total) by mouth 3 (three) times daily as needed for dizziness. 30 tablet 2   OVER THE COUNTER MEDICATION Essential Enzymes 500 mg 1/2 daily prn     propranolol  (INDERAL ) 60 MG tablet Take 1 tablet (60 mg total) by mouth 2 (two) times daily. (Patient taking differently: Take 60 mg by mouth as needed.) 60 tablet 3   No current facility-administered medications for this visit.    Allergies as of 06/30/2024 - Review Complete 06/30/2024  Allergen Reaction Noted   Hydrocodone Nausea And Vomiting 05/19/2018   Prednisone Other (See Comments) 10/28/2014    Social History    Socioeconomic History   Marital status: Married    Spouse name: Not on file   Number of children: 1   Years of education: Not on file   Highest education level: Some college, no degree  Occupational History   Not on file  Tobacco Use   Smoking status: Never   Smokeless tobacco: Never  Vaping Use   Vaping status: Never Used  Substance and Sexual Activity   Alcohol use: No    Alcohol/week: 0.0 standard drinks of alcohol   Drug use: No   Sexual activity: Not Currently    Birth control/protection: Surgical    Comment: hyst  Other Topics Concern   Not on file  Social History Narrative   Married. Has a daughter. 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   Eats all food groups and has a great appetite.    Walks in house daily in her house, 1.5 miles a day.   Right handed  Quit drinking caffeine 15 years ago   No EtOH/tobacco   Social Drivers of Corporate Investment Banker Strain: Not on file  Food Insecurity: No Food Insecurity (06/20/2024)   Hunger Vital Sign    Worried About Running Out of Food in the Last Year: Never true    Ran Out of Food in the Last Year: Never true  Transportation Needs: No Transportation Needs (06/20/2024)   PRAPARE - Administrator, Civil Service (Medical): No    Lack of Transportation (Non-Medical): No  Physical Activity: Inactive (07/17/2017)   Exercise Vital Sign    Days of Exercise per Week: 0 days    Minutes of Exercise per Session: 0 min  Stress: No Stress Concern Present (07/17/2017)   [employer] of Occupational Health - Occupational Stress Questionnaire    Feeling of Stress : Only a little  Social Connections: Socially Integrated (07/17/2017)   Social Connection and Isolation Panel    Frequency of Communication with Friends and Family: More than three times a week    Frequency of Social Gatherings with Friends and Family: More than three times a week     Attends Religious Services: More than 4 times per year    Active Member of [employer] or Organizations: Yes    Attends Engineer, Structural: More than 4 times per year    Marital Status: Married    Review of systems General: negative for malaise, night sweats, fever, chills, weight loss Neck: Negative for lumps, goiter, pain and significant neck swelling Resp: Negative for cough, wheezing, dyspnea at rest CV: Negative for chest pain, leg swelling, palpitations, orthopnea GI: denies melena, hematochezia, nausea, vomiting, constipation, dysphagia, odyonophagia, early satiety or unintentional weight loss. +diarrhea MSK: Negative for joint pain or swelling, back pain, and muscle pain. Derm: Negative for itching or rash Psych: Denies depression, anxiety, memory loss, confusion. No homicidal or suicidal ideation.  Heme: Negative for prolonged bleeding, bruising easily, and swollen nodes. Endocrine: Negative for cold or heat intolerance, polyuria, polydipsia and goiter. Neuro: negative for tremor, gait imbalance, syncope and seizures. The remainder of the review of systems is noncontributory.  Physical Exam: BP 135/77 (BP Location: Left Arm, Patient Position: Sitting, Cuff Size: Normal)   Pulse 66   Temp 97.8 F (36.6 C) (Temporal)   Ht 5' 5 (1.651 m)   Wt 140 lb (63.5 kg)   BMI 23.30 kg/m  General:   Alert and oriented. No distress noted. Pleasant and cooperative.  Head:  Normocephalic and atraumatic. Eyes:  Conjuctiva clear without scleral icterus. Mouth:  Oral mucosa pink and moist. Good dentition. No lesions. Heart: Normal rate and rhythm, s1 and s2 heart sounds present.  Lungs: Clear lung sounds in all lobes. Respirations equal and unlabored. Abdomen:  +BS, soft, non-tender and non-distended. No rebound or guarding. No HSM or masses noted. Derm: No palmar erythema or jaundice Msk:  Symmetrical without gross deformities. Normal posture. Extremities:  Without  edema. Neurologic:  Alert and  oriented x4 Psych:  Alert and cooperative. Tearful at times, mildly anxious   Invalid input(s): 6 MONTHS   ASSESSMENT: FANNIE GATHRIGHT is a 83 y.o. female presenting today for diarrhea, likely secodnary to IBS/ functional   Patient with long history of diarrhea with extensive testing as above. Has failed multiple therapies in the past, currently taking imodium . She endorses a lot of anxiety with caring for husband with alzheimers and currently undergoing treatment for breast cancer. She feels anxiety is really driving  her symptoms which I agree with. Notably she has not had testing for EPI and has not been trialed on pancreatic enzymes, I think this would be reasonable to trial to see if this provides any improvement in symptoms. She does tell me she mentioned anxiety meds to her PCP who offered her xanax which she does not want to take as she feels it will cause her to be drowsy or sedated. I encouraged her to reach out to PCP again to discuss starting something for anxiety that can be taken safely on a daily basis, however, we could consider trial of low dose buspar to see if this helps with functional symptoms if PCP is not willing to treat her anxiety.    PLAN:  -creon samples 72k with meals and 36k with snacks -discuss anxiety with PCP  -consider low dose buspar for functional symptoms  All questions were answered, patient verbalized understanding and is in agreement with plan as outlined above.   Follow Up: 2 months   Jamahl Lemmons L. Mariette, MSN, APRN, AGNP-C Adult-Gerontology Nurse Practitioner [facility]  I have reviewed the note and agree with the APP's assessment as described in this progress note  Toribio Fortune, MD Gastroenterology and Hepatology [facility]astroenter[facility]

## 2024-06-30 NOTE — Telephone Encounter (Signed)
 Medication Samples have been provided to the patient.  Drug name: Creon       Strength: 36000 units        Qty: 6 boxes   LOT: [id number]  Exp.Date: 09/26  Dosing instructions: take 2 with your meal (take them as you start to eat) and 1 with any snack   The patient has been instructed regarding the correct time, dose, and frequency of taking this medication, including desired effects and most common side effects.   Rhonda Andrews 9:49 AM 06/30/2024

## 2024-06-30 NOTE — Patient Instructions (Signed)
 Please reach out to your PCP regarding anxiety medication I am providing creon samples to you, take 2 with your meal (take them as you start to eat) and 1 with any snack Let me know how you are doing in about 7-10 days  Follow up 2 months  It was a pleasure to see you today. I want to create trusting relationships with patients and provide genuine, compassionate, and quality care. I truly value your feedback! please be on the lookout for a survey regarding your visit with me today. I appreciate your input about our visit and your time in completing this!    Diondra Pines L. Emmalou Hunger, MSN, APRN, AGNP-C Adult-Gerontology Nurse Practitioner [facility] Gastroenterology at [facility]

## 2024-07-01 ENCOUNTER — Ambulatory Visit: Payer: Self-pay | Admitting: Surgery

## 2024-07-01 LAB — SURGICAL PATHOLOGY

## 2024-07-05 ENCOUNTER — Encounter: Payer: Self-pay | Admitting: *Deleted

## 2024-07-11 ENCOUNTER — Ambulatory Visit: Attending: Cardiology | Admitting: Cardiology

## 2024-07-11 ENCOUNTER — Encounter: Payer: Self-pay | Admitting: Cardiology

## 2024-07-11 VITALS — BP 148/78 | HR 81 | Ht 65.0 in | Wt 137.4 lb

## 2024-07-11 DIAGNOSIS — R030 Elevated blood-pressure reading, without diagnosis of hypertension: Secondary | ICD-10-CM

## 2024-07-11 DIAGNOSIS — R002 Palpitations: Secondary | ICD-10-CM

## 2024-07-11 NOTE — Patient Instructions (Signed)
 Medication Instructions:  Continue all current medications.   Labwork: none  Testing/Procedures: none  Follow-Up: 6 months   Any Other Special Instructions Will Be Listed Below (If Applicable).   If you need a refill on your cardiac medications before your next appointment, please call your pharmacy.

## 2024-07-11 NOTE — Progress Notes (Signed)
 Clinical Summary Rhonda Andrews is a 83 y.o.female seen today for follow up of the following medical problems.    1. Palpitations/Dizziness - previous holter 05/2009 showed no significant arrhythmias.   - during prior admission did have short episode of PSVT - she was changed from propanolol to atenolol . With change had increase in symptoms and she changed herself back to propanolol - she reports short acting propanolol does not work as well, prefers long acting.    - diltiazem  caused low bp's at home per her report, she stopped taking.     - tried midodine x 2 weeks, SBP 130s. Reports insomnia and she stopped taking. We had tried to see if could stabilize her bp to allow more stable use of her beta blocker   - previously she was on an unconvnetional way of taking propranolol  long acting that she has done for many years, she splits the individual capsule into 2 separate doses. We have tried several other doses and meds which she did not tolerate and thus have accepted this atypical regimen.   - prn propranolol  is controlling her symptoms   2.Elevated blood pressure - occasional high bp'at times, not consistent. - previously tried low dose norvasc  2.5mg , did not tolerate due to headache, dizziness, LE edema.  - home bp's variable, sbp ca be 120s to 140s at times, rarely 150s or higher. Checks bp multiple times a day, sometimes checks when stressed or anxous.    - still checking at home. Home bp's typically in AM 140s-160/100 when wakes up, in evening typically 105/55 - she reports some concerns about anxiety, has been discussing with pcp possibly interventions.      2. Prior history of vertigo     3.Breast cancer - recent lumpectomy 06/2024 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     famotidine  (PEPCID ) 20 MG tablet Take 1 tablet (20 mg total) by mouth at bedtime. (Patient taking differently: Take 20 mg by mouth as needed.) 90 tablet 3   fluticasone  (FLONASE ) 50 MCG/ACT nasal spray PLACE 2 SPRAYS IN BOTH NOSTRILS DAILY. (Patient taking differently: as needed.) 16 g 3   lipase/protease/amylase (CREON) 36000 UNITS CPEP capsule Take 1 capsule (36,000 Units total) by mouth 3 (three) times daily before meals.     loperamide  (IMODIUM  A-D) 2 MG tablet Take 0.5 tablets (1 mg total) by mouth 3 (three) times daily as needed for diarrhea or loose stools. Can take before each meal if needed. (Patient taking differently: Take 1 mg by mouth as needed. Can take before each meal if needed.) 90 tablet 1   meclizine  (ANTIVERT ) 12.5 MG tablet Take 1 tablet (12.5 mg total) by mouth 3 (three) times daily as needed for  dizziness. 30 tablet 2   OVER THE COUNTER MEDICATION Essential Enzymes 500 mg 1/2 daily prn     propranolol  (INDERAL ) 60 MG tablet Take 1 tablet (60 mg total) by mouth 2 (two) times daily. (Patient taking differently: Take 60 mg by mouth as needed.) 60 tablet 3   No current facility-administered medications for this visit.     Past Surgical History:  Procedure Laterality Date   ABDOMINAL HYSTERECTOMY     ovaries remained   ABDOMINAL HYSTERECTOMY     BREAST BIOPSY Right 05/30/2024   US  RT BREAST BX W LOC DEV 1ST LESION IMG BX SPEC US  GUIDE 05/30/2024 [facility]   BREAST BIOPSY  06/24/2024   US   RT RADIOACTIVE SEED LOC 06/24/2024 [facility]   BREAST LUMPECTOMY WITH RADIOACTIVE SEED LOCALIZATION Right 06/28/2024   Procedure: BREAST LUMPECTOMY WITH RADIOACTIVE SEED LOCALIZATION;  Surgeon: Vanderbilt Ned, MD;  Location: [facility];  Service: General;  Laterality: Right;  RIGHT BREAST SEED LUMPECTOMY   CHOLECYSTECTOMY     COLONOSCOPY  2010   COLONOSCOPY WITH PROPOFOL  N/A 05/21/2018   Procedure: COLONOSCOPY WITH PROPOFOL ;  Surgeon: Avram Lupita BRAVO, MD;  Location: [facility];  Service: Endoscopy;  Laterality: N/A;  with FMT   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   Procedure: FECAL TRANSPLANT;  Surgeon: Avram Lupita BRAVO, MD;  Location: [facility];  Service: Endoscopy;;   KNEE ARTHROSCOPY Right    Precancerous  Tissue - Nose  10/15/2012     Allergies  Allergen Reactions   Hydrocodone Nausea And Vomiting   Prednisone Other (See Comments)    Heart races      Family History  Problem Relation Age of Onset   Lymphoma Mother    Colon cancer Mother    Hypertension Mother    Migraines Mother    Other Father        Wagner's Granulermatosis   Seizures Sister    Brain cancer Maternal Grandmother    Brain cancer Maternal Grandfather    Other Paternal Grandmother        blood clot - DVT following surgery   Alcohol abuse Paternal Grandfather    Cirrhosis Paternal Grandfather    Thyroid  disease Sister    Heart disease Sister      Social History Ms. Hartsough reports that she has never smoked. She has never used smokeless tobacco. Ms. Artz reports no history of alcohol use.     Physical Examination Today's Vitals   07/11/24 0812 07/11/24 0836  BP: (!) 162/100 (!) 148/78  Pulse: 81   SpO2: 99%   Weight: 137 lb 6.4 oz (62.3 kg)   Height: 5' 5 (1.651 m)    Body mass index is 22.86 kg/m.  Gen: resting comfortably, no acute distress HEENT: no scleral icterus, pupils equal round and reactive, no palptable cervical adenopathy,  CV: RRR, no m/rg, no  jvd Resp: Clear to auscultation bilaterally GI: abdomen is soft, non-tender, non-distended, normal bowel sounds, no hepatosplenomegaly MSK: extremities are warm, no edema.  Skin: warm, no rash Neuro:  no focal deficits Psych: appropriate affect    Assessment and Plan   1. Palpitations - long history, only medication she has tolerated is prn propanolol. Atypical in she will cut the pill in quarters. We have tried several other regimens and has not tolerated so we have accepted this atypical regimen.  - overall doing well, continue current meds   2.Elevated blood pressure - occasional high bp's. She at times checks multiple times a day,  sometimes when stressed or anxious. For the majority home bp's are controlled. Prior issues with dizziness on just low dose norvasc  in the past 2.5mg , in general very sensitive to medications.  - Unless bp's are clearly consistently elevated would not reattempt bp medication - of note 20 point difference between our manual cuff and her electronic, with hers being higher  - bp's remain up and down, often higher in the mornings and low normal in the evenings. She reports symptoms of anxiety she is discussing with her pcp, I think likely playing a signficant role in up and down bp's. In general high threshold to start bp medications for reasons stated above   F/u 6 months       Dorn PHEBE Ross, M.D

## 2024-07-18 NOTE — Progress Notes (Signed)
 Radiation Oncology         [phone] ________________________________  Name: Rhonda Andrews        MRN: [id number]  Date of Service: 07/21/2024 DOB: 1941-01-03  CC:[name], MD  Lanny Callander, MD     REFERRING PHYSICIAN: Lanny Callander, MD   DIAGNOSIS: The encounter diagnosis was Malignant neoplasm of upper-outer quadrant of right breast in female, estrogen receptor positive (HCC).   HISTORY OF PRESENT ILLNESS: Rhonda Andrews is a 83 y.o. female seen at the request of Dr. Vanderbilt for a new diagnosis of right breast cancer. The patient was noted to have a screening detected possible mass in the right breast.  She returned on 05/27/2024 for diagnostic workup.  The mass was reproducible on spot compression imaging in the upper outer quadrant between 9 and 10:00.  On exam the mass was palpable with a focal area of skin elevation and discoloration.  By ultrasound, there was a hypoechoic mass in the 9 o'clock position of the right breast measuring 1.6 cm in greatest dimension.  A tract of hypoechogenicity extends from the mass to the skin and the axilla was negative for adenopathy.  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  Her cancer was ER/PR positive, HER2 negative with a Ki-67 of 5%.    Since her last visit, the patient underwent a right lumpectomy on 06/28/2024 with Dr. Vanderbilt.  Final pathology showed a grade 2 invasive ductal carcinoma with extracellular mucin measuring 2.1 cm.  Associated intermediate grade DCIS was noted, she had clear margins for DCIS with her closest being 4 mm to the posterior margin and involved anterior margin for invasive disease. Since her margin is the skin anteriorly, Dr. Vanderbilt did not recommend additional surgery. She's seen to discuss adjuvant radiotherapy.     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)    GERD (gastroesophageal reflux disease)    Hypertension    IBS (irritable bowel syndrome)    Lactose intolerance    Palpitation    Palpitations    Pre-diabetes    Vitamin D  deficiency disease 06/23/2019       PAST SURGICAL HISTORY: Past Surgical History:  Procedure Laterality Date   ABDOMINAL HYSTERECTOMY     ovaries remained   ABDOMINAL HYSTERECTOMY     BREAST BIOPSY Right 05/30/2024   US  RT BREAST BX W LOC DEV 1ST LESION IMG BX SPEC US  GUIDE 05/30/2024 [facility]   BREAST BIOPSY  06/24/2024   US  RT RADIOACTIVE SEED LOC 06/24/2024 [facility]   BREAST LUMPECTOMY WITH RADIOACTIVE SEED LOCALIZATION Right 06/28/2024   Procedure: BREAST LUMPECTOMY WITH RADIOACTIVE SEED LOCALIZATION;  Surgeon: Vanderbilt Ned, MD;  Location: [facility];  Service: General;  Laterality: Right;  RIGHT BREAST SEED LUMPECTOMY   CHOLECYSTECTOMY     COLONOSCOPY  2010   COLONOSCOPY WITH PROPOFOL  N/A 05/21/2018   Procedure: COLONOSCOPY WITH PROPOFOL ;  Surgeon: Avram Lupita BRAVO, MD;  Location: [facility];  Service: Endoscopy;  Laterality: N/A;  with FMT   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   Procedure: FECAL TRANSPLANT;  Surgeon: Avram Lupita BRAVO, MD;  Location: [facility];  Service: Endoscopy;;   KNEE ARTHROSCOPY Right    Precancerous  Tissue - Nose  10/15/2012     FAMILY HISTORY:  Family History  Problem Relation Age of Onset   Lymphoma Mother    Colon cancer Mother    Hypertension Mother    Migraines Mother    Other Father        Wagner's Granulermatosis   Seizures Sister    Brain cancer Maternal Grandmother    Brain cancer Maternal Grandfather    Other Paternal Grandmother        blood clot - DVT following surgery   Alcohol abuse Paternal  Grandfather    Cirrhosis Paternal Grandfather    Thyroid  disease Sister    Heart disease Sister      SOCIAL HISTORY:  reports that she has never smoked. She has never used smokeless tobacco. She reports that she does not drink alcohol and does not use drugs. The patient is married and lives in [place]. She is accompanied by her best friend.    ALLERGIES: Hydrocodone and Prednisone   MEDICATIONS:  Current Outpatient Medications  Medication Sig Dispense Refill   Cholecalciferol (VITAMIN D -3) 125 MCG (5000 UT) TABS Take 1 tablet by mouth daily.     famotidine  (PEPCID ) 20 MG tablet Take 1 tablet (20 mg total) by mouth at bedtime. (Patient taking differently: Take 20 mg by mouth as needed.) 90 tablet 3   fluticasone  (FLONASE ) 50 MCG/ACT nasal spray PLACE 2 SPRAYS IN BOTH NOSTRILS DAILY. (Patient taking differently: as needed.) 16 g 3   lipase/protease/amylase (CREON) 36000 UNITS CPEP capsule Take 1 capsule (36,000 Units total) by mouth 3 (three) times daily before meals.     loperamide  (IMODIUM  A-D) 2 MG tablet Take 0.5 tablets (1 mg total) by mouth 3 (three) times daily as needed for diarrhea or loose stools. Can take before each meal if needed. (Patient taking differently: Take 1 mg by mouth as needed. Can take before each meal if needed.) 90 tablet 1   meclizine  (ANTIVERT ) 12.5 MG tablet Take 1 tablet (12.5 mg total) by mouth 3 (three) times daily as needed for dizziness. 30 tablet 2   OVER THE COUNTER MEDICATION Essential Enzymes 500 mg 1/2 daily prn     propranolol  (INDERAL ) 60 MG tablet Take 1 tablet (60 mg total) by mouth 2 (two) times daily. (Patient taking differently: Take 60 mg by mouth as needed.) 60 tablet 3   No current facility-administered medications for this visit.     REVIEW OF SYSTEMS: On review of systems, the patient reports that she is doing well overall and her breast is firm but no painful. No other complaints are verbalized.      PHYSICAL EXAM:  Wt Readings  from Last 3 Encounters:  07/11/24 137 lb 6.4 oz (62.3 kg)  06/30/24 140 lb (63.5 kg)  06/22/24 137 lb 9.1 oz (62.4 kg)   Temp Readings from Last 3 Encounters:  06/30/24 97.8 F (36.6 C) (Temporal)  06/28/24 (!) 97.1 F (36.2 C) (Temporal)  06/21/24 (!) 97.5 F (36.4 C) (Temporal)   BP Readings from Last 3 Encounters:  07/11/24 (!) 148/78  06/30/24 135/77  06/28/24 (!) 151/78   Pulse Readings from Last 3 Encounters:  07/11/24 81  06/30/24 66  06/28/24 67    In general this is a well appearing caucasian female in no acute distress. She's alert and oriented x4 and appropriate throughout the examination. Cardiopulmonary assessment is negative for acute distress and she exhibits normal  effort.  The right breast has a well-healing surgical incision site without erythema, separation or drainage. There is firmness noted consistent with induration of the surgical site.     ECOG = 0  0 - Asymptomatic (Fully active, able to carry on all predisease activities without restriction)  1 - Symptomatic but completely ambulatory (Restricted in physically strenuous activity but ambulatory and able to carry out work of a light or sedentary nature. For example, light housework, office work)  2 - Symptomatic, <50% in bed during the day (Ambulatory and capable of all self care but unable to carry out any work activities. Up and about more than 50% of waking hours)  3 - Symptomatic, >50% in bed, but not bedbound (Capable of only limited self-care, confined to bed or chair 50% or more of waking hours)  4 - Bedbound (Completely disabled. Cannot carry on any self-care. Totally confined to bed or chair)  5 - Death   Raylene MM, Creech RH, Tormey DC, et al. [phone]). Toxicity and response criteria of the [facility]. Am. DOROTHA Bridges. Oncol. 5 (6): 649-55    LABORATORY DATA:  Lab Results  Component Value Date   WBC 8.9 06/21/2024   HGB 16.0 (H) 06/21/2024   HCT 47.5 (H) 06/21/2024    MCV 90.5 06/21/2024   PLT 248 06/21/2024   Lab Results  Component Value Date   NA 140 06/21/2024   K 4.4 06/21/2024   CL 104 06/21/2024   CO2 32 06/21/2024   Lab Results  Component Value Date   ALT 13 06/21/2024   AST 17 06/21/2024   ALKPHOS 75 06/21/2024   BILITOT 0.7 06/21/2024      RADIOGRAPHY: MM Breast Surgical Specimen Result Date: 06/28/2024 CLINICAL DATA:  Status post RIGHT lumpectomy EXAM: SPECIMEN RADIOGRAPH OF THE RIGHT BREAST COMPARISON:  Previous exam(s). FINDINGS: Status post excision of the right breast. The radioactive seed and biopsy marker clip are present, completely intact, and were marked for pathology. IMPRESSION: Specimen radiograph of the right breast. Electronically Signed   By: Aliene Lloyd M.D.   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 EXAM: ULTRASOUND GUIDED RADIOACTIVE SEED LOCALIZATION OF THE RIGHT BREAST DIAGNOSTIC RIGHT MAMMOGRAM POST ULTRASOUND-GUIDED RADIOACTIVE SEED PLACEMENT COMPARISON:  Previous exam(s). FINDINGS: Patient presents for radioactive seed localization prior to RIGHT breast excision. I met with the patient and we discussed the procedure of seed localization including benefits and alternatives. We discussed the high likelihood of a successful procedure. We discussed the risks of the procedure including infection, bleeding, tissue injury and further surgery. We discussed the low dose of radioactivity involved in the procedure. Informed, written consent was given. The usual time-out protocol was performed immediately prior to the procedure. Using ultrasound guidance, sterile technique, 1% lidocaine  and an I-125 radioactive seed, RIGHT breast mass at 9 o'clock 4 CMFN was localized using a lateral approach. The follow-up mammogram images confirm the seed at the  posterior margin of the mass and were marked for Dr. Vanderbilt. Follow-up survey of the patient confirms presence of the radioactive seed. Order number of I-125 seed:  [id number]. Total activity:  0.25 mCi reference Date: 06/13/2024 The patient tolerated the procedure well and was released from the [facility]. She was given instructions regarding seed removal. ACR Breast Density Category b: There are scattered areas of fibroglandular density.  IMPRESSION: Radioactive seed localization right breast. No apparent complications. Electronically Signed   By: Aliene Lloyd M.D.   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 EXAM: ULTRASOUND GUIDED RADIOACTIVE SEED LOCALIZATION OF THE RIGHT BREAST DIAGNOSTIC RIGHT MAMMOGRAM POST ULTRASOUND-GUIDED RADIOACTIVE SEED PLACEMENT COMPARISON:  Previous exam(s). FINDINGS: Patient presents for radioactive seed localization prior to RIGHT breast excision. I met with the patient and we discussed the procedure of seed localization including benefits and alternatives. We discussed the high likelihood of a successful procedure. We discussed the risks of the procedure including infection, bleeding, tissue injury and further surgery. We discussed the low dose of radioactivity involved in the procedure. Informed, written consent was given. The usual time-out protocol was performed immediately prior to the procedure. Using ultrasound guidance, sterile technique, 1% lidocaine  and an I-125 radioactive seed, RIGHT breast mass at 9 o'clock 4 CMFN was localized using a lateral approach. The follow-up mammogram images confirm the seed at the posterior margin of the mass and were marked for Dr. Vanderbilt. Follow-up survey of the patient confirms presence of the radioactive seed. Order number of I-125 seed:  [id number]. Total  activity:  0.25 mCi reference Date: 06/13/2024 The patient tolerated the procedure well and was released from the [facility]. She was given instructions regarding seed removal. ACR Breast Density Category b: There are scattered areas of fibroglandular density. IMPRESSION: Radioactive seed localization right breast. No apparent complications. Electronically Signed   By: Aliene Lloyd M.D.   On: 06/24/2024 11:44       IMPRESSION/PLAN: 1. Stage IA, pT2,cN0M0 grade 2, ER/PR positive invasive ductal carcinoma of the right breast. Dr. Dewey has reviewed the patient's final pathology findings and today we discussed her course, and the recommendation for adjuvant radiotherapy given T2 disease and a positive margin.  Dr. Lanny anticipates adjuvant antiestrogen therapy to follow.  We discussed the delivery and logistics of radiotherapy as well as the option to consider hypofractionated radiotherapy Monday through Friday over 4 weeks versus considering an ultra hypofractionated course once weekly for 5 weeks.  We discussed the risks, benefits, short and long-term effects of each option, the patient is interested in proceeding with ultrahypofractionated therapy once weekly for 5 weeks as curative therapy.  She will follow-up with Dr. Lanny towards the conclusion of radiotherapy to revisit antiestrogen therapy as well. Written consent is obtained and placed in the chart, a copy was provided to the patient.  She will simulate tomorrow.   In a visit lasting 45 minutes, greater than 50% of the time was spent face to face discussing the patient's condition, in preparation for the discussion, and coordinating the patient's care.     Donald KYM Husband, [facility]    **Disclaimer: This note was dictated with voice recognition software. Similar sounding words can inadvertently be transcribed and this note may contain transcription errors which may not have been corrected upon publication of note.**

## 2024-07-18 NOTE — Progress Notes (Signed)
  Location of Breast Cancer: right breast  Histology per Pathology Report:     Receptor Status: ER(pos), PR (pos), Her2-neu (neg), Ki-(5%)  Did patient present with symptoms (if so, please note symptoms) or was this found on screening mammography?:  Found on screening mammogram  Past/Anticipated interventions by surgeon, if any:  lumpectomy with radioactive seed localization on 06/28/24  Past/Anticipated interventions by medical oncology, if any: None   Lymphedema issues, if any:  {:18581} {t:21944}   Pain issues, if any:  {:18581} {PAIN DESCRIPTION:21022940}  Skin issues if any:  SAFETY ISSUES: Prior radiation? {:18581} Pacemaker/ICD? {:18581} Possible current pregnancy? No, female Is the patient on methotrexate? No  Current Complaints / other details:  ***    Dyke JULIANNA Frost, LPN 88/82/7974,88:47 AM

## 2024-07-21 ENCOUNTER — Ambulatory Visit
Admission: RE | Admit: 2024-07-21 | Discharge: 2024-07-21 | Disposition: A | Discharge status: Home / Self Care | Source: Ambulatory Visit | Attending: Radiation Oncology | Admitting: Radiation Oncology

## 2024-07-21 VITALS — BP 158/98 | HR 83 | Temp 97.5°F | Resp 18 | Ht 65.0 in | Wt 137.2 lb

## 2024-07-21 DIAGNOSIS — Z1732 Human epidermal growth factor receptor 2 negative status: Secondary | ICD-10-CM | POA: Diagnosis not present

## 2024-07-21 DIAGNOSIS — E559 Vitamin D deficiency, unspecified: Secondary | ICD-10-CM | POA: Diagnosis not present

## 2024-07-21 DIAGNOSIS — Z1721 Progesterone receptor positive status: Secondary | ICD-10-CM | POA: Diagnosis not present

## 2024-07-21 DIAGNOSIS — K589 Irritable bowel syndrome without diarrhea: Secondary | ICD-10-CM | POA: Insufficient documentation

## 2024-07-21 DIAGNOSIS — Z8619 Personal history of other infectious and parasitic diseases: Secondary | ICD-10-CM | POA: Insufficient documentation

## 2024-07-21 DIAGNOSIS — Z170 Estrogen receptor positive status [ER+]: Secondary | ICD-10-CM | POA: Insufficient documentation

## 2024-07-21 DIAGNOSIS — Z806 Family history of leukemia: Secondary | ICD-10-CM | POA: Diagnosis not present

## 2024-07-21 DIAGNOSIS — I10 Essential (primary) hypertension: Secondary | ICD-10-CM | POA: Insufficient documentation

## 2024-07-21 DIAGNOSIS — C50411 Malignant neoplasm of upper-outer quadrant of right female breast: Secondary | ICD-10-CM

## 2024-07-21 DIAGNOSIS — Z79899 Other long term (current) drug therapy: Secondary | ICD-10-CM | POA: Diagnosis not present

## 2024-07-21 DIAGNOSIS — K219 Gastro-esophageal reflux disease without esophagitis: Secondary | ICD-10-CM | POA: Insufficient documentation

## 2024-07-21 DIAGNOSIS — Z800 Family history of malignant neoplasm of digestive organs: Secondary | ICD-10-CM | POA: Insufficient documentation

## 2024-07-22 ENCOUNTER — Ambulatory Visit
Admission: RE | Admit: 2024-07-22 | Discharge: 2024-07-22 | Disposition: A | Discharge status: Home / Self Care | Source: Ambulatory Visit | Attending: Radiation Oncology | Admitting: Radiation Oncology

## 2024-07-22 DIAGNOSIS — Z510 Encounter for antineoplastic radiation therapy: Secondary | ICD-10-CM | POA: Insufficient documentation

## 2024-07-22 DIAGNOSIS — C50411 Malignant neoplasm of upper-outer quadrant of right female breast: Secondary | ICD-10-CM | POA: Diagnosis present

## 2024-07-25 ENCOUNTER — Encounter: Payer: Self-pay | Admitting: *Deleted

## 2024-07-25 DIAGNOSIS — C50411 Malignant neoplasm of upper-outer quadrant of right female breast: Secondary | ICD-10-CM

## 2024-07-26 ENCOUNTER — Other Ambulatory Visit: Payer: Self-pay

## 2024-07-26 ENCOUNTER — Telehealth: Payer: Self-pay

## 2024-07-26 NOTE — Telephone Encounter (Signed)
 Spoke with patient.  Went over provider's comments from below.  Patient requested to change the 08/19/24 office visit to a telephone visit and patient confirmed 11/25/24 survivorship office visit.    It looks like she is going to have radiation, and we plan to see her before she completes radiation (on same day of her radiation, so it's not extra visit). If she still does not want to see us , ok to schedule a phone visit. If she is more concerned about office copay, then OK to schedule a survivorship 3 months after RT. Thanks    Rhonda Andrews    Received telephone call from the patient inquiring if her 08/19/24 office visit is absolutely necessary in order for her to start the Tamoxifen. Patient stated she has had her right lumpectomy with Dr. Vanderbilt as well as followed up with Dr. Vanderbilt since having the procedure. Patient states she does not want to come in if she does not absolutely have to. Let patient know that I would forward her question to Dr. Mattock and follow-up w/ her accordingly. Patient voiced understanding.

## 2024-08-02 ENCOUNTER — Ambulatory Visit
Admission: RE | Admit: 2024-08-02 | Discharge: 2024-08-02 | Disposition: A | Discharge status: Home / Self Care | Source: Ambulatory Visit | Attending: Hematology | Admitting: Hematology

## 2024-08-02 ENCOUNTER — Other Ambulatory Visit: Payer: Self-pay

## 2024-08-02 DIAGNOSIS — Z807 Family history of other malignant neoplasms of lymphoid, hematopoietic and related tissues: Secondary | ICD-10-CM | POA: Insufficient documentation

## 2024-08-02 DIAGNOSIS — Z79899 Other long term (current) drug therapy: Secondary | ICD-10-CM | POA: Diagnosis not present

## 2024-08-02 DIAGNOSIS — E739 Lactose intolerance, unspecified: Secondary | ICD-10-CM | POA: Insufficient documentation

## 2024-08-02 DIAGNOSIS — Z809 Family history of malignant neoplasm, unspecified: Secondary | ICD-10-CM | POA: Diagnosis not present

## 2024-08-02 DIAGNOSIS — E559 Vitamin D deficiency, unspecified: Secondary | ICD-10-CM | POA: Diagnosis not present

## 2024-08-02 DIAGNOSIS — Z1732 Human epidermal growth factor receptor 2 negative status: Secondary | ICD-10-CM | POA: Diagnosis not present

## 2024-08-02 DIAGNOSIS — Z170 Estrogen receptor positive status [ER+]: Secondary | ICD-10-CM | POA: Insufficient documentation

## 2024-08-02 DIAGNOSIS — Z1721 Progesterone receptor positive status: Secondary | ICD-10-CM | POA: Insufficient documentation

## 2024-08-02 DIAGNOSIS — Z800 Family history of malignant neoplasm of digestive organs: Secondary | ICD-10-CM | POA: Insufficient documentation

## 2024-08-02 DIAGNOSIS — I10 Essential (primary) hypertension: Secondary | ICD-10-CM | POA: Diagnosis not present

## 2024-08-02 DIAGNOSIS — C50411 Malignant neoplasm of upper-outer quadrant of right female breast: Secondary | ICD-10-CM | POA: Diagnosis present

## 2024-08-02 DIAGNOSIS — E119 Type 2 diabetes mellitus without complications: Secondary | ICD-10-CM | POA: Diagnosis not present

## 2024-08-02 DIAGNOSIS — M47812 Spondylosis without myelopathy or radiculopathy, cervical region: Secondary | ICD-10-CM | POA: Insufficient documentation

## 2024-08-02 DIAGNOSIS — M170 Bilateral primary osteoarthritis of knee: Secondary | ICD-10-CM | POA: Insufficient documentation

## 2024-08-02 DIAGNOSIS — K589 Irritable bowel syndrome without diarrhea: Secondary | ICD-10-CM | POA: Insufficient documentation

## 2024-08-02 LAB — RAD ONC ARIA SESSION SUMMARY
Course Elapsed Days: 0
Plan Fractions Treated to Date: 1
Plan Prescribed Dose Per Fraction: 5.7 Gy
Plan Total Fractions Prescribed: 5
Plan Total Prescribed Dose: 28.5 Gy
Reference Point Dosage Given to Date: 5.7 Gy
Reference Point Session Dosage Given: 5.7 Gy
Session Number: 1

## 2024-08-09 ENCOUNTER — Other Ambulatory Visit: Payer: Self-pay

## 2024-08-09 ENCOUNTER — Ambulatory Visit
Admission: RE | Admit: 2024-08-09 | Discharge: 2024-08-09 | Disposition: A | Source: Ambulatory Visit | Attending: Radiation Oncology

## 2024-08-09 DIAGNOSIS — C50411 Malignant neoplasm of upper-outer quadrant of right female breast: Secondary | ICD-10-CM | POA: Diagnosis not present

## 2024-08-09 LAB — RAD ONC ARIA SESSION SUMMARY
Course Elapsed Days: 7
Plan Fractions Treated to Date: 2
Plan Prescribed Dose Per Fraction: 5.7 Gy
Plan Total Fractions Prescribed: 5
Plan Total Prescribed Dose: 28.5 Gy
Reference Point Dosage Given to Date: 11.4 Gy
Reference Point Session Dosage Given: 5.7 Gy
Session Number: 2

## 2024-08-16 ENCOUNTER — Ambulatory Visit
Admission: RE | Admit: 2024-08-16 | Discharge: 2024-08-16 | Disposition: A | Source: Ambulatory Visit | Attending: Radiation Oncology

## 2024-08-16 ENCOUNTER — Other Ambulatory Visit: Payer: Self-pay

## 2024-08-16 DIAGNOSIS — C50411 Malignant neoplasm of upper-outer quadrant of right female breast: Secondary | ICD-10-CM | POA: Diagnosis not present

## 2024-08-16 LAB — RAD ONC ARIA SESSION SUMMARY
Course Elapsed Days: 14
Plan Fractions Treated to Date: 3
Plan Prescribed Dose Per Fraction: 5.7 Gy
Plan Total Fractions Prescribed: 5
Plan Total Prescribed Dose: 28.5 Gy
Reference Point Dosage Given to Date: 17.1 Gy
Reference Point Session Dosage Given: 5.7 Gy
Session Number: 3

## 2024-08-18 NOTE — Assessment & Plan Note (Addendum)
 pT2N0M0, stage IA, ER+/PR+/HER2-, G2 - Discovered on screening mammogram September 2025 - Post right breast lumpectomy on June 28, 2024, surgical path showed 2.1 cm moderated invasive ductal carcinoma and DCIS, grade 2, anterior (skin) surgical margin was positive for invasive cancer.  Dr. Delmon does not recommend further surgery. - She started adjuvant radiation. - Due to her advanced age, Oncotype not recommended, plan to start antiestrogen therapy after radiation.

## 2024-08-19 ENCOUNTER — Inpatient Hospital Stay: Attending: Hematology | Admitting: Hematology

## 2024-08-19 DIAGNOSIS — E559 Vitamin D deficiency, unspecified: Secondary | ICD-10-CM | POA: Diagnosis not present

## 2024-08-19 DIAGNOSIS — C50411 Malignant neoplasm of upper-outer quadrant of right female breast: Secondary | ICD-10-CM | POA: Insufficient documentation

## 2024-08-19 DIAGNOSIS — M810 Age-related osteoporosis without current pathological fracture: Secondary | ICD-10-CM | POA: Insufficient documentation

## 2024-08-19 DIAGNOSIS — E739 Lactose intolerance, unspecified: Secondary | ICD-10-CM | POA: Insufficient documentation

## 2024-08-19 DIAGNOSIS — I10 Essential (primary) hypertension: Secondary | ICD-10-CM | POA: Insufficient documentation

## 2024-08-19 DIAGNOSIS — Z1732 Human epidermal growth factor receptor 2 negative status: Secondary | ICD-10-CM | POA: Insufficient documentation

## 2024-08-19 DIAGNOSIS — Z923 Personal history of irradiation: Secondary | ICD-10-CM | POA: Diagnosis not present

## 2024-08-19 DIAGNOSIS — R5383 Other fatigue: Secondary | ICD-10-CM | POA: Insufficient documentation

## 2024-08-19 DIAGNOSIS — Z170 Estrogen receptor positive status [ER+]: Secondary | ICD-10-CM | POA: Insufficient documentation

## 2024-08-19 DIAGNOSIS — Z1721 Progesterone receptor positive status: Secondary | ICD-10-CM | POA: Diagnosis not present

## 2024-08-19 DIAGNOSIS — Z79810 Long term (current) use of selective estrogen receptor modulators (SERMs): Secondary | ICD-10-CM | POA: Insufficient documentation

## 2024-08-19 DIAGNOSIS — Z8719 Personal history of other diseases of the digestive system: Secondary | ICD-10-CM | POA: Insufficient documentation

## 2024-08-19 DIAGNOSIS — K589 Irritable bowel syndrome without diarrhea: Secondary | ICD-10-CM | POA: Insufficient documentation

## 2024-08-19 DIAGNOSIS — K219 Gastro-esophageal reflux disease without esophagitis: Secondary | ICD-10-CM | POA: Diagnosis not present

## 2024-08-19 MED ORDER — TAMOXIFEN CITRATE 20 MG PO TABS: 20.0000 mg | ORAL_TABLET | Freq: Every day | ORAL | 2 refills | Status: AC

## 2024-08-19 NOTE — Progress Notes (Signed)
 " [facility]   [phone] [phone]) [phone]   Clinic Follow up Note   Patient Care Team: [name], MD as PCP - General (Internal Medicine) Alvan, Dorn FALCON, MD as PCP - Cardiology (Cardiology) Tyree Nanetta SAILOR, RN as Oncology Nurse Navigator Gerome, Devere HERO, RN as Oncology Nurse Navigator Vanderbilt Ned, MD as Consulting Physician (General Surgery) Lanny Callander, MD as Consulting Physician (Hematology) Dewey Rush, MD as Consulting Physician (Radiation Oncology) 08/19/2024  I connected with Rhonda Andrews on 08/19/2024 at 10:20 AM EST by telephone and verified that I am speaking with the correct person using two identifiers.   I discussed the limitations, risks, security and privacy concerns of performing an evaluation and management service by telephone and the availability of in person appointments. I also discussed with the patient that there may be a patient responsible charge related to this service. The patient expressed understanding and agreed to proceed.   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  Dr. Delmon does not recommend further surgery. - She started adjuvant radiation. - Due to her advanced age, Oncotype not recommended, plan to start antiestrogen therapy after radiation.   Assessment & Plan Estrogen receptor positive right breast cancer status post lumpectomy, undergoing adjuvant radiation, (+) DCIS -She is  undergoing adjuvant radiation therapy for estrogen receptor positive right breast cancer, with two treatments remaining. lumpectomy pathology revealed a 2.1 cm, grade 2 tumor with DCIS and a positive margin adjacent to the skin; no further surgery was recommended.  -Tamoxifen is planned as adjuvant endocrine therapy following radiation, preferred over aromatase inhibitors due to lower risk of arthralgia and beneficial effects on bone health. Risks discussed include vasomotor symptoms, slowed metabolism, and increased risk of thromboembolism, with anticipated benefit of reduced recurrence risk and bone protection.  She had a hysterectomy, no risk for endometrial cancer. - Recommended completion of remaining two radiation treatments. - Discussed initiation of tamoxifen 20 mg daily after radiation, to begin in late January. - Called in tamoxifen prescription to [facility] pharmacy. - Advised to delay tamoxifen initiation until at least two weeks to one month post-radiation. - Provided education regarding tamoxifen side effects: vasomotor symptoms, slowed metabolism, increased thromboembolic risk. - Advised to maintain physical activity and healthy diet to mitigate side effects and reduce thromboembolic risk. - Instructed to monitor for side effects and contact office if issues arise. - Provided anticipatory guidance regarding possible dose reduction if intolerable side effects occur. - Scheduled follow-up with survivorship in March and oncology in six months. - Encouraged her to call with any questions or concerns regarding therapy or side effects before next visit.  Osteoporosis Osteoporosis confirmed by bone density scan in March 2023. She is not on bone-strengthening medication but remains physically active with regular weight-bearing activities. Tamoxifen is preferred for adjuvant endocrine therapy due to its bone-strengthening effect, which is beneficial given her osteoporosis. - Discussed tamoxifen  preference over aromatase inhibitors for adjuvant endocrine therapy due to bone-strengthening effect. - Reinforced importance of continued physical activity for bone health.  Plan -  She is tolerating radiation well overall with moderate fatigue.  She will complete in 2 weeks - I recommend adjuvant tamoxifen, she will start in a month.  Benefit and side effects reviewed with her, she agrees to try. - She is scheduled for survivorship in 3 months - Lab and follow-up with me in 6 months     SUMMARY OF ONCOLOGIC HISTORY: Oncology History  Malignant neoplasm of upper-outer quadrant of right breast in female, estrogen receptor positive (HCC)  06/14/2024 Initial Diagnosis   Malignant neoplasm of upper-outer quadrant of right breast in female, estrogen receptor positive (HCC)   06/14/2024 Cancer Staging   Staging form: Breast, AJCC 8th Edition - Clinical stage from 06/14/2024: Stage IA (cT1c, cN0, cM0, G2, ER+, PR+, HER2-) - Signed by Lanell Donald Stagger, PA-C on 06/14/2024 Method of lymph node assessment: Clinical Histologic grading system: 3 grade system   07/18/2024 Cancer Staging   Staging form: Breast, AJCC 8th Edition - Pathologic stage from 07/18/2024: pT2, cN0, cM0, G2, ER+, PR+, HER2- - Signed by Lanny Callander, MD on 08/18/2024 Stage prefix: Initial diagnosis Nuclear grade: G2 Multigene prognostic tests performed: None Histologic grading system: 3 grade system Residual tumor (R): R0     Discussed the use of AI scribe software for clinical note transcription with the patient, who gave verbal consent to proceed.  History of Present Illness Rhonda Andrews is an 83 year old female with ER-positive right breast cancer, status post lumpectomy with positive margin, presenting for oncology follow-up and discussion of anti-estrogen therapy initiation.  She is nearing completion of adjuvant right breast radiation therapy, with two treatments remaining. She has mild fatigue and  hoarseness from radiation but otherwise feels well. She underwent right breast lumpectomy on June 28, 2024, for a 2.1 cm grade 2 tumor with associated ductal carcinoma in situ and a positive margin adjacent to the skin, for which no further surgery was recommended. She is not yet on anti-estrogen therapy.  She has osteoporosis by March 2023 bone density scan and is not taking bone-strengthening medication. She stays physically active with regular weight-bearing activities. She also has chronic arthritis with joint pain but uses no medications and ambulates independently without assistive devices. These comorbidities are relevant to selection and monitoring of anti-estrogen therapy.     REVIEW OF SYSTEMS:   Constitutional: Denies fevers, chills or abnormal weight loss Eyes: Denies blurriness of vision Ears, nose, mouth, throat, and face: Denies mucositis or sore throat Respiratory: Denies cough, dyspnea or wheezes Cardiovascular: Denies palpitation, chest discomfort or lower extremity swelling Gastrointestinal:  Denies nausea, heartburn or change in bowel habits Skin: Denies abnormal skin rashes Lymphatics: Denies new lymphadenopathy or easy bruising Neurological:Denies numbness, tingling or new weaknesses Behavioral/Psych: Mood is stable, no new changes  All other systems were reviewed with the patient and are negative.  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)    GERD (gastroesophageal reflux disease)    Hypertension    IBS (irritable bowel syndrome)    Lactose intolerance    Palpitation    Palpitations    Pre-diabetes    Vitamin D  deficiency disease 06/23/2019    SURGICAL HISTORY: Past Surgical History:  Procedure Laterality Date   ABDOMINAL HYSTERECTOMY     ovaries remained    ABDOMINAL HYSTERECTOMY  BREAST BIOPSY Right 05/30/2024   US  RT BREAST BX W LOC DEV 1ST LESION IMG BX SPEC US  GUIDE 05/30/2024 [facility]   BREAST BIOPSY  06/24/2024   US  RT RADIOACTIVE SEED LOC 06/24/2024 [facility]   BREAST LUMPECTOMY WITH RADIOACTIVE SEED LOCALIZATION Right 06/28/2024   Procedure: BREAST LUMPECTOMY WITH RADIOACTIVE SEED LOCALIZATION;  Surgeon: Vanderbilt Ned, MD;  Location: [facility];  Service: General;  Laterality: Right;  RIGHT BREAST SEED LUMPECTOMY   CHOLECYSTECTOMY     COLONOSCOPY  2010   COLONOSCOPY WITH PROPOFOL  N/A 05/21/2018   Procedure: COLONOSCOPY WITH PROPOFOL ;  Surgeon: Avram Lupita BRAVO, MD;  Location: [facility];  Service: Endoscopy;  Laterality: N/A;  with FMT   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   Procedure: FECAL TRANSPLANT;  Surgeon: Avram Lupita BRAVO, MD;  Location: [facility];  Service: Endoscopy;;   KNEE ARTHROSCOPY Right    Precancerous  Tissue - Nose  10/15/2012    I have reviewed the social history and family history with the patient and they are unchanged from previous note.  ALLERGIES:  is allergic to hydrocodone and prednisone.  MEDICATIONS:  Current Outpatient Medications  Medication Sig Dispense Refill   tamoxifen  (NOLVADEX ) 20 MG tablet Take 1 tablet (20 mg total) by mouth daily. 30 tablet 2   Cholecalciferol (VITAMIN D -3) 125 MCG (5000 UT) TABS Take 1 tablet by mouth daily.     famotidine  (PEPCID ) 20 MG tablet Take 1 tablet (20 mg total) by mouth at bedtime. (Patient taking differently: Take 20 mg by mouth as needed.) 90 tablet 3   fluticasone  (FLONASE ) 50 MCG/ACT nasal spray PLACE 2 SPRAYS IN BOTH NOSTRILS DAILY. (Patient taking differently: as needed.) 16 g 3   lipase/protease/amylase (CREON ) 36000 UNITS CPEP capsule Take 1 capsule (36,000 Units total) by mouth 3 (three) times daily before meals.     loperamide  (IMODIUM  A-D) 2 MG tablet Take 0.5 tablets (1 mg total) by mouth 3 (three) times daily as needed  for diarrhea or loose stools. Can take before each meal if needed. (Patient taking differently: Take 1 mg by mouth as needed. Can take before each meal if needed.) 90 tablet 1   meclizine  (ANTIVERT ) 12.5 MG tablet Take 1 tablet (12.5 mg total) by mouth 3 (three) times daily as needed for dizziness. 30 tablet 2   OVER THE COUNTER MEDICATION Essential Enzymes 500 mg 1/2 daily prn     propranolol  (INDERAL ) 60 MG tablet Take 1 tablet (60 mg total) by mouth 2 (two) times daily. (Patient taking differently: Take 60 mg by mouth as needed.) 60 tablet 3   No current facility-administered medications for this visit.    PHYSICAL EXAMINATION: Not performed   LABORATORY DATA:  I have reviewed the data as listed    Latest Ref Rng & Units 06/21/2024   12:09 PM 12/18/2023    2:40 AM 05/17/2020    6:16 PM  CBC  WBC 4.0 - 10.5 K/uL 8.9  7.2  9.2   Hemoglobin 12.0 - 15.0 g/dL 83.9  84.3  84.1   Hematocrit 36.0 - 46.0 % 47.5  48.4  49.4   Platelets 150 - 400 K/uL 248  251  275         Latest Ref Rng & Units 06/21/2024   12:09 PM 12/18/2023    2:40 AM 11/19/2020    3:07 PM  CMP  Glucose 70 - 99 mg/dL 99  882  81   BUN 8 - 23 mg/dL 23  21  28   Creatinine 0.44 - 1.00 mg/dL 9.14  9.29  9.15   Sodium 135 - 145 mmol/L 140  137  142   Potassium 3.5 - 5.1 mmol/L 4.4  3.8  4.3   Chloride 98 - 111 mmol/L 104  105  105   CO2 22 - 32 mmol/L 32  26  27   Calcium  8.9 - 10.3 mg/dL 9.7  8.9  9.1   Total Protein 6.5 - 8.1 g/dL 6.7   6.3   Total Bilirubin 0.0 - 1.2 mg/dL 0.7   0.5   Alkaline Phos 38 - 126 U/L 75     AST 15 - 41 U/L 17   17   ALT 0 - 44 U/L 13   15       RADIOGRAPHIC STUDIES: I have personally reviewed the radiological images as listed and agreed with the findings in the report. No results found.     I discussed the assessment and treatment plan with the patient. The patient was provided an opportunity to ask questions and all were answered. The patient agreed with the plan and  demonstrated an understanding of the instructions.   The patient was advised to call back or seek an in-person evaluation if the symptoms worsen or if the condition fails to improve as anticipated.  I provided 25 minutes of non face-to-face telephone visit time during this encounter, including review of chart and various tests results, discussions about plan of care and coordination of care plan.    Onita Mattock, MD 08/19/2024      "

## 2024-08-23 ENCOUNTER — Other Ambulatory Visit: Payer: Self-pay

## 2024-08-23 ENCOUNTER — Ambulatory Visit
Admission: RE | Admit: 2024-08-23 | Discharge: 2024-08-23 | Disposition: A | Discharge status: Home / Self Care | Source: Ambulatory Visit | Attending: Radiation Oncology | Admitting: Radiation Oncology

## 2024-08-23 DIAGNOSIS — C50411 Malignant neoplasm of upper-outer quadrant of right female breast: Secondary | ICD-10-CM | POA: Diagnosis not present

## 2024-08-23 LAB — RAD ONC ARIA SESSION SUMMARY
Course Elapsed Days: 21
Plan Fractions Treated to Date: 4
Plan Prescribed Dose Per Fraction: 5.7 Gy
Plan Total Fractions Prescribed: 5
Plan Total Prescribed Dose: 28.5 Gy
Reference Point Dosage Given to Date: 22.8 Gy
Reference Point Session Dosage Given: 5.7 Gy
Session Number: 4

## 2024-08-30 ENCOUNTER — Ambulatory Visit
Admission: RE | Admit: 2024-08-30 | Discharge: 2024-08-30 | Disposition: A | Discharge status: Home / Self Care | Source: Ambulatory Visit | Attending: Radiation Oncology | Admitting: Radiation Oncology

## 2024-08-30 ENCOUNTER — Other Ambulatory Visit: Payer: Self-pay

## 2024-08-30 DIAGNOSIS — C50411 Malignant neoplasm of upper-outer quadrant of right female breast: Secondary | ICD-10-CM | POA: Diagnosis not present

## 2024-08-30 LAB — RAD ONC ARIA SESSION SUMMARY
Course Elapsed Days: 28
Plan Fractions Treated to Date: 5
Plan Prescribed Dose Per Fraction: 5.7 Gy
Plan Total Fractions Prescribed: 5
Plan Total Prescribed Dose: 28.5 Gy
Reference Point Dosage Given to Date: 28.5 Gy
Reference Point Session Dosage Given: 5.7 Gy
Session Number: 5

## 2024-08-31 NOTE — Radiation Completion Notes (Addendum)
" °  Radiation Oncology         [phone] ________________________________  Name: LUREE PALLA MRN: [id number]  Date of Service: 08/30/2024  DOB: [birth year]  End of Treatment Note   Diagnosis: Stage IA, pT2,cN0M0 grade 2, ER/PR positive invasive ductal carcinoma of the right breast.   Intent: Curative     ==========DELIVERED PLANS==========  First Treatment Date: 2024-08-02 Last Treatment Date: 2024-08-30   Plan Name: Breast_R Site: Breast, Right Technique: 3D Mode: Photon Dose Per Fraction: 5.7 Gy Prescribed Dose (Delivered / Prescribed): 28.5 Gy / 28.5 Gy Prescribed Fxs (Delivered / Prescribed): 5 / 5     ==========ON TREATMENT VISIT DATES========== 2024-08-30    See weekly On Treatment Notes in Epic for details in the Media tab (listed as Progress notes on the On Treatment Visit Dates listed above).  The patient tolerated radiation. She developed fatigue and anticipated skin changes in the treatment field.   The patient will receive a call in about one month from the radiation oncology department. She will continue follow up with Dr. Lanny as well.      Donald KYM Husband, PAC     "

## 2024-09-06 ENCOUNTER — Encounter (INDEPENDENT_AMBULATORY_CARE_PROVIDER_SITE_OTHER): Payer: Self-pay | Admitting: Gastroenterology

## 2024-09-06 ENCOUNTER — Ambulatory Visit (INDEPENDENT_AMBULATORY_CARE_PROVIDER_SITE_OTHER): Admitting: Gastroenterology

## 2024-09-06 VITALS — BP 141/84 | HR 85 | Temp 96.7°F | Ht 65.0 in | Wt 137.4 lb

## 2024-09-06 DIAGNOSIS — K591 Functional diarrhea: Secondary | ICD-10-CM

## 2024-09-06 DIAGNOSIS — K580 Irritable bowel syndrome with diarrhea: Secondary | ICD-10-CM

## 2024-09-06 DIAGNOSIS — K529 Noninfective gastroenteritis and colitis, unspecified: Secondary | ICD-10-CM | POA: Diagnosis not present

## 2024-09-06 MED ORDER — AMITRIPTYLINE HCL 10 MG PO TABS: 10.0000 mg | ORAL_TABLET | Freq: Every day | ORAL | 1 refills | Status: AC

## 2024-09-06 NOTE — Progress Notes (Signed)
 "  Referring Provider: [name], MD Primary Care Physician:  [name], MD Primary GI Physician: Previously Dr. Eartha (Dr. Cinderella)  Chief Complaint  Patient presents with   Follow-up    Pt arrives for follow up. Pt is still having diarrhea. Could not tell any difference with Creon . Imodium  is the only medication that seems to help patient.    HPI:   Rhonda Andrews is a 84 y.o. female with past medical history of recurrent C.diff s/p fecal transplant, GERD, IBS-D, anxiety, HTN, Pre diabetes, and colitis, most recently diagnosed with estrogen receptor positive R breast cancer pT2N0M0, stage IA, ER+/PR+/HER2-, G2  Patient presenting today for:  Follow up of chronic diarrhea  Last seen October, at that time having a lot of ongoing stress, felt this influenced her diarrhea, havign 1-6 BMs per day, solid to watery, taking imodium  PRN, avoiding certain foods which tend to make things worse   Recommended creon  samples, discuss anxiety with PCP, consider low dose buspar for functional symptoms  Present:  States diarrhea seems a bit better recently. Did have a lumpectomy and has that behind her which seems to have eased her mind some however there were plans to start tamoixfen in the future but she is unsure if she wants to start due to potential side effects including diarrhea. She is planning to discuss side effects of the med with her oncologist.  BMs can range 1-5 per day. Consistency can be watery to solid. She states she can sometimes go up to three months without having diarrhea. Tends to go through phases where symptoms are worse than others. No abdominal pain, rectal bleeding or melena. Did not feel that creon  helped at all. Taking imodium  PRN. Still has a lot of stress at home caring for her husband with dementia    Previous workup : SIBO breath test in nov 2024 normal (reports previous history of positive SIBO testing, treated with xifaxan  at [facility]) Had sucrose  deficiency testing 07/2023 which was negative.   Celiac disease panel negative 05/2023  No improvement with trial of xifaxan  in the April 2024 No improvement with cholestyramine  No improvement with creon  06/2024 Alpha gal negative 05/2021 No improvement with plant enzymes or probiotics   Last Colonoscopy:2019 The perianal and digital rectal examinations were normal. A few diverticula were found in the sigmoid colon. The terminal ileum appeared normal. The exam was otherwise without abnormality on direct and retroflexion views.   Fecal Microbiota Transplant (Bacteriotherapy): Donor stool was prepared by a third party (purchased) as per protocol. Approximately 450 mL of the emulsified donor stool was instilled in the terminal ileum. A detailed colonoscopic detailed colonoscopic exam could not be performed upon scope withdrawal secondary to limited visibility from the instilled stool.   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)    GERD (gastroesophageal reflux disease)    Hypertension    IBS (irritable bowel syndrome)    Lactose intolerance    Palpitation    Palpitations    Pre-diabetes    Vitamin D  deficiency disease 06/23/2019    Past Surgical History:  Procedure Laterality Date   ABDOMINAL HYSTERECTOMY     ovaries remained   ABDOMINAL  HYSTERECTOMY     BREAST BIOPSY Right 05/30/2024   US  RT BREAST BX W LOC DEV 1ST LESION IMG BX SPEC US  GUIDE 05/30/2024 [facility]   BREAST BIOPSY  06/24/2024   US  RT RADIOACTIVE SEED LOC 06/24/2024 [facility]   BREAST LUMPECTOMY WITH RADIOACTIVE SEED LOCALIZATION Right 06/28/2024   Procedure: BREAST LUMPECTOMY WITH RADIOACTIVE SEED LOCALIZATION;  Surgeon: Vanderbilt Ned, MD;  Location:  [facility];  Service: General;  Laterality: Right;  RIGHT BREAST SEED LUMPECTOMY   CHOLECYSTECTOMY     COLONOSCOPY  2010   COLONOSCOPY WITH PROPOFOL  N/A 05/21/2018   Procedure: COLONOSCOPY WITH PROPOFOL ;  Surgeon: Avram Lupita BRAVO, MD;  Location: [facility];  Service: Endoscopy;  Laterality: N/A;  with FMT   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   Procedure: FECAL TRANSPLANT;  Surgeon: Avram Lupita BRAVO, MD;  Location: [facility];  Service: Endoscopy;;   KNEE ARTHROSCOPY Right    Precancerous  Tissue - Nose  10/15/2012    Current Outpatient Medications  Medication Sig Dispense Refill   Cholecalciferol (VITAMIN D -3) 125 MCG (5000 UT) TABS Take 1 tablet by mouth daily.     famotidine  (PEPCID ) 20 MG tablet Take 1 tablet (20 mg total) by mouth at bedtime. (Patient taking differently: Take 20 mg by mouth as needed.) 90 tablet 3   fluticasone  (FLONASE ) 50 MCG/ACT nasal spray PLACE 2 SPRAYS IN BOTH NOSTRILS DAILY. (Patient taking differently: as needed.) 16 g 3   loperamide  (IMODIUM  A-D) 2 MG tablet Take 0.5 tablets (1 mg total) by mouth 3 (three) times daily as needed for diarrhea or loose stools. Can take before each meal if needed. (Patient taking differently: Take 1 mg by mouth as needed. Can take before each meal if needed.) 90 tablet 1   meclizine  (ANTIVERT ) 12.5 MG tablet Take 1 tablet (12.5 mg total) by mouth 3 (three) times daily as needed for dizziness. 30 tablet 2   OVER THE COUNTER MEDICATION Essential Enzymes 500 mg 1/2 daily prn     propranolol  (INDERAL ) 60 MG tablet Take 1 tablet (60 mg total) by mouth 2 (two) times daily. (Patient taking differently: Take 60 mg by mouth as needed.) 60 tablet 3   lipase/protease/amylase (CREON ) 36000 UNITS CPEP capsule Take 1 capsule (36,000 Units total) by mouth 3 (three) times daily before meals. (Patient not taking: Reported on 09/06/2024)     tamoxifen  (NOLVADEX ) 20 MG tablet Take 1 tablet (20 mg total) by mouth daily. (Patient not taking:  Reported on 09/06/2024) 30 tablet 2   No current facility-administered medications for this visit.    Allergies as of 09/06/2024 - Review Complete 09/06/2024  Allergen Reaction Noted   Hydrocodone Nausea And Vomiting 05/19/2018   Prednisone Other (See Comments) 10/28/2014    Social History   Socioeconomic History   Marital status: Married    Spouse name: Not on file   Number of children: 1   Years of education: Not on file   Highest education level: Some college, no degree  Occupational History   Not on file  Tobacco Use   Smoking status: Never   Smokeless tobacco: Never  Vaping Use   Vaping status: Never Used  Substance and Sexual Activity   Alcohol use: No    Alcohol/week: 0.0 standard drinks of alcohol   Drug use: No   Sexual activity: Not Currently    Birth control/protection: Surgical    Comment: hyst  Other Topics Concern   Not on file  Social History Narrative  Married. Has a daughter. 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   Eats all food groups and has a great appetite.    Walks in house daily in her house, 1.5 miles a day.   Right handed   Quit drinking caffeine 15 years ago   No EtOH/tobacco   Social Drivers of Health   Tobacco Use: Low Risk (09/06/2024)   Patient History    Smoking Tobacco Use: Never    Smokeless Tobacco Use: Never    Passive Exposure: Not on file  Financial Resource Strain: Not on file  Food Insecurity: No Food Insecurity (07/21/2024)   Epic    Worried About Programme Researcher, Broadcasting/film/video in the Last Year: Never true    Ran Out of Food in the Last Year: Never true  Transportation Needs: No Transportation Needs (07/21/2024)   Epic    Lack of Transportation (Medical): No    Lack of Transportation (Non-Medical): No  Physical Activity: Not on file  Stress: Not on file  Social Connections: Not on file  Depression (PHQ2-9): Low Risk (07/21/2024)   Depression (PHQ2-9)    PHQ-2  Score: 0  Alcohol Screen: Not on file  Housing: Low Risk (07/21/2024)   Epic    Unable to Pay for Housing in the Last Year: No    Number of Times Moved in the Last Year: 0    Homeless in the Last Year: No  Utilities: Not At Risk (07/21/2024)   Epic    Threatened with loss of utilities: No  Health Literacy: Not on file    Review of systems General: negative for malaise, night sweats, fever, chills, weight loss Neck: Negative for lumps, goiter, pain and significant neck swelling Resp: Negative for cough, wheezing, dyspnea at rest CV: Negative for chest pain, leg swelling, palpitations, orthopnea GI: denies melena, hematochezia, nausea, vomiting, constipation, dysphagia, odyonophagia, early satiety or unintentional weight loss. +intermittent diarrhea  MSK: Negative for joint pain or swelling, back pain, and muscle pain. Derm: Negative for itching or rash Psych: Denies depression, anxiety, memory loss, confusion. No homicidal or suicidal ideation.  Heme: Negative for prolonged bleeding, bruising easily, and swollen nodes. Endocrine: Negative for cold or heat intolerance, polyuria, polydipsia and goiter. Neuro: negative for tremor, gait imbalance, syncope and seizures. The remainder of the review of systems is noncontributory.  Physical Exam: BP (!) 141/84   Pulse 85   Temp (!) 96.7 F (35.9 C)   Ht 5' 5 (1.651 m)   Wt 137 lb 6.4 oz (62.3 kg)   BMI 22.86 kg/m  General:   Alert and oriented. No distress noted. Pleasant and cooperative.  Head:  Normocephalic and atraumatic. Eyes:  Conjuctiva clear without scleral icterus. Mouth:  Oral mucosa pink and moist. Good dentition. No lesions. Heart: Normal rate and rhythm, s1 and s2 heart sounds present.  Lungs: Clear lung sounds in all lobes. Respirations equal and unlabored. Abdomen:  +BS, soft, non-tender and non-distended. No rebound or guarding. No HSM or masses noted. Derm: No palmar erythema or jaundice Msk:  Symmetrical without  gross deformities. Normal posture. Extremities:  Without edema. Neurologic:  Alert and  oriented x4 Psych:  Alert and cooperative. Normal mood and affect.  Invalid input(s): 6 MONTHS   ASSESSMENT: DORIANNE PERRET is a 84 y.o. female presenting today for follow up of chronic diarrhea  Patient with long history of diarrhea with extensive testing as above. Has failed multiple therapies in the  past, currently taking imodium . She endorses a lot of anxiety with caring for husband with alzheimers and recent diagnosis breast cancer. She feels anxiety is really driving her symptoms which I agree with. At this time, I am inclined to try very low dose TCA given it seems anxiety is likely the main driver of patient's symptoms. We will start with 10mg  of elavil  nightly. I discussed potential side effects and she should make me aware if she experiences any of these.   PLAN:  -start elavil  10mg  nightly -try to avoid dietary triggers -pt to make me aware of any side effects of TCA  All questions were answered, patient verbalized understanding and is in agreement with plan as outlined above.   Follow Up: 1 month   Willmer Fellers L. Alphonsine Minium, MSN, APRN, AGNP-C Adult-Gerontology Nurse Practitioner [facility]  "

## 2024-09-06 NOTE — Patient Instructions (Signed)
 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 I want to create trusting relationships with patients and provide genuine, compassionate, and quality care. I truly value your feedback! please be on the lookout for a survey regarding your visit with me today. I appreciate your input about our visit and your time in completing this!    Candy Ziegler L. Danilyn Cocke, MSN, APRN, AGNP-C Adult-Gerontology Nurse Practitioner [facility]

## 2024-09-19 NOTE — Progress Notes (Signed)
" °  Radiation Oncology         [phone] ________________________________  Name: Rhonda Andrews MRN: [id number]  Date of Service: 09/26/2024  DOB: Jan 13, 1941  Post Treatment Telephone Note  Diagnosis:  Stage IA, pT2,cN0M0 grade 2, ER/PR positive invasive ductal carcinoma of the right breast.   First Treatment Date: 2024-08-02 Last Treatment Date: 2024-08-30   Plan Name: Breast_R Site: Breast, Right Technique: 3D Mode: Photon Dose Per Fraction: 5.7 Gy Prescribed Dose (Delivered / Prescribed): 28.5 Gy / 28.5 Gy Prescribed Fxs (Delivered / Prescribed): 5 / 5  The patient was available for call today.   Symptoms of fatigue have improved since completing therapy.  Symptoms of skin changes have improved since completing therapy.  The patient was encouraged to avoid sun exposure in the area of prior treatment for up to one year following radiation with either sunscreen or by the style of clothing worn in the sun.  The patient has scheduled follow up with her medical oncologist Dr. Lanny for ongoing surveillance, and was encouraged to call if she develops concerns or questions regarding radiation.   "

## 2024-09-26 ENCOUNTER — Ambulatory Visit
Admission: RE | Admit: 2024-09-26 | Discharge: 2024-09-26 | Disposition: A | Discharge status: Home / Self Care | Source: Ambulatory Visit | Attending: Radiation Oncology | Admitting: Radiation Oncology

## 2024-09-26 DIAGNOSIS — Z170 Estrogen receptor positive status [ER+]: Secondary | ICD-10-CM

## 2024-10-05 ENCOUNTER — Other Ambulatory Visit: Payer: Self-pay | Admitting: *Deleted

## 2024-10-05 ENCOUNTER — Telehealth: Payer: Self-pay | Admitting: *Deleted

## 2024-10-06 NOTE — Telephone Encounter (Signed)
 Open in error

## 2024-10-07 ENCOUNTER — Ambulatory Visit: Admitting: Cardiology

## 2024-10-18 ENCOUNTER — Ambulatory Visit (INDEPENDENT_AMBULATORY_CARE_PROVIDER_SITE_OTHER): Admitting: Gastroenterology

## 2024-11-25 ENCOUNTER — Inpatient Hospital Stay: Admitting: Adult Health

## 2025-02-16 ENCOUNTER — Inpatient Hospital Stay: Admitting: Hematology

## 2025-02-16 ENCOUNTER — Inpatient Hospital Stay
